# Patient Record
Sex: Female | Born: 1975 | Race: White | Hispanic: No | Marital: Married | Smoking: Never smoker
Health system: Southern US, Community
[De-identification: ages and names within clinical notes are randomized; demographics above are authoritative.]

## PROBLEM LIST (undated history)

## (undated) DIAGNOSIS — H579 Unspecified disorder of eye and adnexa: Secondary | ICD-10-CM

## (undated) DIAGNOSIS — E785 Hyperlipidemia, unspecified: Secondary | ICD-10-CM

## (undated) DIAGNOSIS — D649 Anemia, unspecified: Secondary | ICD-10-CM

## (undated) DIAGNOSIS — W540XXA Bitten by dog, initial encounter: Secondary | ICD-10-CM

## (undated) DIAGNOSIS — R202 Paresthesia of skin: Secondary | ICD-10-CM

## (undated) DIAGNOSIS — R002 Palpitations: Secondary | ICD-10-CM

## (undated) DIAGNOSIS — Z0000 Encounter for general adult medical examination without abnormal findings: Principal | ICD-10-CM

## (undated) DIAGNOSIS — M5090 Cervical disc disorder, unspecified, unspecified cervical region: Secondary | ICD-10-CM

## (undated) DIAGNOSIS — R739 Hyperglycemia, unspecified: Secondary | ICD-10-CM

## (undated) DIAGNOSIS — O24419 Gestational diabetes mellitus in pregnancy, unspecified control: Secondary | ICD-10-CM

## (undated) HISTORY — DX: Bitten by dog, initial encounter: W54.0XXA

## (undated) HISTORY — DX: Paresthesia of skin: R20.2

## (undated) HISTORY — DX: Hyperlipidemia, unspecified: E78.5

## (undated) HISTORY — PX: WISDOM TOOTH EXTRACTION: SHX21

## (undated) HISTORY — DX: Cervical disc disorder, unspecified, unspecified cervical region: M50.90

## (undated) HISTORY — DX: Hyperglycemia, unspecified: R73.9

## (undated) HISTORY — DX: Anemia, unspecified: D64.9

## (undated) HISTORY — DX: Gestational diabetes mellitus in pregnancy, unspecified control: O24.419

## (undated) HISTORY — DX: Palpitations: R00.2

## (undated) HISTORY — DX: Unspecified disorder of eye and adnexa: H57.9

## (undated) HISTORY — DX: Encounter for general adult medical examination without abnormal findings: Z00.00

---

## 2005-11-28 ENCOUNTER — Emergency Department (HOSPITAL_COMMUNITY): Admission: EM | Admit: 2005-11-28 | Discharge: 2005-11-28 | Payer: Self-pay | Admitting: Family Medicine

## 2006-01-12 ENCOUNTER — Inpatient Hospital Stay (HOSPITAL_COMMUNITY): Admission: AD | Admit: 2006-01-12 | Discharge: 2006-01-12 | Payer: Self-pay | Admitting: Obstetrics and Gynecology

## 2006-01-16 ENCOUNTER — Inpatient Hospital Stay (HOSPITAL_COMMUNITY): Admission: RE | Admit: 2006-01-16 | Discharge: 2006-01-19 | Payer: Self-pay | Admitting: Obstetrics and Gynecology

## 2007-06-28 DIAGNOSIS — O24419 Gestational diabetes mellitus in pregnancy, unspecified control: Secondary | ICD-10-CM

## 2007-06-28 HISTORY — DX: Gestational diabetes mellitus in pregnancy, unspecified control: O24.419

## 2007-11-27 ENCOUNTER — Encounter: Admission: RE | Admit: 2007-11-27 | Discharge: 2007-11-27 | Payer: Self-pay | Admitting: Certified Nurse Midwife

## 2008-02-22 ENCOUNTER — Inpatient Hospital Stay (HOSPITAL_COMMUNITY): Admission: AD | Admit: 2008-02-22 | Discharge: 2008-02-24 | Payer: Self-pay | Admitting: Obstetrics and Gynecology

## 2008-07-28 LAB — CONVERTED CEMR LAB: Pap Smear: NORMAL

## 2008-10-28 ENCOUNTER — Ambulatory Visit: Payer: Self-pay | Admitting: Internal Medicine

## 2008-10-28 DIAGNOSIS — J301 Allergic rhinitis due to pollen: Secondary | ICD-10-CM | POA: Insufficient documentation

## 2008-10-28 DIAGNOSIS — O99810 Abnormal glucose complicating pregnancy: Secondary | ICD-10-CM | POA: Insufficient documentation

## 2008-10-28 DIAGNOSIS — Z8742 Personal history of other diseases of the female genital tract: Secondary | ICD-10-CM | POA: Insufficient documentation

## 2008-10-28 DIAGNOSIS — A630 Anogenital (venereal) warts: Secondary | ICD-10-CM | POA: Insufficient documentation

## 2008-10-29 ENCOUNTER — Encounter (INDEPENDENT_AMBULATORY_CARE_PROVIDER_SITE_OTHER): Payer: Self-pay | Admitting: *Deleted

## 2008-10-29 LAB — CONVERTED CEMR LAB
Hgb A1c MFr Bld: 5.6 % (ref 4.6–6.5)
Triglycerides: 30 mg/dL (ref 0.0–149.0)
VLDL: 6 mg/dL (ref 0.0–40.0)

## 2008-12-15 ENCOUNTER — Telehealth (INDEPENDENT_AMBULATORY_CARE_PROVIDER_SITE_OTHER): Payer: Self-pay | Admitting: *Deleted

## 2009-01-06 ENCOUNTER — Telehealth: Payer: Self-pay | Admitting: Internal Medicine

## 2009-01-30 ENCOUNTER — Ambulatory Visit: Payer: Self-pay | Admitting: Internal Medicine

## 2009-01-30 ENCOUNTER — Telehealth (INDEPENDENT_AMBULATORY_CARE_PROVIDER_SITE_OTHER): Payer: Self-pay | Admitting: *Deleted

## 2009-01-30 ENCOUNTER — Telehealth: Payer: Self-pay | Admitting: Infectious Diseases

## 2009-01-30 DIAGNOSIS — L0390 Cellulitis, unspecified: Secondary | ICD-10-CM

## 2009-01-30 DIAGNOSIS — L0291 Cutaneous abscess, unspecified: Secondary | ICD-10-CM | POA: Insufficient documentation

## 2009-01-30 DIAGNOSIS — A4902 Methicillin resistant Staphylococcus aureus infection, unspecified site: Secondary | ICD-10-CM | POA: Insufficient documentation

## 2009-02-02 ENCOUNTER — Ambulatory Visit: Payer: Self-pay | Admitting: Infectious Diseases

## 2010-11-09 NOTE — H&P (Signed)
Sandra Long, Sandra Long                 ACCOUNT NO.:  [id number]   MEDICAL RECORD NO.:  [id number]          PATIENT TYPE:  INP   LOCATION:  9115                          FACILITY:  [facility]   PHYSICIAN:  Lenoard Aden, M.D.DATE OF BIRTH:  March 15, 1976   DATE OF ADMISSION:  02/22/2008  DATE OF DISCHARGE:                              HISTORY & PHYSICAL   CHIEF COMPLAINT:  Labor.   She is a 31-year white female G2, P1 history of previous C-section  breech, who presents with active contractions and cervical change.   Medications include Valtrex and prenatal vitamins.   She is a nonsmoker and nondrinker.  She denies domestic or physical  violence.   She has no known drug allergies.   Family history of hypertension, diabetes, anemia, myeloma, lung cancer,  bladder cancer, and osteoarthritis.  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   Previous history of C-section at 39 weeks planned for a 7 pounds 15  ounces baby due to breech presentation.   PHYSICAL EXAMINATION:  GENERAL:  She is a well-developed, well-nourished  white female in a moderate amount of distress.  HEENT:  Normal.  LUNGS:  Clear.  HEART:  Regular rhythm.  ABDOMEN:  Soft, gravid and nontender.  Estimated fetal weight 8 pounds.  Cervix is 4-5 cm 100% vertex and 0.  EXTREMITIES:  No cords.  NEUROLOGIC:  Nonfocal.  SKIN:  Intact.   Fetal heart rate tracing is reactive, contractions every 2-4 minutes.   IMPRESSION:  1. 40-week intrauterine pregnancy.  2. Previous C-section for trial of labor.  3. 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  4. GBS positive.  5. Fetal arrhythmia for followup postpartum.   PLAN:  Epidural as needed.  Anticipated attempts at vaginal delivery.  Risks, benefits of trial of labor, versus repeat C-section discussed.  Small risk of uterine rupture in labor with possible need  for Pitocin  augmentation.  Note, the patient acknowledged and wishes to proceed.      Lenoard Aden, M.D.  Electronically Signed     RJT/MEDQ  D:  02/22/2008  T:  02/23/2008  Job:  [id number]

## 2010-11-12 NOTE — Discharge Summary (Signed)
Sandra Long, Sandra Long                 ACCOUNT NO.:  [id number]   MEDICAL RECORD NO.:  [id number]          PATIENT TYPE:  INP   LOCATION:  9104                          FACILITY:  [facility]   PHYSICIAN:  Richardean Sale, M.D.   DATE OF BIRTH:  05-29-1976   DATE OF ADMISSION:  01/16/2006  DATE OF DISCHARGE:  01/19/2006                                 DISCHARGE SUMMARY   ADMITTING DIAGNOSIS:  Is 39 week intrauterine pregnancy is frank breech  presentation for primary cesarean delivery.   DISCHARGE DIAGNOSES:  The same, status post cesarean section.   OPERATIVE FINDINGS:  Viable female infant frank breech presentation.  Apgar's  of 9 and 9.  Intact placenta with three-vessel cord.   HOSPITAL COURSE/HISTORY OF PRESENT ILLNESS:  Please see admission history  and physical for details.  Briefly, this is 35 year old gravida 1, para 0  white female who is at 82 weeks plus gestation with a frank breech  presentation.  The patient had been counseled on possibility of external  cephalic version but declined. She underwent uncomplicated cesarean delivery  on 01/16/06 resulting in delivery of viable female infant.  Her postoperative  course was unremarkable.  The patient remained afebrile throughout her  hospitalization. On postop day #3 she was ambulating well.  She was  tolerating a regular diet.  Her pain was controlled with pain medication and  she was subsequently discharged home in good condition.   DISPOSITION:  To home.   CONDITION:  Improved.   FOLLOW UP:  The patient will follow up in 4 weeks for a routine postop  visit.   MEDICATIONS:  1. Percocet 1-2 tabs p.o. q. 4-6 hours p.r.n. pain.  2. Ibuprofen 800 mg p.o. q. 8 h as needed.   LABORATORY STUDIES:  Preop white count 10.3, hemoglobin 12.1, hematocrit  35.3, platelets 201.  Postop day #1, white count 14.9, hemoglobin 11.2,  hematocrit 32.7 and platelets 197.      Richardean Sale, M.D.  Electronically Signed     JW/MEDQ  D:   03/25/2006  T:  03/27/2006  Job:  [id number]

## 2010-11-12 NOTE — Op Note (Signed)
Sandra Long, Sandra Long                 ACCOUNT NO.:  [id number]   MEDICAL RECORD NO.:  [id number]          PATIENT TYPE:  INP   LOCATION:  9104                          FACILITY:  [facility]   PHYSICIAN:  Richardean Sale, M.D.   DATE OF BIRTH:  [birth year]   DATE OF PROCEDURE:  01/16/2006  DATE OF DISCHARGE:                                 OPERATIVE REPORT   PREOPERATIVE DIAGNOSIS:  A 39 week intrauterine pregnancy, frank breech  presentation for primary cesarean delivery.   POSTOPERATIVE DIAGNOSIS:  A 39 week intrauterine pregnancy, frank breech  presentation for primary cesarean delivery.   PROCEDURE:  Primary low transverse cesarean section.   SURGEON:  Richardean Sale, M.D.   ASSISTANT:  Marlinda Mike, C.N.M.   ANESTHESIA:  Spinal   COMPLICATIONS:  None.   ESTIMATED BLOOD LOSS:  600 mL.   FINDINGS:  A viable female infant in frank breech presentation.  Apgars of 9  and 9.  Arterial cord pH 7.27, and intact placenta with 3-vessel cord  delivered spontaneously.  Normal-appearing uterus, tubes, and ovaries.   INDICATIONS:  This is a 2 gravida 1, para 0 white female who is at [redacted] weeks  gestation with a known frank breech presentation who present today for  primary cesarean delivery.  Prior to the procedure the risks, benefits, and  alternatives of the procedure including external cephalic version were  reviewed with the patient in detail.  We discussed the risks which include  but are not limited to hemorrhage requiring transfusion, infection, injury  to the bowel, bladder, or other organs which could require additional  surgery.  The possibility of anesthetic related complications including DVT.  The patient voiced understanding of all of the above and desires to proceed.  Informed consent was then obtained; and all questions answered.   DESCRIPTION OF PROCEDURE:  The patient was taken to the operating room where  she was given a spinal anesthetic.  She was then prepped and draped in  the  usual sterile fashion with Betadine and a Foley catheter was placed 10 mL of  1/2% plain Marcaine were then injected into the area where the incision was  to be made.  A Pfannenstiel skin incision was made with the scalpel.  This  was carried down sharply to the fascia.  The fascia was then incised in the  midline.  The fascial incision was extended laterally with Metzenbaum  scissors.  The superior and inferior aspects of the fascial incision were  then grasped with Kocher clamps, elevated, and the underlying rectus muscles  dissected off with both sharp and blunt dissection.  The muscles were then  separated in the midline.  The peritoneum was identified and entered  sharply.  Peritoneal incision was then extended superiorly and inferiorly  with good visualization of the bladder.  The bladder blade was then  reinserted and the vesicouterine peritoneum was identified; grasped with  pickups, and entered sharply with Metzenbaum scissors.  The bladder flap was  then created with both sharp and blunt dissection.  The bladder blade was  then reinserted and the lower  uterine segment was incised in a transverse  fashion with the scalpel.  Clear amniotic fluid was noted.   The position was frank breech presentation.  The breech was delivered.  The  sacrum was then rotated anteriorly.  The legs were delivered in a flexed  fashion.  The arms were then delivered by sweeping them across the chest;  and the head was then delivered with a finger in the mouth in a flexed  position.  The nose and mouth were then suctioned with the bulb; and the  cord was clamped and cut and the infant was handed off to the waiting NICU  attendants with a vigorous cry.  Apgars were 9 and 9.  Arterial cord gas was  then obtained, cord blood was obtained, and the uterus was then delivered  spontaneously, intact and sent to labor and delivery.  The uterus was then  cleared of all clot and debris; and the uterine  incision was repaired with  running 1-0 chromic in a running locked fashion.  A second layer of the same  suture was then placed in an imbricating fashion for additional hemostasis;  and in the event that the patient would attempt labor in the future.   Once the uterine incision was confirmed as hemostatic, the adnexa were  examined and were normal.  The pelvis was then irrigated copiously with warm  normal saline.  Any areas of bleeding were cauterized with a Bovie.  The  peritoneum was reapproximated using a running chromic suture.  The rectus  muscles and the subfascial surfaces were inspected and any areas of bleeding  were cauterized with the Bovie.  The fascia was then closed with running  Vicryl suture.  The subcutaneous space was then irrigated, again, and any  areas of bleeding were cauterized with a Bovie; and the skin was then closed  with a running 4-0 Vicryl subcuticular stitch, per patient request.  At this  point the procedure was then terminated.  All sponge, lap, needle, and  sponge counts were correct x2.  The patient was taken to recovery room awake  and in stable condition; and there were no complications.      Richardean Sale, M.D.  Electronically Signed     JW/MEDQ  D:  01/16/2006  T:  01/16/2006  Job:  [id number]

## 2012-10-22 ENCOUNTER — Other Ambulatory Visit (HOSPITAL_COMMUNITY): Payer: Self-pay | Admitting: Neurological Surgery

## 2012-11-01 ENCOUNTER — Other Ambulatory Visit (HOSPITAL_COMMUNITY): Payer: Self-pay | Admitting: Neurological Surgery

## 2012-11-01 DIAGNOSIS — M542 Cervicalgia: Secondary | ICD-10-CM

## 2012-11-05 ENCOUNTER — Ambulatory Visit (HOSPITAL_COMMUNITY)
Admission: RE | Admit: 2012-11-05 | Discharge: 2012-11-05 | Disposition: A | Discharge status: Home / Self Care | Payer: BC Managed Care – PPO | Source: Ambulatory Visit | Attending: Neurological Surgery | Admitting: Neurological Surgery

## 2012-11-05 DIAGNOSIS — M129 Arthropathy, unspecified: Secondary | ICD-10-CM | POA: Insufficient documentation

## 2012-11-05 DIAGNOSIS — IMO0002 Reserved for concepts with insufficient information to code with codable children: Secondary | ICD-10-CM | POA: Insufficient documentation

## 2012-11-05 DIAGNOSIS — M4802 Spinal stenosis, cervical region: Secondary | ICD-10-CM | POA: Insufficient documentation

## 2012-11-05 DIAGNOSIS — M25519 Pain in unspecified shoulder: Secondary | ICD-10-CM | POA: Insufficient documentation

## 2012-11-05 DIAGNOSIS — M542 Cervicalgia: Secondary | ICD-10-CM | POA: Insufficient documentation

## 2012-11-05 DIAGNOSIS — J322 Chronic ethmoidal sinusitis: Secondary | ICD-10-CM | POA: Insufficient documentation

## 2014-10-27 ENCOUNTER — Telehealth: Payer: Self-pay | Admitting: Neurological Surgery

## 2014-10-27 NOTE — Telephone Encounter (Signed)
Patient informed of PCP instructions regarding labs

## 2014-10-27 NOTE — Telephone Encounter (Signed)
Caller Name: Scottlyn Mchaney Relation to Patient: self Phone #: [phone]  Reason for Call: Pt called to est as new pt with Dr. Charlett Blake. She stated she would like to have full labs done when she comes. She states she has some concern for A1C and cholesterol. She is scheduled for 11/25/14 at 11:00am. She has no problem fasting so that all labs can be done. Please advise. Thank you.

## 2014-10-27 NOTE — Telephone Encounter (Signed)
No problem doing labs when she comes as long as her appt is not in the evening. I can order but sometimes insurance will kick back unless she has a history of hi sugar.

## 2014-11-21 ENCOUNTER — Encounter: Payer: Self-pay | Admitting: *Deleted

## 2014-11-21 ENCOUNTER — Telehealth: Payer: Self-pay | Admitting: *Deleted

## 2014-11-21 NOTE — Telephone Encounter (Signed)
Pre-Visit Call completed with patient and chart updated.   Pre-Visit Info documented in Specialty Comments under [employer].    

## 2014-11-25 ENCOUNTER — Encounter: Payer: Self-pay | Admitting: Family Medicine

## 2014-11-25 ENCOUNTER — Ambulatory Visit (INDEPENDENT_AMBULATORY_CARE_PROVIDER_SITE_OTHER): Payer: BLUE CROSS/BLUE SHIELD | Admitting: Family Medicine

## 2014-11-25 VITALS — BP 108/62 | HR 82 | Temp 98.9°F | Ht 63.5 in | Wt 120.1 lb

## 2014-11-25 DIAGNOSIS — R258 Other abnormal involuntary movements: Secondary | ICD-10-CM

## 2014-11-25 DIAGNOSIS — Z0000 Encounter for general adult medical examination without abnormal findings: Secondary | ICD-10-CM | POA: Diagnosis not present

## 2014-11-25 DIAGNOSIS — M5090 Cervical disc disorder, unspecified, unspecified cervical region: Secondary | ICD-10-CM

## 2014-11-25 DIAGNOSIS — H579 Unspecified disorder of eye and adnexa: Secondary | ICD-10-CM

## 2014-11-25 DIAGNOSIS — R202 Paresthesia of skin: Secondary | ICD-10-CM | POA: Diagnosis not present

## 2014-11-25 DIAGNOSIS — Z832 Family history of diseases of the blood and blood-forming organs and certain disorders involving the immune mechanism: Secondary | ICD-10-CM | POA: Diagnosis not present

## 2014-11-25 DIAGNOSIS — R251 Tremor, unspecified: Secondary | ICD-10-CM

## 2014-11-25 DIAGNOSIS — Z8632 Personal history of gestational diabetes: Secondary | ICD-10-CM

## 2014-11-25 DIAGNOSIS — R002 Palpitations: Secondary | ICD-10-CM

## 2014-11-25 HISTORY — DX: Cervical disc disorder, unspecified, unspecified cervical region: M50.90

## 2014-11-25 HISTORY — DX: Palpitations: R00.2

## 2014-11-25 HISTORY — DX: Paresthesia of skin: R20.2

## 2014-11-25 LAB — CBC
HEMATOCRIT: 39.4 % (ref 36.0–46.0)
HEMOGLOBIN: 13.1 g/dL (ref 12.0–15.0)
MCHC: 33.2 g/dL (ref 30.0–36.0)
MCV: 92.6 fl (ref 78.0–100.0)
PLATELETS: 185 10*3/uL (ref 150.0–400.0)
RBC: 4.26 Mil/uL (ref 3.87–5.11)
RDW: 13.7 % (ref 11.5–15.5)
WBC: 6.9 10*3/uL (ref 4.0–10.5)

## 2014-11-25 LAB — COMPREHENSIVE METABOLIC PANEL
ALBUMIN: 4.3 g/dL (ref 3.5–5.2)
ALT: 23 U/L (ref 0–35)
AST: 24 U/L (ref 0–37)
Alkaline Phosphatase: 31 U/L — ABNORMAL LOW (ref 39–117)
BILIRUBIN TOTAL: 0.6 mg/dL (ref 0.2–1.2)
BUN: 9 mg/dL (ref 6–23)
CHLORIDE: 104 meq/L (ref 96–112)
CO2: 30 meq/L (ref 19–32)
CREATININE: 0.53 mg/dL (ref 0.40–1.20)
Calcium: 9.4 mg/dL (ref 8.4–10.5)
GFR: 136.91 mL/min (ref >60.00)
Glucose, Bld: 87 mg/dL (ref 70–99)
Potassium: 4.5 mEq/L (ref 3.5–5.1)
Sodium: 139 mEq/L (ref 135–145)
TOTAL PROTEIN: 6.6 g/dL (ref 6.0–8.3)

## 2014-11-25 LAB — LIPID PANEL
CHOL/HDL RATIO: 3
Cholesterol: 135 mg/dL (ref 0–200)
HDL: 49.2 mg/dL (ref >39.00)
LDL CALC: 79 mg/dL (ref 0–99)
NONHDL: 85.8
Triglycerides: 35 mg/dL (ref 0.0–149.0)
VLDL: 7 mg/dL (ref 0.0–40.0)

## 2014-11-25 LAB — TSH: TSH: 1.5 u[IU]/mL (ref 0.35–4.50)

## 2014-11-25 LAB — VITAMIN B12: Vitamin B-12: 655 pg/mL (ref 211–911)

## 2014-11-25 LAB — HEMOGLOBIN A1C: Hgb A1c MFr Bld: 5.2 % (ref 4.6–6.5)

## 2014-11-25 MED ORDER — ALPRAZOLAM 0.25 MG PO TABS: 0.2500 mg | ORAL_TABLET | Freq: Two times a day (BID) | ORAL | Status: DC | PRN

## 2014-11-25 NOTE — Patient Instructions (Signed)
Preventive Care for Adults A healthy lifestyle and preventive care can promote health and wellness. Preventive health guidelines for women include the following key practices.  A routine yearly physical is a good way to check with your health care provider about your health and preventive screening. It is a chance to share any concerns and updates on your health and to receive a thorough exam.  Visit your dentist for a routine exam and preventive care every 6 months. Brush your teeth twice a day and floss once a day. Good oral hygiene prevents tooth decay and gum disease.  The frequency of eye exams is based on your age, health, family medical history, use of contact lenses, and other factors. Follow your health care provider's recommendations for frequency of eye exams.  Eat a healthy diet. Foods like vegetables, fruits, whole grains, low-fat dairy products, and lean protein foods contain the nutrients you need without too many calories. Decrease your intake of foods high in solid fats, added sugars, and salt. Eat the right amount of calories for you.Get information about a proper diet from your health care provider, if necessary.  Regular physical exercise is one of the most important things you can do for your health. Most adults should get at least 150 minutes of moderate-intensity exercise (any activity that increases your heart rate and causes you to sweat) each week. In addition, most adults need muscle-strengthening exercises on 2 or more days a week.  Maintain a healthy weight. The body mass index (BMI) is a screening tool to identify possible weight problems. It provides an estimate of body fat based on height and weight. Your health care provider can find your BMI and can help you achieve or maintain a healthy weight.For adults 20 years and older:  A BMI below 18.5 is considered underweight.  A BMI of 18.5 to 24.9 is normal.  A BMI of 25 to 29.9 is considered overweight.  A BMI of  30 and above is considered obese.  Maintain normal blood lipids and cholesterol levels by exercising and minimizing your intake of saturated fat. Eat a balanced diet with plenty of fruit and vegetables. Blood tests for lipids and cholesterol should begin at age 20 and be repeated every 5 years. If your lipid or cholesterol levels are high, you are over 50, or you are at high risk for heart disease, you may need your cholesterol levels checked more frequently.Ongoing high lipid and cholesterol levels should be treated with medicines if diet and exercise are not working.  If you smoke, find out from your health care provider how to quit. If you do not use tobacco, do not start.  Lung cancer screening is recommended for adults aged 55-80 years who are at high risk for developing lung cancer because of a history of smoking. A yearly low-dose CT scan of the lungs is recommended for people who have at least a 30-pack-year history of smoking and are a current smoker or have quit within the past 15 years. A pack year of smoking is smoking an average of 1 pack of cigarettes a day for 1 year (for example: 1 pack a day for 30 years or 2 packs a day for 15 years). Yearly screening should continue until the smoker has stopped smoking for at least 15 years. Yearly screening should be stopped for people who develop a health problem that would prevent them from having lung cancer treatment.  If you are pregnant, do not drink alcohol. If you are breastfeeding,   breastfeeding, be very cautious about drinking alcohol. If you are not pregnant and choose to drink alcohol, do not have more than 1 drink per day. One drink is considered to be 12 ounces (355 mL) of beer, 5 ounces (148 mL) of wine, or 1.5 ounces (44 mL) of liquor.  Avoid use of street drugs. Do not share needles with anyone. Ask for help if you need support or instructions about stopping the use of drugs.  High blood pressure causes heart disease and increases the risk of  stroke. Your blood pressure should be checked at least every 1 to 2 years. Ongoing high blood pressure should be treated with medicines if weight loss and exercise do not work.  If you are 3-31 years old, ask your health care provider if you should take aspirin to prevent strokes.  Diabetes screening involves taking a blood sample to check your fasting blood sugar level. This should be done once every 3 years, after age 31, if you are within normal weight and without risk factors for diabetes. Testing should be considered at a younger age or be carried out more frequently if you are overweight and have at least 1 risk factor for diabetes.  Breast cancer screening is essential preventive care for women. You should practice "breast self-awareness." This means understanding the normal appearance and feel of your breasts and may include breast self-examination. Any changes detected, no matter how small, should be reported to a health care provider. Women in their 76s and 30s should have a clinical breast exam (CBE) by a health care provider as part of a regular health exam every 1 to 3 years. After age 65, women should have a CBE every year. Starting at age 67, women should consider having a mammogram (breast X-ray test) every year. Women who have a family history of breast cancer should talk to their health care provider about genetic screening. Women at a high risk of breast cancer should talk to their health care providers about having an MRI and a mammogram every year.  Breast cancer gene (BRCA)-related cancer risk assessment is recommended for women who have family members with BRCA-related cancers. BRCA-related cancers include breast, ovarian, tubal, and peritoneal cancers. Having family members with these cancers may be associated with an increased risk for harmful changes (mutations) in the breast cancer genes BRCA1 and BRCA2. Results of the assessment will determine the need for genetic counseling and  BRCA1 and BRCA2 testing.  Routine pelvic exams to screen for cancer are no longer recommended for nonpregnant women who are considered low risk for cancer of the pelvic organs (ovaries, uterus, and vagina) and who do not have symptoms. Ask your health care provider if a screening pelvic exam is right for you.  If you have had past treatment for cervical cancer or a condition that could lead to cancer, you need Pap tests and screening for cancer for at least 20 years after your treatment. If Pap tests have been discontinued, your risk factors (such as having a new sexual partner) need to be reassessed to determine if screening should be resumed. Some women have medical problems that increase the chance of getting cervical cancer. In these cases, your health care provider may recommend more frequent screening and Pap tests.  The HPV test is an additional test that may be used for cervical cancer screening. The HPV test looks for the virus that can cause the cell changes on the cervix. The cells collected during the Pap test can  be tested for HPV. The HPV test could be used to screen women aged 98 years and older, and should be used in women of any age who have unclear Pap test results. After the age of 59, women should have HPV testing at the same frequency as a Pap test.  Colorectal cancer can be detected and often prevented. Most routine colorectal cancer screening begins at the age of 35 years and continues through age 61 years. However, your health care provider may recommend screening at an earlier age if you have risk factors for colon cancer. On a yearly basis, your health care provider may provide home test kits to check for hidden blood in the stool. Use of a small camera at the end of a tube, to directly examine the colon (sigmoidoscopy or colonoscopy), can detect the earliest forms of colorectal cancer. Talk to your health care provider about this at age 18, when routine screening begins. Direct  exam of the colon should be repeated every 5-10 years through age 67 years, unless early forms of pre-cancerous polyps or small growths are found.  People who are at an increased risk for hepatitis B should be screened for this virus. You are considered at high risk for hepatitis B if:  You were born in a country where hepatitis B occurs often. Talk with your health care provider about which countries are considered high risk.  Your parents were born in a high-risk country and you have not received a shot to protect against hepatitis B (hepatitis B vaccine).  You have HIV or AIDS.  You use needles to inject street drugs.  You live with, or have sex with, someone who has hepatitis B.  You get hemodialysis treatment.  You take certain medicines for conditions like cancer, organ transplantation, and autoimmune conditions.  Hepatitis C blood testing is recommended for all people born from [birth year] through 1965 and any individual with known risks for hepatitis C.  Practice safe sex. Use condoms and avoid high-risk sexual practices to reduce the spread of sexually transmitted infections (STIs). STIs include gonorrhea, chlamydia, syphilis, trichomonas, herpes, HPV, and human immunodeficiency virus (HIV). Herpes, HIV, and HPV are viral illnesses that have no cure. They can result in disability, cancer, and death.  You should be screened for sexually transmitted illnesses (STIs) including gonorrhea and chlamydia if:  You are sexually active and are younger than 24 years.  You are older than 24 years and your health care provider tells you that you are at risk for this type of infection.  Your sexual activity has changed since you were last screened and you are at an increased risk for chlamydia or gonorrhea. Ask your health care provider if you are at risk.  If you are at risk of being infected with HIV, it is recommended that you take a prescription medicine daily to prevent HIV infection. This is  called preexposure prophylaxis (PrEP). You are considered at risk if:  You are a heterosexual woman, are sexually active, and are at increased risk for HIV infection.  You take drugs by injection.  You are sexually active with a partner who has HIV.  Talk with your health care provider about whether you are at high risk of being infected with HIV. If you choose to begin PrEP, you should first be tested for HIV. You should then be tested every 3 months for as long as you are taking PrEP.  Osteoporosis is a disease in which the bones lose minerals and  strength with aging. This can result in serious bone fractures or breaks. The risk of osteoporosis can be identified using a bone density scan. Women ages 48 years and over and women at risk for fractures or osteoporosis should discuss screening with their health care providers. Ask your health care provider whether you should take a calcium supplement or vitamin D to reduce the rate of osteoporosis.  Menopause can be associated with physical symptoms and risks. Hormone replacement therapy is available to decrease symptoms and risks. You should talk to your health care provider about whether hormone replacement therapy is right for you.  Use sunscreen. Apply sunscreen liberally and repeatedly throughout the day. You should seek shade when your shadow is shorter than you. Protect yourself by wearing long sleeves, pants, a wide-brimmed hat, and sunglasses year round, whenever you are outdoors.  Once a month, do a whole body skin exam, using a mirror to look at the skin on your back. Tell your health care provider of new moles, moles that have irregular borders, moles that are larger than a pencil eraser, or moles that have changed in shape or color.  Stay current with required vaccines (immunizations).  Influenza vaccine. All adults should be immunized every year.  Tetanus, diphtheria, and acellular pertussis (Td, Tdap) vaccine. Pregnant women should  receive 1 dose of Tdap vaccine during each pregnancy. The dose should be obtained regardless of the length of time since the last dose. Immunization is preferred during the 27th-36th week of gestation. An adult who has not previously received Tdap or who does not know her vaccine status should receive 1 dose of Tdap. This initial dose should be followed by tetanus and diphtheria toxoids (Td) booster doses every 10 years. Adults with an unknown or incomplete history of completing a 3-dose immunization series with Td-containing vaccines should begin or complete a primary immunization series including a Tdap dose. Adults should receive a Td booster every 10 years.  Varicella vaccine. An adult without evidence of immunity to varicella should receive 2 doses or a second dose if she has previously received 1 dose. Pregnant females who do not have evidence of immunity should receive the first dose after pregnancy. This first dose should be obtained before leaving the health care facility. The second dose should be obtained 4-8 weeks after the first dose.  Human papillomavirus (HPV) vaccine. Females aged 13-26 years who have not received the vaccine previously should obtain the 3-dose series. The vaccine is not recommended for use in pregnant females. However, pregnancy testing is not needed before receiving a dose. If a female is found to be pregnant after receiving a dose, no treatment is needed. In that case, the remaining doses should be delayed until after the pregnancy. Immunization is recommended for any person with an immunocompromised condition through the age of 48 years if she did not get any or all doses earlier. During the 3-dose series, the second dose should be obtained 4-8 weeks after the first dose. The third dose should be obtained 24 weeks after the first dose and 16 weeks after the second dose.  Zoster vaccine. One dose is recommended for adults aged 67 years or older unless certain conditions are  present.  Measles, mumps, and rubella (MMR) vaccine. Adults born before [birth year] generally are considered immune to measles and mumps. Adults born in [birth year] or later should have 1 or more doses of MMR vaccine unless there is a contraindication to the vaccine or there is laboratory evidence of immunity  to each of the three diseases. A routine second dose of MMR vaccine should be obtained at least 28 days after the first dose for students attending postsecondary schools, health care workers, or international travelers. People who received inactivated measles vaccine or an unknown type of measles vaccine during 1963-1967 should receive 2 doses of MMR vaccine. People who received inactivated mumps vaccine or an unknown type of mumps vaccine before 1979 and are at high risk for mumps infection should consider immunization with 2 doses of MMR vaccine. For females of childbearing age, rubella immunity should be determined. If there is no evidence of immunity, females who are not pregnant should be vaccinated. If there is no evidence of immunity, females who are pregnant should delay immunization until after pregnancy. Unvaccinated health care workers born before [birth year] who lack laboratory evidence of measles, mumps, or rubella immunity or laboratory confirmation of disease should consider measles and mumps immunization with 2 doses of MMR vaccine or rubella immunization with 1 dose of MMR vaccine.  Pneumococcal 13-valent conjugate (PCV13) vaccine. When indicated, a person who is uncertain of her immunization history and has no record of immunization should receive the PCV13 vaccine. An adult aged 58 years or older who has certain medical conditions and has not been previously immunized should receive 1 dose of PCV13 vaccine. This PCV13 should be followed with a dose of pneumococcal polysaccharide (PPSV23) vaccine. The PPSV23 vaccine dose should be obtained at least 8 weeks after the dose of PCV13 vaccine. An adult aged 65  years or older who has certain medical conditions and previously received 1 or more doses of PPSV23 vaccine should receive 1 dose of PCV13. The PCV13 vaccine dose should be obtained 1 or more years after the last PPSV23 vaccine dose.  Pneumococcal polysaccharide (PPSV23) vaccine. When PCV13 is also indicated, PCV13 should be obtained first. All adults aged 18 years and older should be immunized. An adult younger than age 74 years who has certain medical conditions should be immunized. Any person who resides in a nursing home or long-term care facility should be immunized. An adult smoker should be immunized. People with an immunocompromised condition and certain other conditions should receive both PCV13 and PPSV23 vaccines. People with human immunodeficiency virus (HIV) infection should be immunized as soon as possible after diagnosis. Immunization during chemotherapy or radiation therapy should be avoided. Routine use of PPSV23 vaccine is not recommended for American Indians, [place] Natives, or people younger than 65 years unless there are medical conditions that require PPSV23 vaccine. When indicated, people who have unknown immunization and have no record of immunization should receive PPSV23 vaccine. One-time revaccination 5 years after the first dose of PPSV23 is recommended for people aged 19-64 years who have chronic kidney failure, nephrotic syndrome, asplenia, or immunocompromised conditions. People who received 1-2 doses of PPSV23 before age 21 years should receive another dose of PPSV23 vaccine at age 42 years or later if at least 5 years have passed since the previous dose. Doses of PPSV23 are not needed for people immunized with PPSV23 at or after age 61 years.  Meningococcal vaccine. Adults with asplenia or persistent complement component deficiencies should receive 2 doses of quadrivalent meningococcal conjugate (MenACWY-D) vaccine. The doses should be obtained at least 2 months apart.  Microbiologists working with certain meningococcal bacteria, [employer] recruits, people at risk during an outbreak, and people who travel to or live in countries with a high rate of meningitis should be immunized. A first-year college student up through  age 18 years who is living in a residence hall should receive a dose if she did not receive a dose on or after her 16th birthday. Adults who have certain high-risk conditions should receive one or more doses of vaccine.  Hepatitis A vaccine. Adults who wish to be protected from this disease, have certain high-risk conditions, work with hepatitis A-infected animals, work in hepatitis A research labs, or travel to or work in countries with a high rate of hepatitis A should be immunized. Adults who were previously unvaccinated and who anticipate close contact with an international adoptee during the first 60 days after arrival in the [place] from a country with a high rate of hepatitis A should be immunized.  Hepatitis B vaccine. Adults who wish to be protected from this disease, have certain high-risk conditions, may be exposed to blood or other infectious body fluids, are household contacts or sex partners of hepatitis B positive people, are clients or workers in certain care facilities, or travel to or work in countries with a high rate of hepatitis B should be immunized.  Haemophilus influenzae type b (Hib) vaccine. A previously unvaccinated person with asplenia or sickle cell disease or having a scheduled splenectomy should receive 1 dose of Hib vaccine. Regardless of previous immunization, a recipient of a hematopoietic stem cell transplant should receive a 3-dose series 6-12 months after her successful transplant. Hib vaccine is not recommended for adults with HIV infection. Preventive Services / Frequency Ages 27 to 79 years  Blood pressure check.** / Every 1 to 2 years.  Lipid and cholesterol check.** / Every 5 years beginning at age  67.  Clinical breast exam.** / Every 3 years for women in their 74s and 58s.  BRCA-related cancer risk assessment.** / For women who have family members with a BRCA-related cancer (breast, ovarian, tubal, or peritoneal cancers).  Pap test.** / Every 2 years from ages 44 through 27. Every 3 years starting at age 60 through age 16 or 79 with a history of 3 consecutive normal Pap tests.  HPV screening.** / Every 3 years from ages 8 through ages 63 to 77 with a history of 3 consecutive normal Pap tests.  Hepatitis C blood test.** / For any individual with known risks for hepatitis C.  Skin self-exam. / Monthly.  Influenza vaccine. / Every year.  Tetanus, diphtheria, and acellular pertussis (Tdap, Td) vaccine.** / Consult your health care provider. Pregnant women should receive 1 dose of Tdap vaccine during each pregnancy. 1 dose of Td every 10 years.  Varicella vaccine.** / Consult your health care provider. Pregnant females who do not have evidence of immunity should receive the first dose after pregnancy.  HPV vaccine. / 3 doses over 6 months, if 60 and younger. The vaccine is not recommended for use in pregnant females. However, pregnancy testing is not needed before receiving a dose.  Measles, mumps, rubella (MMR) vaccine.** / You need at least 1 dose of MMR if you were born in 1957 or later. You may also need a 2nd dose. For females of childbearing age, rubella immunity should be determined. If there is no evidence of immunity, females who are not pregnant should be vaccinated. If there is no evidence of immunity, females who are pregnant should delay immunization until after pregnancy.  Pneumococcal 13-valent conjugate (PCV13) vaccine.** / Consult your health care provider.  Pneumococcal polysaccharide (PPSV23) vaccine.** / 1 to 2 doses if you smoke cigarettes or if you have certain conditions.  Meningococcal  1 dose if you are age 19 to 21 years and a first-year college  student living in a residence hall, or have one of several medical conditions, you need to get vaccinated against meningococcal disease. You may also need additional booster doses.  Hepatitis A vaccine.** / Consult your health care provider.  Hepatitis B vaccine.** / Consult your health care provider.  Haemophilus influenzae type b (Hib) vaccine.** / Consult your health care provider. Ages 40 to 64 years  Blood pressure check.** / Every 1 to 2 years.  Lipid and cholesterol check.** / Every 5 years beginning at age 20 years.  Lung cancer screening. / Every year if you are aged 55-80 years and have a 30-pack-year history of smoking and currently smoke or have quit within the past 15 years. Yearly screening is stopped once you have quit smoking for at least 15 years or develop a health problem that would prevent you from having lung cancer treatment.  Clinical breast exam.** / Every year after age 40 years.  BRCA-related cancer risk assessment.** / For women who have family members with a BRCA-related cancer (breast, ovarian, tubal, or peritoneal cancers).  Mammogram.** / Every year beginning at age 40 years and continuing for as long as you are in good health. Consult with your health care provider.  Pap test.** / Every 3 years starting at age 30 years through age 65 or 70 years with a history of 3 consecutive normal Pap tests.  HPV screening.** / Every 3 years from ages 30 years through ages 65 to 70 years with a history of 3 consecutive normal Pap tests.  Fecal occult blood test (FOBT) of stool. / Every year beginning at age 50 years and continuing until age 75 years. You may not need to do this test if you get a colonoscopy every 10 years.  Flexible sigmoidoscopy or colonoscopy.** / Every 5 years for a flexible sigmoidoscopy or every 10 years for a colonoscopy beginning at age 50 years and continuing until age 75 years.  Hepatitis C blood test.** / For all people born from 1945 through  1965 and any individual with known risks for hepatitis C.  Skin self-exam. / Monthly.  Influenza vaccine. / Every year.  Tetanus, diphtheria, and acellular pertussis (Tdap/Td) vaccine.** / Consult your health care provider. Pregnant women should receive 1 dose of Tdap vaccine during each pregnancy. 1 dose of Td every 10 years.  Varicella vaccine.** / Consult your health care provider. Pregnant females who do not have evidence of immunity should receive the first dose after pregnancy.  Zoster vaccine.** / 1 dose for adults aged 60 years or older.  Measles, mumps, rubella (MMR) vaccine.** / You need at least 1 dose of MMR if you were born in 1957 or later. You may also need a 2nd dose. For females of childbearing age, rubella immunity should be determined. If there is no evidence of immunity, females who are not pregnant should be vaccinated. If there is no evidence of immunity, females who are pregnant should delay immunization until after pregnancy.  Pneumococcal 13-valent conjugate (PCV13) vaccine.** / Consult your health care provider.  Pneumococcal polysaccharide (PPSV23) vaccine.** / 1 to 2 doses if you smoke cigarettes or if you have certain conditions.  Meningococcal vaccine.** / Consult your health care provider.  Hepatitis A vaccine.** / Consult your health care provider.  Hepatitis B vaccine.** / Consult your health care provider.  Haemophilus influenzae type b (Hib) vaccine.** / Consult your health care provider. Ages 65   years and over  Blood pressure check.** / Every 1 to 2 years.  Lipid and cholesterol check.** / Every 5 years beginning at age 20 years.  Lung cancer screening. / Every year if you are aged 55-80 years and have a 30-pack-year history of smoking and currently smoke or have quit within the past 15 years. Yearly screening is stopped once you have quit smoking for at least 15 years or develop a health problem that would prevent you from having lung cancer  treatment.  Clinical breast exam.** / Every year after age 40 years.  BRCA-related cancer risk assessment.** / For women who have family members with a BRCA-related cancer (breast, ovarian, tubal, or peritoneal cancers).  Mammogram.** / Every year beginning at age 40 years and continuing for as long as you are in good health. Consult with your health care provider.  Pap test.** / Every 3 years starting at age 30 years through age 65 or 70 years with 3 consecutive normal Pap tests. Testing can be stopped between 65 and 70 years with 3 consecutive normal Pap tests and no abnormal Pap or HPV tests in the past 10 years.  HPV screening.** / Every 3 years from ages 30 years through ages 65 or 70 years with a history of 3 consecutive normal Pap tests. Testing can be stopped between 65 and 70 years with 3 consecutive normal Pap tests and no abnormal Pap or HPV tests in the past 10 years.  Fecal occult blood test (FOBT) of stool. / Every year beginning at age 50 years and continuing until age 75 years. You may not need to do this test if you get a colonoscopy every 10 years.  Flexible sigmoidoscopy or colonoscopy.** / Every 5 years for a flexible sigmoidoscopy or every 10 years for a colonoscopy beginning at age 50 years and continuing until age 75 years.  Hepatitis C blood test.** / For all people born from 1945 through 1965 and any individual with known risks for hepatitis C.  Osteoporosis screening.** / A one-time screening for women ages 65 years and over and women at risk for fractures or osteoporosis.  Skin self-exam. / Monthly.  Influenza vaccine. / Every year.  Tetanus, diphtheria, and acellular pertussis (Tdap/Td) vaccine.** / 1 dose of Td every 10 years.  Varicella vaccine.** / Consult your health care provider.  Zoster vaccine.** / 1 dose for adults aged 60 years or older.  Pneumococcal 13-valent conjugate (PCV13) vaccine.** / Consult your health care provider.  Pneumococcal  polysaccharide (PPSV23) vaccine.** / 1 dose for all adults aged 65 years and older.  Meningococcal vaccine.** / Consult your health care provider.  Hepatitis A vaccine.** / Consult your health care provider.  Hepatitis B vaccine.** / Consult your health care provider.  Haemophilus influenzae type b (Hib) vaccine.** / Consult your health care provider. ** Family history and personal history of risk and conditions may change your health care provider's recommendations. Document Released: 08/09/2001 Document Revised: 10/28/2013 Document Reviewed: 11/08/2010 Exit[employer] Patient Information 2015 [employer]. This information is not intended to replace advice given to you by your health care provider. Make sure you discuss any questions you have with your health care provider.  

## 2014-11-25 NOTE — Assessment & Plan Note (Addendum)
1. Moderate left central and foraminal stenosis due to a large left lateral recess disc protrusion at C6-7, potentially with a small amount of marginal blood products - correlate with chronicity of onset of patient's symptoms. 2. There is also moderate right eccentric central stenosis at the C5-6 level due to a right paracentral disc protrusion. 3. Chronic ethmoid sinusitis. 4. Mild reversal normal cervical lordosis, possibly from muscle Spasm. Struggles with chronic pain. Encouraged moist heat and gentle stretching as tolerated. May try NSAIDs and prescription meds as directed and report if symptoms worsen or seek immediate care  via MRI 2014

## 2014-11-25 NOTE — Assessment & Plan Note (Signed)
Previous A1C of 5.6

## 2014-11-25 NOTE — Progress Notes (Signed)
Pre visit review using our clinic review tool, if applicable. No additional management support is needed unless otherwise documented below in the visit note. 

## 2014-11-25 NOTE — Assessment & Plan Note (Signed)
Check TSH 

## 2014-12-07 ENCOUNTER — Encounter: Payer: Self-pay | Admitting: Family Medicine

## 2014-12-07 DIAGNOSIS — Z832 Family history of diseases of the blood and blood-forming organs and certain disorders involving the immune mechanism: Secondary | ICD-10-CM | POA: Insufficient documentation

## 2014-12-07 DIAGNOSIS — H579 Unspecified disorder of eye and adnexa: Secondary | ICD-10-CM

## 2014-12-07 DIAGNOSIS — R251 Tremor, unspecified: Secondary | ICD-10-CM | POA: Insufficient documentation

## 2014-12-07 DIAGNOSIS — Z0000 Encounter for general adult medical examination without abnormal findings: Secondary | ICD-10-CM | POA: Insufficient documentation

## 2014-12-07 HISTORY — DX: Unspecified disorder of eye and adnexa: H57.9

## 2014-12-07 HISTORY — DX: Encounter for general adult medical examination without abnormal findings: Z00.00

## 2014-12-07 NOTE — Progress Notes (Signed)
Sandra Long  [id number] July 15, 1975 12/07/2014      Progress Note-Follow Up  Subjective  Chief Complaint  Chief Complaint  Patient presents with  . Establish Care    HPI  Patient is a 39 y.o. female in today for routine medical care. Patient is in today to establish care. Struggles with chronic neck pain but manages on a daily basis without significant meds. Works as a Equities trader in the colon system. No recent illness. Continues to follow with OB/GYN. Has intermittent trouble palpitations. No associated symptoms. Denies CP/palp/SOB/HA/congestion/fevers/GI or GU c/o. Taking meds as prescribed  Past Medical History  Diagnosis Date  . Gestational diabetes 2009  . Paresthesia 11/25/2014  . Cervical disc disease 11/25/2014    MRI 2014  1. Moderate left central and foraminal stenosis due to a large left lateral recess disc protrusion at C6-7, potentially with a small amount of marginal blood products - correlate with chronicity of onset of patient's symptoms. 2. There is also moderate right eccentric central stenosis at the C5-6 level due to a right paracentral disc protrusion. 3. Chronic ethmoid sinusitis. 4. Mild   . Palpitations 11/25/2014  . Preventative health care 12/07/2014    Past Surgical History  Procedure Laterality Date  . Cesarean section  2007  . Wisdom tooth extraction      Family History  Problem Relation Age of Onset  . Heart disease Mother     2 stents 2015  . Tremor Mother     benign essential  . Heart disease Father     stents  . Diabetes Father     28  . Stroke Father   . Gout Father   . Gout Brother   . Cancer Maternal Grandfather     Multiple Myeloma  . Stroke Paternal Grandmother   . Cancer Paternal Grandfather     bladder cancer    History   Social History  . Marital Status: Married    Spouse Name: N/A  . Number of Children: N/A  . Years of Education: N/A   Occupational History  . RN [employer]    Social History Main Topics  . Smoking status: Never Smoker   . Smokeless tobacco: Not on file  . Alcohol Use: 0.0 oz/week    0 Standard drinks or equivalent per week  . Drug Use: No  . Sexual Activity: Not on file     Comment: lives with husband, no red meat, works as Therapist, sports at [employer]   Other Topics Concern  . Not on file   Social History Narrative    Current Outpatient Prescriptions on File Prior to Visit  Medication Sig Dispense Refill  . Magnesium 200 MG TABS Take 400 mg by mouth daily.    . Multiple Vitamins-Minerals (MULTIVITAMIN ADULT PO) Take 1 tablet by mouth daily.    . Vitamin D, Cholecalciferol, 1000 UNITS TABS Take 2,000 Units by mouth daily.     No current facility-administered medications on file prior to visit.    No Known Allergies    Immunization Status: Flu vaccine--03/27/18 per patient  Tdap-- 2009 per patient  PNA-- NA  Shingles-- NA   A/P:  Changes to FH, PSH or Personal Hx: personal history of gestational diabetes and c-section. Family history of cardiac disease with stents placed in both parents  Pap-- 09/2013  MMG-- NA  Bone Density-- NA  CCS-- NA   Care Teams Updated: Brien Few, MD- OBGYN  ED/Hospital/Urgent Care Visits:  None recent   To Discuss with Provider: Patient would like A1C drawn. She states that her mother has B12 injections and she is interested in getting this level checked. Review of Systems  Review of Systems  Constitutional: Negative for fever, chills and malaise/fatigue.  HENT: Negative for congestion, hearing loss and nosebleeds.   Eyes: Negative for discharge.  Respiratory: Negative for cough, sputum production, shortness of breath and wheezing.   Cardiovascular: Negative for chest pain, palpitations and leg swelling.  Gastrointestinal: Negative for heartburn, nausea, vomiting, abdominal pain, diarrhea, constipation and blood in stool.  Genitourinary: Negative for dysuria, urgency, frequency and hematuria.    Musculoskeletal: Positive for back pain. Negative for myalgias and falls.  Skin: Negative for rash.  Neurological: Negative for dizziness, tremors, sensory change, focal weakness, loss of consciousness, weakness and headaches.  Endo/Heme/Allergies: Negative for polydipsia. Does not bruise/bleed easily.  Psychiatric/Behavioral: Negative for depression and suicidal ideas. The patient is not nervous/anxious and does not have insomnia.     Objective  BP 108/62 mmHg  Pulse 82  Temp(Src) 98.9 F (37.2 C) (Oral)  Ht 5' 3.5" (1.613 m)  Wt 120 lb 2 oz (54.488 kg)  BMI 20.94 kg/m2  SpO2 97%  LMP 11/12/2014  Physical Exam  Physical Exam  Constitutional: She is oriented to person, place, and time and well-developed, well-nourished, and in no distress. No distress.  HENT:  Head: Normocephalic and atraumatic.  Right Ear: External ear normal.  Left Ear: External ear normal.  Nose: Nose normal.  Mouth/Throat: Oropharynx is clear and moist. No oropharyngeal exudate.  Eyes: Conjunctivae are normal. Pupils are equal, round, and reactive to light. Right eye exhibits no discharge. Left eye exhibits no discharge. No scleral icterus.  Neck: Normal range of motion. Neck supple. No thyromegaly present.  Cardiovascular: Normal rate, regular rhythm, normal heart sounds and intact distal pulses.   No murmur heard. Pulmonary/Chest: Effort normal and breath sounds normal. No respiratory distress. She has no wheezes. She has no rales.  Abdominal: Soft. Bowel sounds are normal. She exhibits no distension and no mass. There is no tenderness.  Genitourinary: Guaiac negative stool.  Musculoskeletal: Normal range of motion. She exhibits no edema or tenderness.  Lymphadenopathy:    She has no cervical adenopathy.  Neurological: She is alert and oriented to person, place, and time. She has normal reflexes. No cranial nerve deficit. Coordination normal.  Skin: Skin is warm and dry. No rash noted. She is not  diaphoretic.  Psychiatric: Mood, memory and affect normal.    Lab Results  Component Value Date   TSH 1.50 11/25/2014   Lab Results  Component Value Date   WBC 6.9 11/25/2014   HGB 13.1 11/25/2014   HCT 39.4 11/25/2014   MCV 92.6 11/25/2014   PLT 185.0 11/25/2014   Lab Results  Component Value Date   CREATININE 0.53 11/25/2014   BUN 9 11/25/2014   NA 139 11/25/2014   K 4.5 11/25/2014   CL 104 11/25/2014   CO2 30 11/25/2014   Lab Results  Component Value Date   ALT 23 11/25/2014   AST 24 11/25/2014   ALKPHOS 31* 11/25/2014   BILITOT 0.6 11/25/2014   Lab Results  Component Value Date   CHOL 135 11/25/2014   Lab Results  Component Value Date   HDL 49.20 11/25/2014   Lab Results  Component Value Date   LDLCALC 79 11/25/2014   Lab Results  Component Value Date   TRIG 35.0 11/25/2014   Lab Results  Component Value Date   CHOLHDL 3 11/25/2014     Assessment & Plan  DIABETES MELLITUS, GESTATIONAL, HX OF Previous A1C of 5.6  Cervical disc disease 1. Moderate left central and foraminal stenosis due to a large left lateral recess disc protrusion at C6-7, potentially with a small amount of marginal blood products - correlate with chronicity of onset of patient's symptoms. 2. There is also moderate right eccentric central stenosis at the C5-6 level due to a right paracentral disc protrusion. 3. Chronic ethmoid sinusitis. 4. Mild reversal normal cervical lordosis, possibly from muscle Spasm. Struggles with chronic pain. Encouraged moist heat and gentle stretching as tolerated. May try NSAIDs and prescription meds as directed and report if symptoms worsen or seek immediate care  via MRI 2014  Palpitations Check TSH  Preventative health care Patient encouraged to maintain heart healthy diet, regular exercise, adequate sleep. Consider daily probiotics. Take medications as prescribed. Labs ordered and reviewed today  Eye disease  No visual changes,  referred to opthamology  Occasional tremors Pattern suggests benign essentially tremor. Mild for patient

## 2014-12-07 NOTE — Assessment & Plan Note (Signed)
Patient encouraged to maintain heart healthy diet, regular exercise, adequate sleep. Consider daily probiotics. Take medications as prescribed. Labs ordered and reviewed today

## 2014-12-07 NOTE — Assessment & Plan Note (Signed)
No visual changes, referred to opthamology

## 2014-12-07 NOTE — Assessment & Plan Note (Signed)
Pattern suggests benign essentially tremor. Mild for patient

## 2015-01-02 ENCOUNTER — Ambulatory Visit (INDEPENDENT_AMBULATORY_CARE_PROVIDER_SITE_OTHER): Payer: BLUE CROSS/BLUE SHIELD | Admitting: Psychology

## 2015-01-02 DIAGNOSIS — F4322 Adjustment disorder with anxiety: Secondary | ICD-10-CM

## 2015-02-04 ENCOUNTER — Ambulatory Visit: Payer: BLUE CROSS/BLUE SHIELD | Admitting: Psychology

## 2015-02-06 ENCOUNTER — Ambulatory Visit (INDEPENDENT_AMBULATORY_CARE_PROVIDER_SITE_OTHER): Payer: BLUE CROSS/BLUE SHIELD | Admitting: Psychology

## 2015-02-06 DIAGNOSIS — F4322 Adjustment disorder with anxiety: Secondary | ICD-10-CM

## 2015-02-18 LAB — OB RESULTS CONSOLE RUBELLA ANTIBODY, IGM: Rubella: IMMUNE

## 2015-02-18 LAB — OB RESULTS CONSOLE ANTIBODY SCREEN: Antibody Screen: NEGATIVE

## 2015-02-18 LAB — OB RESULTS CONSOLE HIV ANTIBODY (ROUTINE TESTING): HIV: NONREACTIVE

## 2015-02-18 LAB — OB RESULTS CONSOLE HEPATITIS B SURFACE ANTIGEN: Hepatitis B Surface Ag: NEGATIVE

## 2015-02-18 LAB — OB RESULTS CONSOLE RPR: RPR: NONREACTIVE

## 2015-02-18 LAB — OB RESULTS CONSOLE ABO/RH: RH Type: POSITIVE

## 2015-02-18 LAB — OB RESULTS CONSOLE GC/CHLAMYDIA
CHLAMYDIA, DNA PROBE: NEGATIVE
GC PROBE AMP, GENITAL: NEGATIVE

## 2015-02-24 ENCOUNTER — Encounter: Payer: Self-pay | Admitting: Family Medicine

## 2015-02-27 ENCOUNTER — Ambulatory Visit: Payer: BLUE CROSS/BLUE SHIELD | Admitting: Family Medicine

## 2015-03-04 ENCOUNTER — Ambulatory Visit (INDEPENDENT_AMBULATORY_CARE_PROVIDER_SITE_OTHER): Payer: BLUE CROSS/BLUE SHIELD | Admitting: Psychology

## 2015-03-04 DIAGNOSIS — F900 Attention-deficit hyperactivity disorder, predominantly inattentive type: Secondary | ICD-10-CM | POA: Diagnosis not present

## 2015-03-04 DIAGNOSIS — F4322 Adjustment disorder with anxiety: Secondary | ICD-10-CM

## 2015-03-09 ENCOUNTER — Ambulatory Visit: Payer: BLUE CROSS/BLUE SHIELD | Admitting: Psychology

## 2015-09-04 ENCOUNTER — Other Ambulatory Visit: Payer: Self-pay | Admitting: Obstetrics and Gynecology

## 2015-09-04 ENCOUNTER — Encounter (HOSPITAL_COMMUNITY): Payer: Self-pay

## 2015-09-07 ENCOUNTER — Encounter (HOSPITAL_COMMUNITY)
Admission: RE | Admit: 2015-09-07 | Discharge: 2015-09-07 | Disposition: A | Discharge status: Home / Self Care | Payer: BLUE CROSS/BLUE SHIELD | Source: Ambulatory Visit | Attending: Obstetrics and Gynecology | Admitting: Obstetrics and Gynecology

## 2015-09-07 ENCOUNTER — Encounter (HOSPITAL_COMMUNITY): Payer: Self-pay

## 2015-09-07 LAB — ABO/RH: ABO/RH(D): A POS

## 2015-09-07 LAB — CBC
HCT: 31.8 % — ABNORMAL LOW (ref 36.0–46.0)
HEMOGLOBIN: 10.1 g/dL — AB (ref 12.0–15.0)
MCH: 28.3 pg (ref 26.0–34.0)
MCHC: 31.8 g/dL (ref 30.0–36.0)
MCV: 89.1 fL (ref 78.0–100.0)
PLATELETS: 210 10*3/uL (ref 150–400)
RBC: 3.57 MIL/uL — ABNORMAL LOW (ref 3.87–5.11)
RDW: 14.4 % (ref 11.5–15.5)
WBC: 9.2 10*3/uL (ref 4.0–10.5)

## 2015-09-07 LAB — TYPE AND SCREEN
ABO/RH(D): A POS
ANTIBODY SCREEN: NEGATIVE

## 2015-09-07 NOTE — H&P (Addendum)
Sandra Long is a 40 y.o. female presenting for rpt csection for breech.  Maternal Medical History:  Contractions: Onset was less than 1 hour ago.   Frequency: irregular.   Perceived severity is mild.    Fetal activity: Perceived fetal activity is normal.   Last perceived fetal movement was within the past hour.    Prenatal Complications - Diabetes: none.    OB History    Gravida Para Term Preterm AB TAB SAB Ectopic Multiple Living   1              Past Medical History  Diagnosis Date  . Paresthesia 11/25/2014  . Cervical disc disease 11/25/2014    MRI 2014  1. Moderate left central and foraminal stenosis due to a large left lateral recess disc protrusion at C6-7, potentially with a small amount of marginal blood products - correlate with chronicity of onset of patient's symptoms. 2. There is also moderate right eccentric central stenosis at the C5-6 level due to a right paracentral disc protrusion. 3. Chronic ethmoid sinusitis. 4. Mild   . Palpitations 11/25/2014  . Preventative health care 12/07/2014  . Eye disease 12/07/2014    Macular rosacea  . Gestational diabetes 2009   Past Surgical History  Procedure Laterality Date  . Cesarean section  2007  . Wisdom tooth extraction     Family History: family history includes Cancer in her maternal grandfather and paternal grandfather; Diabetes in her father; Gout in her brother and father; Heart disease in her father and mother; Stroke in her father and paternal grandmother; Tremor in her mother. Social History:  reports that she has never smoked. She does not have any smokeless tobacco history on file. She reports that she drinks alcohol. She reports that she does not use illicit drugs.   Prenatal Transfer Tool  Maternal Diabetes: No Genetic Screening: Normal Maternal Ultrasounds/Referrals: Normal Fetal Ultrasounds or other Referrals:  None Maternal Substance Abuse:  No Significant Maternal Medications:  None Significant  Maternal Lab Results:  None Other Comments:  None  Review of Systems  Constitutional: Negative.   All other systems reviewed and are negative.     Last menstrual period 12/13/2014. Maternal Exam:  Uterine Assessment: Contraction strength is mild.  Contraction frequency is rare.   Abdomen: Patient reports no abdominal tenderness. Surgical scars: low transverse.   Fetal presentation: breech  Introitus: Normal vulva. Normal vagina.  Ferning test: not done.  Nitrazine test: not done. Amniotic fluid character: not assessed.  Pelvis: questionable for delivery.   Cervix: Cervix evaluated by digital exam.     Physical Exam  Nursing note and vitals reviewed. Constitutional: She is oriented to person, place, and time. She appears well-developed and well-nourished.  HENT:  Head: Normocephalic and atraumatic.  Neck: Neck supple.  Cardiovascular: Normal rate and regular rhythm.   Respiratory: Effort normal and breath sounds normal.  GI: Soft. Bowel sounds are normal.  Genitourinary: Vagina normal and uterus normal.  Musculoskeletal: Normal range of motion.  Neurological: She is alert and oriented to person, place, and time. She has normal reflexes.  Skin: Skin is warm and dry.  Psychiatric: She has a normal mood and affect.    Prenatal labs: ABO, Rh: --/--/A POS, A POS (03/13 0920) Antibody: NEG (03/13 0920) Rubella: Immune (08/24 0000) RPR: Nonreactive (08/24 0000)  HBsAg: Negative (08/24 0000)  HIV: Non-reactive (08/24 0000)  GBS:     Assessment/Plan: 39 wk iup Breech presentation-confirmed by bedside sono today Rpt csection. Consent  done.    Lovenia Kim 09/07/2015, 10:21 PM

## 2015-09-07 NOTE — Patient Instructions (Signed)
Your procedure is scheduled on:09/08/15  Enter through the Main Entrance at :1pm Pick up desk phone and dial [phone] and inform us of your arrival.  Please call [phone] if you have any problems the morning of surgery.  Remember: Do not eat food after midnight:tonight Clear liquids are ok until:10 am Tuesday   You may brush your teeth the morning of surgery.  Take these meds the morning of surgery with a sip of water:none  DO NOT wear jewelry, eye make-up, lipstick,body lotion, or dark fingernail polish.  (Polished toes are ok) You may wear deodorant.  If you are to be admitted after surgery, leave suitcase in car until your room has been assigned. Patients discharged on the day of surgery will not be allowed to drive home. Wear loose fitting, comfortable clothes for your ride home.

## 2015-09-08 ENCOUNTER — Inpatient Hospital Stay (HOSPITAL_COMMUNITY): Payer: BLUE CROSS/BLUE SHIELD | Admitting: Anesthesiology

## 2015-09-08 ENCOUNTER — Encounter (HOSPITAL_COMMUNITY): Payer: Self-pay | Admitting: *Deleted

## 2015-09-08 ENCOUNTER — Encounter (HOSPITAL_COMMUNITY)
Admission: AD | Disposition: A | Discharge status: Home / Self Care | Payer: Self-pay | Source: Ambulatory Visit | Attending: Obstetrics and Gynecology

## 2015-09-08 ENCOUNTER — Inpatient Hospital Stay (HOSPITAL_COMMUNITY)
Admission: AD | Admit: 2015-09-08 | Discharge: 2015-09-10 | DRG: 766 | Disposition: A | Discharge status: Home / Self Care | Payer: BLUE CROSS/BLUE SHIELD | Source: Ambulatory Visit | Attending: Obstetrics and Gynecology | Admitting: Obstetrics and Gynecology

## 2015-09-08 DIAGNOSIS — O9081 Anemia of the puerperium: Secondary | ICD-10-CM | POA: Diagnosis not present

## 2015-09-08 DIAGNOSIS — Z3A39 39 weeks gestation of pregnancy: Secondary | ICD-10-CM | POA: Diagnosis not present

## 2015-09-08 DIAGNOSIS — O321XX0 Maternal care for breech presentation, not applicable or unspecified: Principal | ICD-10-CM | POA: Diagnosis present

## 2015-09-08 DIAGNOSIS — M5090 Cervical disc disorder, unspecified, unspecified cervical region: Secondary | ICD-10-CM | POA: Diagnosis present

## 2015-09-08 DIAGNOSIS — Z8632 Personal history of gestational diabetes: Secondary | ICD-10-CM

## 2015-09-08 DIAGNOSIS — Z8249 Family history of ischemic heart disease and other diseases of the circulatory system: Secondary | ICD-10-CM

## 2015-09-08 DIAGNOSIS — D649 Anemia, unspecified: Secondary | ICD-10-CM | POA: Diagnosis not present

## 2015-09-08 DIAGNOSIS — O34211 Maternal care for low transverse scar from previous cesarean delivery: Secondary | ICD-10-CM | POA: Diagnosis present

## 2015-09-08 DIAGNOSIS — Z833 Family history of diabetes mellitus: Secondary | ICD-10-CM | POA: Diagnosis not present

## 2015-09-08 DIAGNOSIS — Z823 Family history of stroke: Secondary | ICD-10-CM

## 2015-09-08 LAB — GLUCOSE, CAPILLARY: GLUCOSE-CAPILLARY: 73 mg/dL (ref 65–99)

## 2015-09-08 LAB — RPR: RPR Ser Ql: NONREACTIVE

## 2015-09-08 SURGERY — Surgical Case
Anesthesia: Spinal

## 2015-09-08 MED ORDER — WITCH HAZEL-GLYCERIN EX PADS: 1.0000 "application " | MEDICATED_PAD | CUTANEOUS | Status: DC | PRN

## 2015-09-08 MED ORDER — NALOXONE HCL 2 MG/2ML IJ SOSY
1.0000 ug/kg/h | PREFILLED_SYRINGE | INTRAVENOUS | Status: DC | PRN
  Filled 2015-09-08: qty 2

## 2015-09-08 MED ORDER — METHYLERGONOVINE MALEATE 0.2 MG/ML IJ SOLN: 0.2000 mg | INTRAMUSCULAR | Status: DC | PRN

## 2015-09-08 MED ORDER — MORPHINE SULFATE (PF) 0.5 MG/ML IJ SOLN
INTRAMUSCULAR | Status: DC | PRN
  Administered 2015-09-08: .1 mg via INTRATHECAL

## 2015-09-08 MED ORDER — BUPIVACAINE IN DEXTROSE 0.75-8.25 % IT SOLN
INTRATHECAL | Status: DC | PRN
  Administered 2015-09-08: 9.75 mL via INTRATHECAL

## 2015-09-08 MED ORDER — LACTATED RINGERS IV SOLN
Freq: Once | INTRAVENOUS | Status: AC
  Administered 2015-09-08 (×3): via INTRAVENOUS

## 2015-09-08 MED ORDER — DIPHENHYDRAMINE HCL 50 MG/ML IJ SOLN: 12.5000 mg | INTRAMUSCULAR | Status: DC | PRN

## 2015-09-08 MED ORDER — MORPHINE SULFATE (PF) 0.5 MG/ML IJ SOLN
INTRAMUSCULAR | Status: AC
  Filled 2015-09-08: qty 10

## 2015-09-08 MED ORDER — MEPERIDINE HCL 25 MG/ML IJ SOLN: 6.2500 mg | INTRAMUSCULAR | Status: DC | PRN

## 2015-09-08 MED ORDER — FENTANYL CITRATE (PF) 100 MCG/2ML IJ SOLN
INTRAMUSCULAR | Status: DC | PRN
  Administered 2015-09-08: 12.5 ug via INTRATHECAL

## 2015-09-08 MED ORDER — MENTHOL 3 MG MT LOZG: 1.0000 | LOZENGE | OROMUCOSAL | Status: DC | PRN

## 2015-09-08 MED ORDER — OXYTOCIN 10 UNIT/ML IJ SOLN: 2.5000 [IU]/h | INTRAVENOUS | Status: AC

## 2015-09-08 MED ORDER — FENTANYL CITRATE (PF) 100 MCG/2ML IJ SOLN
INTRAMUSCULAR | Status: AC
Start: 2015-09-08 — End: 2015-09-08
  Filled 2015-09-08: qty 2

## 2015-09-08 MED ORDER — SIMETHICONE 80 MG PO CHEW
80.0000 mg | CHEWABLE_TABLET | ORAL | Status: DC
  Administered 2015-09-08 – 2015-09-09 (×2): 80 mg via ORAL
  Filled 2015-09-08 (×2): qty 1

## 2015-09-08 MED ORDER — LACTATED RINGERS IV SOLN: INTRAVENOUS | Status: DC

## 2015-09-08 MED ORDER — SCOPOLAMINE 1 MG/3DAYS TD PT72: 1.0000 | MEDICATED_PATCH | Freq: Once | TRANSDERMAL | Status: DC

## 2015-09-08 MED ORDER — ONDANSETRON HCL 4 MG/2ML IJ SOLN: 4.0000 mg | Freq: Three times a day (TID) | INTRAMUSCULAR | Status: DC | PRN

## 2015-09-08 MED ORDER — TETANUS-DIPHTH-ACELL PERTUSSIS 5-2.5-18.5 LF-MCG/0.5 IM SUSP: 0.5000 mL | Freq: Once | INTRAMUSCULAR | Status: DC

## 2015-09-08 MED ORDER — ACETAMINOPHEN 325 MG PO TABS: 650.0000 mg | ORAL_TABLET | ORAL | Status: DC | PRN

## 2015-09-08 MED ORDER — SENNOSIDES-DOCUSATE SODIUM 8.6-50 MG PO TABS
2.0000 | ORAL_TABLET | ORAL | Status: DC
  Administered 2015-09-08 – 2015-09-09 (×2): 2 via ORAL
  Filled 2015-09-08 (×2): qty 2

## 2015-09-08 MED ORDER — SCOPOLAMINE 1 MG/3DAYS TD PT72
1.0000 | MEDICATED_PATCH | Freq: Once | TRANSDERMAL | Status: DC
  Administered 2015-09-08: 1.5 mg via TRANSDERMAL

## 2015-09-08 MED ORDER — SCOPOLAMINE 1 MG/3DAYS TD PT72
MEDICATED_PATCH | TRANSDERMAL | Status: AC
  Administered 2015-09-08: 1.5 mg via TRANSDERMAL
  Filled 2015-09-08: qty 1

## 2015-09-08 MED ORDER — DIBUCAINE 1 % RE OINT: 1.0000 "application " | TOPICAL_OINTMENT | RECTAL | Status: DC | PRN

## 2015-09-08 MED ORDER — PHENYLEPHRINE 8 MG IN D5W 100 ML (0.08MG/ML) PREMIX OPTIME
INJECTION | INTRAVENOUS | Status: DC | PRN
  Administered 2015-09-08: 60 ug/min via INTRAVENOUS

## 2015-09-08 MED ORDER — SIMETHICONE 80 MG PO CHEW: 80.0000 mg | CHEWABLE_TABLET | ORAL | Status: DC | PRN

## 2015-09-08 MED ORDER — NALBUPHINE HCL 10 MG/ML IJ SOLN: 5.0000 mg | INTRAMUSCULAR | Status: DC | PRN

## 2015-09-08 MED ORDER — BUPIVACAINE HCL (PF) 0.25 % IJ SOLN
INTRAMUSCULAR | Status: DC | PRN
  Administered 2015-09-08: 20 mL

## 2015-09-08 MED ORDER — KETOROLAC TROMETHAMINE 30 MG/ML IJ SOLN
30.0000 mg | Freq: Four times a day (QID) | INTRAMUSCULAR | Status: AC | PRN
  Administered 2015-09-08: 30 mg via INTRAMUSCULAR

## 2015-09-08 MED ORDER — OXYTOCIN 10 UNIT/ML IJ SOLN
INTRAMUSCULAR | Status: DC | PRN
  Administered 2015-09-08: 40 [IU] via INTRAMUSCULAR

## 2015-09-08 MED ORDER — METHYLERGONOVINE MALEATE 0.2 MG PO TABS: 0.2000 mg | ORAL_TABLET | ORAL | Status: DC | PRN

## 2015-09-08 MED ORDER — IBUPROFEN 600 MG PO TABS: 600.0000 mg | ORAL_TABLET | Freq: Four times a day (QID) | ORAL | Status: DC

## 2015-09-08 MED ORDER — FENTANYL CITRATE (PF) 100 MCG/2ML IJ SOLN: 25.0000 ug | INTRAMUSCULAR | Status: DC | PRN

## 2015-09-08 MED ORDER — SODIUM CHLORIDE 0.9% FLUSH: 3.0000 mL | INTRAVENOUS | Status: DC | PRN

## 2015-09-08 MED ORDER — SIMETHICONE 80 MG PO CHEW
80.0000 mg | CHEWABLE_TABLET | Freq: Three times a day (TID) | ORAL | Status: DC
  Administered 2015-09-09 – 2015-09-10 (×4): 80 mg via ORAL
  Filled 2015-09-08 (×5): qty 1

## 2015-09-08 MED ORDER — OXYTOCIN 10 UNIT/ML IJ SOLN
INTRAMUSCULAR | Status: AC
  Filled 2015-09-08: qty 4

## 2015-09-08 MED ORDER — NALBUPHINE HCL 10 MG/ML IJ SOLN: 5.0000 mg | Freq: Once | INTRAMUSCULAR | Status: DC | PRN

## 2015-09-08 MED ORDER — NALOXONE HCL 0.4 MG/ML IJ SOLN: 0.4000 mg | INTRAMUSCULAR | Status: DC | PRN

## 2015-09-08 MED ORDER — DIPHENHYDRAMINE HCL 25 MG PO CAPS: 25.0000 mg | ORAL_CAPSULE | Freq: Four times a day (QID) | ORAL | Status: DC | PRN

## 2015-09-08 MED ORDER — DIPHENHYDRAMINE HCL 25 MG PO CAPS
25.0000 mg | ORAL_CAPSULE | ORAL | Status: DC | PRN
  Filled 2015-09-08: qty 1

## 2015-09-08 MED ORDER — ONDANSETRON HCL 4 MG/2ML IJ SOLN
INTRAMUSCULAR | Status: DC | PRN
  Administered 2015-09-08: 4 mg via INTRAVENOUS

## 2015-09-08 MED ORDER — KETOROLAC TROMETHAMINE 30 MG/ML IJ SOLN: 30.0000 mg | Freq: Four times a day (QID) | INTRAMUSCULAR | Status: AC | PRN

## 2015-09-08 MED ORDER — PRENATAL MULTIVITAMIN CH
1.0000 | ORAL_TABLET | Freq: Every day | ORAL | Status: DC
  Filled 2015-09-08 (×2): qty 1

## 2015-09-08 MED ORDER — BUPIVACAINE HCL (PF) 0.25 % IJ SOLN
INTRAMUSCULAR | Status: AC
  Filled 2015-09-08: qty 20

## 2015-09-08 MED ORDER — CEFAZOLIN SODIUM-DEXTROSE 2-3 GM-% IV SOLR
2.0000 g | Freq: Once | INTRAVENOUS | Status: AC
  Administered 2015-09-08: 2 g via INTRAVENOUS

## 2015-09-08 MED ORDER — KETOROLAC TROMETHAMINE 30 MG/ML IJ SOLN
INTRAMUSCULAR | Status: AC
Start: 2015-09-08 — End: 2015-09-09
  Filled 2015-09-08: qty 1

## 2015-09-08 MED ORDER — LACTATED RINGERS IV SOLN
INTRAVENOUS | Status: DC | PRN
  Administered 2015-09-08: 15:00:00 via INTRAVENOUS

## 2015-09-08 MED ORDER — CEFAZOLIN SODIUM-DEXTROSE 2-3 GM-% IV SOLR
INTRAVENOUS | Status: AC
  Filled 2015-09-08: qty 50

## 2015-09-08 MED ORDER — IBUPROFEN 600 MG PO TABS
600.0000 mg | ORAL_TABLET | Freq: Four times a day (QID) | ORAL | Status: DC
  Administered 2015-09-08 – 2015-09-10 (×7): 600 mg via ORAL
  Filled 2015-09-08 (×7): qty 1

## 2015-09-08 MED ORDER — ONDANSETRON HCL 4 MG/2ML IJ SOLN
INTRAMUSCULAR | Status: AC
Start: 2015-09-08 — End: 2015-09-08
  Filled 2015-09-08: qty 2

## 2015-09-08 MED ORDER — PHENYLEPHRINE 8 MG IN D5W 100 ML (0.08MG/ML) PREMIX OPTIME
INJECTION | INTRAVENOUS | Status: AC
Start: 2015-09-08 — End: 2015-09-08
  Filled 2015-09-08: qty 100

## 2015-09-08 MED ORDER — LANOLIN HYDROUS EX OINT: 1.0000 "application " | TOPICAL_OINTMENT | CUTANEOUS | Status: DC | PRN

## 2015-09-08 MED ORDER — ZOLPIDEM TARTRATE 5 MG PO TABS: 5.0000 mg | ORAL_TABLET | Freq: Every evening | ORAL | Status: DC | PRN

## 2015-09-08 SURGICAL SUPPLY — 34 items
BENZOIN TINCTURE PRP APPL 2/3 (GAUZE/BANDAGES/DRESSINGS) ×2 IMPLANT
CLAMP CORD UMBIL (MISCELLANEOUS) IMPLANT
CLOSURE STERI STRIP 1/2 X4 (GAUZE/BANDAGES/DRESSINGS) ×2 IMPLANT
CLOTH BEACON ORANGE TIMEOUT ST (SAFETY) ×2 IMPLANT
CONTAINER PREFILL 10% NBF 15ML (MISCELLANEOUS) IMPLANT
DRSG OPSITE POSTOP 4X10 (GAUZE/BANDAGES/DRESSINGS) ×2 IMPLANT
DURAPREP 26ML APPLICATOR (WOUND CARE) ×2 IMPLANT
ELECT REM PT RETURN 9FT ADLT (ELECTROSURGICAL) ×2
ELECTRODE REM PT RTRN 9FT ADLT (ELECTROSURGICAL) ×1 IMPLANT
EXTRACTOR VACUUM M CUP 4 TUBE (SUCTIONS) IMPLANT
GLOVE BIO SURGEON STRL SZ7.5 (GLOVE) ×2 IMPLANT
GLOVE BIOGEL PI IND STRL 7.0 (GLOVE) ×1 IMPLANT
GLOVE BIOGEL PI INDICATOR 7.0 (GLOVE) ×1
GOWN STRL REUS W/TWL LRG LVL3 (GOWN DISPOSABLE) ×4 IMPLANT
KIT ABG SYR 3ML LUER SLIP (SYRINGE) IMPLANT
NEEDLE HYPO 22GX1.5 SAFETY (NEEDLE) ×2 IMPLANT
NEEDLE HYPO 25X5/8 SAFETYGLIDE (NEEDLE) IMPLANT
NEEDLE SPNL 20GX3.5 QUINCKE YW (NEEDLE) IMPLANT
NS IRRIG 1000ML POUR BTL (IV SOLUTION) ×2 IMPLANT
PACK C SECTION WH (CUSTOM PROCEDURE TRAY) ×2 IMPLANT
PENCIL SMOKE EVAC W/HOLSTER (ELECTROSURGICAL) ×2 IMPLANT
SUT MNCRL 0 VIOLET CTX 36 (SUTURE) ×2 IMPLANT
SUT MNCRL AB 3-0 PS2 27 (SUTURE) IMPLANT
SUT MON AB 2-0 CT1 27 (SUTURE) ×2 IMPLANT
SUT MON AB-0 CT1 36 (SUTURE) ×4 IMPLANT
SUT MONOCRYL 0 CTX 36 (SUTURE) ×2
SUT PLAIN 0 NONE (SUTURE) IMPLANT
SUT PLAIN 2 0 (SUTURE)
SUT PLAIN 2 0 XLH (SUTURE) IMPLANT
SUT PLAIN ABS 2-0 CT1 27XMFL (SUTURE) IMPLANT
SYR 20CC LL (SYRINGE) IMPLANT
SYR CONTROL 10ML LL (SYRINGE) ×2 IMPLANT
TOWEL OR 17X24 6PK STRL BLUE (TOWEL DISPOSABLE) ×2 IMPLANT
TRAY FOLEY CATH SILVER 14FR (SET/KITS/TRAYS/PACK) ×2 IMPLANT

## 2015-09-08 NOTE — Transfer of Care (Signed)
Immediate Anesthesia Transfer of Care Note  Patient: Sandra Long  Procedure(s) Performed: Procedure(s) with comments: Repeat CESAREAN SECTION (N/A) - EDD: 09/14/15  Patient Location: PACU  Anesthesia Type:Spinal  Level of Consciousness: awake, alert  and oriented  Airway & Oxygen Therapy: Patient Spontanous Breathing  Post-op Assessment: Report given to RN and Post -op Vital signs reviewed and stable  Post vital signs: Reviewed and stable  Last Vitals:  Filed Vitals:   09/08/15 1314  BP: 93/62  Pulse: 0  Temp: 36.4 C  Resp: 16    Complications: No apparent anesthesia complications

## 2015-09-08 NOTE — Progress Notes (Signed)
Patient ID: Sandra Long, female   DOB: 27-May-1976, 40 y.o.   MRN: [id number] Patient seen and examined. Consent witnessed and signed. No changes noted. Update completed.

## 2015-09-08 NOTE — Anesthesia Preprocedure Evaluation (Signed)
Anesthesia Evaluation  Patient identified by MRN, date of birth, ID band Patient awake    Reviewed: Allergy & Precautions, NPO status , Patient's Chart, lab work & pertinent test results  Airway Mallampati: II  TM Distance: >3 FB Neck ROM: Full    Dental no notable dental hx.    Pulmonary neg pulmonary ROS,    Pulmonary exam normal breath sounds clear to auscultation       Cardiovascular negative cardio ROS Normal cardiovascular exam Rhythm:Regular Rate:Normal     Neuro/Psych negative neurological ROS  negative psych ROS   GI/Hepatic negative GI ROS, Neg liver ROS,   Endo/Other  negative endocrine ROSdiabetes  Renal/GU negative Renal ROS  negative genitourinary   Musculoskeletal negative musculoskeletal ROS (+)   Abdominal   Peds negative pediatric ROS (+)  Hematology negative hematology ROS (+)   Anesthesia Other Findings   Reproductive/Obstetrics negative OB ROS                             Anesthesia Physical Anesthesia Plan  ASA: II  Anesthesia Plan: Spinal   Post-op Pain Management:    Induction:   Airway Management Planned: Natural Airway  Additional Equipment:   Intra-op Plan:   Post-operative Plan:   Informed Consent: I have reviewed the patients History and Physical, chart, labs and discussed the procedure including the risks, benefits and alternatives for the proposed anesthesia with the patient or authorized representative who has indicated his/her understanding and acceptance.   Dental advisory given  Plan Discussed with: CRNA  Anesthesia Plan Comments:         Anesthesia Quick Evaluation

## 2015-09-08 NOTE — Anesthesia Procedure Notes (Signed)
Spinal Patient location during procedure: OR Staffing Anesthesiologist: Basia Mcginty Performed by: anesthesiologist  Preanesthetic Checklist Completed: patient identified, site marked, surgical consent, pre-op evaluation, timeout performed, IV checked, risks and benefits discussed and monitors and equipment checked Spinal Block Patient position: sitting Prep: ChloraPrep Patient monitoring: heart rate, continuous pulse ox and blood pressure Approach: midline Location: L3-4 Injection technique: single-shot Needle Needle type: Sprotte  Needle gauge: 24 G Needle length: 9 cm Additional Notes Expiration date of kit checked and confirmed. Patient tolerated procedure well, without complications.     

## 2015-09-08 NOTE — Op Note (Signed)
Cesarean Section Procedure Note  Indications: malpresentation: frank breech and previous uterine incision kerr x one  Pre-operative Diagnosis: 39 week 1 day pregnancy.  Post-operative Diagnosis: same  Surgeon: Lovenia Kim   Assistants: none  Anesthesia: Local anesthesia 0.25.% bupivacaine and Spinal anesthesia  ASA Class: 2  Procedure Details  The patient was seen in the Holding Room. The risks, benefits, complications, treatment options, and expected outcomes were discussed with the patient.  The patient concurred with the proposed plan, giving informed consent. The risks of anesthesia, infection, bleeding and possible injury to other organs discussed. Injury to bowel, bladder, or ureter with possible need for repair discussed. Possible need for transfusion with secondary risks of hepatitis or HIV acquisition discussed. Post operative complications to include but not limited to DVT, PE and Pneumonia noted. The site of surgery properly noted/marked. The patient was taken to Operating Room # 9, identified as Sandra Long and the procedure verified as C-Section Delivery. A Time Out was held and the above information confirmed.  After induction of anesthesia, the patient was draped and prepped in the usual sterile manner. A Pfannenstiel incision was made and carried down through the subcutaneous tissue to the fascia. Fascial incision was made and extended transversely using Mayo scissors. The fascia was separated from the underlying rectus tissue superiorly and inferiorly. The peritoneum was identified and entered. Peritoneal incision was extended longitudinally. The utero-vesical peritoneal reflection was incised transversely and the bladder flap was bluntly freed from the lower uterine segment. A low transverse uterine incision(Kerr hysterotomy) was made. Delivered from frank breech presentation was a  female with Apgar scores of 8 at one minute and 9 at five minutes. Bulb suctioning gently  performed. Neonatal team in attendance.After the umbilical cord was clamped and cut cord blood was obtained for evaluation. The placenta was removed intact and appeared normal. The uterus was curetted with a dry lap pack. Good hemostasis was noted.The uterine outline, tubes and ovaries appeared normal. The uterine incision was closed with running locked sutures of 0 Monocryl x 2 layers. Hemostasis was observed. Lavage was carried out until clear.The parietal peritoneum was closed with a running 2-0 Monocryl suture. The fascia was then reapproximated with running sutures of 0 Monocryl. The skin was reapproximated with 3-0 monocryl after [place] closure with 2-0 plain.  Instrument, sponge, and needle counts were correct prior the abdominal closure and at the conclusion of the case.   Findings: FTLM, frank breech, nl uterus , nl ovaries  Estimated Blood Loss:  500         Drains: foley                 Specimens: placenta                 Complications:  None; patient tolerated the procedure well.         Disposition: PACU - hemodynamically stable.         Condition: stable  Attending Attestation: I performed the procedure.

## 2015-09-08 NOTE — Anesthesia Postprocedure Evaluation (Signed)
Anesthesia Post Note  Patient: [name]  Procedure(s) Performed: Procedure(s) (LRB): Repeat CESAREAN SECTION (N/A)  Patient location during evaluation: PACU Anesthesia Type: Spinal Level of consciousness: oriented and awake and alert Pain management: pain level controlled Vital Signs Assessment: post-procedure vital signs reviewed and stable Respiratory status: spontaneous breathing, respiratory function stable and patient connected to nasal cannula oxygen Cardiovascular status: blood pressure returned to baseline and stable Postop Assessment: no headache and no backache Anesthetic complications: no    Last Vitals:  Filed Vitals:   09/08/15 1612 09/08/15 1615  BP:  91/63  Pulse: 75 73  Temp:    Resp: 14 17    Last Pain: There were no vitals filed for this visit.               Montez Hageman

## 2015-09-08 NOTE — Consult Note (Signed)
Neonatology Note:   Attendance at C-section:    I was asked by Dr. Ronita Hipps to attend this repeat C/S at term for breech presentation. The mother is a G3P2, GBS not done with good PNC. ROM at delivery, fluid clear. Infant vigorous with good spontaneous cry and tone. Needed only minimal bulb suctioning. Ap 8/9. Lungs clear to ausc in DR. To CN to care of Pediatrician.  Jerlyn Ly, MD

## 2015-09-08 NOTE — Lactation Note (Addendum)
This note was copied from a baby's chart. 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 Infant awake and cueing to feed. Mom latched infant without assistance to right breast in cross cradle hold. Infant with flanged lips, rhythmic sucking, and frequent swallows.   Mom with small firm breasts with compressible areola. Nipples are short shafted and everted and noted to have faint positional stripes to both nipples. Nipple tissue intact. Colostrum easily expressed in large gtts from both breasts. Nipple was noted to be compressed when infant came off breast although positioned well at the breast. Mom has similar issues with another of her children. Discussed using NS if nipple tenderness increases as she had to do with her first child.  Infant noted to have an upper lip frenulum that extended down most of upper gum. He was noted to be biting and pulling tongue back in to mouth with sucking. Enc mom to do suck training prior to BF. Mom did great with deepening latch and uncurling lips as necessary.   Breast shells given to mom to wear between feeds to assist with eversion.  Enc mom to call with questions/concerns prn.   Maternal Data Formula Feeding for Exclusion: No Has patient been taught Hand Expression?: Yes Does the patient have breastfeeding experience prior to this delivery?: Yes  Feeding Feeding Type: Breast Fed Length of feed: 20 min  LATCH Score/Interventions Latch: Grasps breast easily, tongue down, lips flanged, rhythmical sucking.  Audible Swallowing: Spontaneous and intermittent  Type of Nipple: Everted at rest and after stimulation  Comfort (Breast/Nipple): Filling, red/small blisters or bruises, mild/mod discomfort  Problem noted: Cracked, bleeding, blisters, bruises Interventions  (Cracked/bleeding/bruising/blister):  Expressed breast milk to nipple  Hold (Positioning): No assistance needed to correctly position infant at breast.  LATCH Score: 9  Lactation Tools Discussed/Used Tools: Shells Shell Type: Inverted WIC Program: No   Consult Status Consult Status: Follow-up Date: 09/09/15 Follow-up type: In-patient    Debby Freiberg Heidy Mccubbin 09/08/2015, 7:48 PM

## 2015-09-09 ENCOUNTER — Encounter (HOSPITAL_COMMUNITY): Payer: Self-pay | Admitting: *Deleted

## 2015-09-09 LAB — CBC
HCT: 27.2 % — ABNORMAL LOW (ref 36.0–46.0)
Hemoglobin: 9 g/dL — ABNORMAL LOW (ref 12.0–15.0)
MCH: 29 pg (ref 26.0–34.0)
MCHC: 33.1 g/dL (ref 30.0–36.0)
MCV: 87.7 fL (ref 78.0–100.0)
PLATELETS: 198 10*3/uL (ref 150–400)
RBC: 3.1 MIL/uL — AB (ref 3.87–5.11)
RDW: 14.5 % (ref 11.5–15.5)
WBC: 10.1 10*3/uL (ref 4.0–10.5)

## 2015-09-09 LAB — BIRTH TISSUE RECOVERY COLLECTION (PLACENTA DONATION)

## 2015-09-09 MED ORDER — OXYCODONE-ACETAMINOPHEN 5-325 MG PO TABS
1.0000 | ORAL_TABLET | ORAL | Status: DC | PRN
  Administered 2015-09-09 (×4): 1 via ORAL
  Administered 2015-09-10 (×2): 2 via ORAL
  Filled 2015-09-09 (×4): qty 1
  Filled 2015-09-09 (×2): qty 2

## 2015-09-09 MED ORDER — OXYCODONE-ACETAMINOPHEN 5-325 MG PO TABS: 1.0000 | ORAL_TABLET | ORAL | Status: DC | PRN

## 2015-09-09 MED ORDER — OXYCODONE-ACETAMINOPHEN 5-325 MG PO TABS
ORAL_TABLET | ORAL | Status: AC
  Filled 2015-09-09: qty 1

## 2015-09-09 NOTE — Lactation Note (Signed)
This note was copied from a baby's chart. Lactation Consultation Note  Patient Name: Sandra Long [id number] Date: 09/09/2015 Reason for consult: Follow-up assessment;Breast/nipple pain Mom c/o of nipple pain with nursing. Baby noted to have thick, short labial frenulum and short lingual frenulum. At breast baby has some good suckling bursts with swallows but also chewing motions noted off/on. Care for sore nipples reviewed with Mom, no cracking or bleeding at this point but redness noted with slight compression line on right nipple. Mom is alternating positions with feedings, encouraged to wear comfort gels. Mom has good technique with latch, baby appears to have good depth, but the upper lip will tuck intermittently. Advised if nipple pain increases to consider using nipple shield. Mom will advise. Baby cluster fed through the night. Advised Mom is baby cluster feeding is increasing the nipple pain, then hand express after feeding and spoon feed baby some expressed colostrum as "desert" to see if baby will be more satisfied and give Mom's nipple some rest. Ask for assist as needed.   Maternal Data    Feeding Feeding Type: Breast Fed Length of feed: 10 min  LATCH Score/Interventions Latch: Grasps breast easily, tongue down, lips flanged, rhythmical sucking.  Audible Swallowing: A few with stimulation  Type of Nipple: Everted at rest and after stimulation  Comfort (Breast/Nipple): Filling, red/small blisters or bruises, mild/mod discomfort  Problem noted: Mild/Moderate discomfort Interventions  (Cracked/bleeding/bruising/blister): Expressed breast milk to nipple Interventions (Mild/moderate discomfort): Comfort gels  Hold (Positioning): No assistance needed to correctly position infant at breast.  LATCH Score: 8  Lactation Tools Discussed/Used Tools: Shells;Comfort gels Shell Type: Inverted   Consult Status Consult Status: Follow-up Date: 09/10/15 Follow-up type:  In-patient    Katrine Coho 09/09/2015, 4:25 PM

## 2015-09-09 NOTE — Lactation Note (Signed)
This note was copied from a baby's chart. Lactation Consultation Note Follow up visit at 30 hours of age.  Mom reports having nipple pain and concerned about transfer of milk.  Baby is showing feeding cues and placed football hold on left breast.  Nipples are red and bruised.  Baby has audible swallow, but continues to complain of pain with position adjustments.  LC allowed baby to suck gloved finger.  Baby flanges upper lip well, but appears to have a suck blister.  Baby is tongue thrusting and chomping.  Suck training to get more rhythmic sucking demonstrated and encouraged.  LC assisted with laid back position on right breast.  Baby latched well with rhythmic sucking for about 10 minutes mom reported pain of about "2-3".  Mom then independently latched on left breast and reported pain of "5".  Lc assisted with deeper latch and mom reported improved comfort.  Mom will continue to work on laid back positioning, use comfort gels.  Discussed use of NS if latch continues to hurt and would then advise to pump also.  Mom reports discomfort with hand expression, but a few drops were given via spoon to baby.  Mom to call for assist as needed.      Patient Name: Sandra Long [id number] Date: 09/09/2015 Reason for consult: Follow-up assessment;Breast/nipple pain   Maternal Data    Feeding Feeding Type: Breast Fed Length of feed: 10 min (still feeding)  LATCH Score/Interventions Latch: Grasps breast easily, tongue down, lips flanged, rhythmical sucking.  Audible Swallowing: A few with stimulation Intervention(s): Skin to skin;Hand expression;Alternate breast massage  Type of Nipple: Everted at rest and after stimulation  Comfort (Breast/Nipple): Filling, red/small blisters or bruises, mild/mod discomfort  Interventions (Mild/moderate discomfort): Comfort gels  Hold (Positioning): No assistance needed to correctly position infant at breast.  LATCH Score: 8  Lactation Tools Discussed/Used Tools:  Comfort gels   Consult Status Consult Status: Follow-up Date: 09/10/15 Follow-up type: In-patient    Justice Britain 09/09/2015, 9:15 PM

## 2015-09-09 NOTE — Progress Notes (Signed)
Post Partum Day 1 Subjective: no complaints, up ad lib, voiding, tolerating PO and + flatus  Objective: Blood pressure 84/50, pulse 57, temperature 97.5 F (36.4 C), temperature source Oral, resp. rate 16, last menstrual period 12/13/2014, SpO2 100 %, unknown if currently breastfeeding.  Physical Exam:  General: alert, cooperative and appears stated age Lochia: appropriate Uterine Fundus: firm Incision: healing well, no significant drainage, no dehiscence, no significant erythema DVT Evaluation: No evidence of DVT seen on physical exam. Negative Homan's sign. No cords or calf tenderness.   Recent Labs  09/07/15 0920 09/09/15 0538  HGB 10.1* 9.0*  HCT 31.8* 27.2*    Assessment/Plan: Normal PP exam PP anemia asymptomatic Plan for discharge tomorrow, Breastfeeding and Circumcision prior to discharge   LOS: 1 day   Ryland Tungate J 09/09/2015, 10:50 AM

## 2015-09-09 NOTE — Progress Notes (Signed)
t bailey notified of low b/p   Asymptomatic      Up in room   [facility] to give percocet

## 2015-09-09 NOTE — Progress Notes (Signed)
Asymptomatic, up and walking

## 2015-09-10 MED ORDER — IBUPROFEN 600 MG PO TABS: 600.0000 mg | ORAL_TABLET | Freq: Four times a day (QID) | ORAL | Status: DC

## 2015-09-10 MED ORDER — OXYCODONE-ACETAMINOPHEN 5-325 MG PO TABS: 1.0000 | ORAL_TABLET | ORAL | Status: DC | PRN

## 2015-09-10 NOTE — Progress Notes (Signed)
POSTOPERATIVE DAY # 2 S/P CS-repeat / breech  S:         Reports feeling well             Tolerating po intake / no nausea / no vomiting / + flatus / no BM             Bleeding is spotting             Pain controlled with motrin and percocet             Up ad lib / ambulatory/ voiding QS  Newborn breast feeding  / Circumcision  O:  VS: BP 96/50 mmHg  Pulse 62  Temp(Src) 98.5 F (36.9 C) (Oral)  Resp 18  SpO2 100%  LMP 12/13/2014 (Approximate)  Breastfeeding? Unknown   LABS:               Recent Labs  09/09/15 0538  WBC 10.1  HGB 9.0*  PLT 198               Bloodtype: --/--/A POS, A POS (03/13 0920)  Rubella: Immune (08/24 0000)                                                Physical Exam:             Alert and Oriented X3  Abdomen: soft, non-tender, non-distended              Fundus: firm, non-tender, U-1             Dressing intact honeycomb              Incision:  approximated with suture / no erythema / no ecchymosis / no drainage  Perineum: intact  Lochia: light  Extremities: trace pedal and ankle edema, no calf pain or tenderness  A:        POD # 2 S/P repeat CS             P:        Routine postoperative care               DC home   Artelia Laroche CNM, MSN, [facility] 09/10/2015, 9:29 AM

## 2015-09-10 NOTE — Discharge Summary (Signed)
OB Discharge Summary  Patient Name: Sandra Long DOB: [birth year] MRN: [id number]  Date of admission: 09/08/2015  Admitting diagnosis: CTX Breech Presentation, Previous Cesarean Section Intrauterine pregnancy: [redacted]w[redacted]d     Secondary diagnosis: breech presentation   Date of discharge: 09/10/2015     Discharge diagnosis: Term Pregnancy Delivered      Prenatal history: [id number]   EDC : 09/14/2015, Alternate EDD Entry  Prenatal care at [facility]  Primary provider : taavon Prenatal course complicated by previous CS and VBAC / breech presentation  Prenatal Labs: ABO, Rh: --/--/A POS, A POS (03/13 0920)  Antibody: NEG (03/13 0920) Rubella: Immune (08/24 0000)   RPR: Non Reactive (03/13 0920)  HBsAg: Negative (08/24 0000)  HIV: Non-reactive (08/24 0000)  GBS:                                      Hospital course:  Sceduled C/S   40 y.o. yo G4P2011 at [redacted]w[redacted]d was admitted to the hospital 09/08/2015 for scheduled cesarean section with the following indication:Malpresentation.  Membrane Rupture Time/Date: 4:36 PM ,09/08/2015   Patient delivered a Viable infant.09/08/2015  Details of operation can be found in separate operative note.  Pateint had an uncomplicated postpartum course.  She is ambulating, tolerating a regular diet, passing flatus, and urinating well. Patient is discharged home in stable condition on  09/10/2015         Delivering PROVIDER: Brien Few                                                            Complications: None  Newborn Data: Live born female  Birth Weight: 7 lb 11.3 oz (3495 g) APGAR: 8, 9  Baby Feeding: Breast Disposition:home with mother  Post partum procedures:none  Postpartum contraception: Not Discussed    Labs: Lab Results  Component Value Date   WBC 10.1 09/09/2015   HGB 9.0* 09/09/2015   HCT 27.2* 09/09/2015   MCV 87.7 09/09/2015   PLT 198 09/09/2015   CMP Latest Ref Rng 11/25/2014  Glucose 70 - 99 mg/dL  87  BUN 6 - 23 mg/dL 9  Creatinine 0.40 - 1.20 mg/dL 0.53  Sodium 135 - 145 mEq/L 139  Potassium 3.5 - 5.1 mEq/L 4.5  Chloride 96 - 112 mEq/L 104  CO2 19 - 32 mEq/L 30  Calcium 8.4 - 10.5 mg/dL 9.4  Total Protein 6.0 - 8.3 g/dL 6.6  Total Bilirubin 0.2 - 1.2 mg/dL 0.6  Alkaline Phos 39 - 117 U/L 31(L)  AST 0 - 37 U/L 24  ALT 0 - 35 U/L 23    Physical Exam @ time of discharge:  Filed Vitals:   09/09/15 0900 09/09/15 1216 09/09/15 1820 09/10/15 0545  BP: 84/50 84/56 86/51  96/50  Pulse: 57 53 64 62  Temp: 97.5 F (36.4 C) 98 F (36.7 C) 98.1 F (36.7 C) 98.5 F (36.9 C)  TempSrc: Oral Oral Oral Oral  Resp: 16 16 16 18   SpO2:   100%     General: alert, cooperative and no distress Lochia: appropriate Uterine Fundus: firm Perineum: intact Incision: Healing well with no significant drainage Extremities: mild dependent edema DVT Evaluation:  No evidence of DVT seen on physical exam.   Discharge instructions:  "Baby and Me Booklet" and Evangeline  Discharge Medications:    Medication List    STOP taking these medications        ALPRAZolam 0.25 MG tablet  Commonly known as:  XANAX      TAKE these medications        ibuprofen 600 MG tablet  Commonly known as:  ADVIL,MOTRIN  Take 1 tablet (600 mg total) by mouth every 6 (six) hours.     Magnesium 200 MG Tabs  Take 400 mg by mouth daily.     MULTIVITAMIN ADULT PO  Take 1 tablet by mouth daily.     oxyCODONE-acetaminophen 5-325 MG tablet  Commonly known as:  PERCOCET/ROXICET  Take 1-2 tablets by mouth every 4 (four) hours as needed for severe pain.     Vitamin D (Cholecalciferol) 1000 units Tabs  Take 2,000 Units by mouth daily.        Diet: routine diet  Activity: Advance as tolerated. Pelvic rest x 6 weeks.   Follow up:6 weeks    Signed: Artelia Laroche CNM, MSN, [facility] 09/10/2015, 10:49 AM

## 2015-10-19 DIAGNOSIS — Z1151 Encounter for screening for human papillomavirus (HPV): Secondary | ICD-10-CM | POA: Diagnosis not present

## 2015-12-03 ENCOUNTER — Ambulatory Visit (INDEPENDENT_AMBULATORY_CARE_PROVIDER_SITE_OTHER): Payer: BLUE CROSS/BLUE SHIELD | Admitting: Medical

## 2015-12-03 VITALS — BP 110/70 | HR 82 | Wt 122.0 lb

## 2015-12-03 DIAGNOSIS — L089 Local infection of the skin and subcutaneous tissue, unspecified: Secondary | ICD-10-CM | POA: Diagnosis not present

## 2015-12-03 MED ORDER — SULFAMETHOXAZOLE-TRIMETHOPRIM 800-160 MG PO TABS: 1.0000 | ORAL_TABLET | Freq: Two times a day (BID) | ORAL | Status: DC

## 2015-12-03 MED ORDER — MUPIROCIN 2 % EX OINT: TOPICAL_OINTMENT | CUTANEOUS | Status: DC

## 2015-12-03 NOTE — Progress Notes (Signed)
Pre visit review using our clinic review tool, if applicable. No additional management support is needed unless otherwise documented below in the visit note. 

## 2015-12-03 NOTE — Patient Instructions (Signed)
For your skin infection I did rx bactrim DS. Checked with our pharmacist and got opinion on breast feeding. He stated ok.  Can continue mupirocin.  Try to squeeze or irritate area further.  Follow up in 7 days or as needed

## 2015-12-03 NOTE — Progress Notes (Signed)
Subjective:    Patient ID: Sandra Long, female    DOB: [birth year], 40 y.o.   MRN: [id number]  HPI  Pt in with report of slight pimple to her left upper cheekeye area on friday. She applied warm compresses to area.   Pt stuck needle in area and she reports since then mild redness and faint tender. She stuck needle in area on Sunday. Below the needle area has some tenderness.  Pt has had mrsa one time on rt side of her nose.(in past not recently)    Review of Systems  Constitutional: Negative for fever, chills and fatigue.  Respiratory: Negative for cough, choking and shortness of breath.   Cardiovascular: Negative for chest pain and palpitations.  Skin: Negative for rash.       Lt lower eye lid and cheek faint redness and puffiness  Psychiatric/Behavioral: Negative for behavioral problems and confusion.    Past Medical History  Diagnosis Date  . Paresthesia 11/25/2014  . Cervical disc disease 11/25/2014    MRI 2014  1. Moderate left central and foraminal stenosis due to a large left lateral recess disc protrusion at C6-7, potentially with a small amount of marginal blood products - correlate with chronicity of onset of patient's symptoms. 2. There is also moderate right eccentric central stenosis at the C5-6 level due to a right paracentral disc protrusion. 3. Chronic ethmoid sinusitis. 4. Mild   . Palpitations 11/25/2014  . Preventative health care 12/07/2014  . Eye disease 12/07/2014    Macular rosacea  . Gestational diabetes 2009     Social History   Social History  . Marital Status: Married    Spouse Name: N/A  . Number of Children: N/A  . Years of Education: N/A   Occupational History  . RN [employer]    Social History Main Topics  . Smoking status: Never Smoker   . Smokeless tobacco: Not on file  . Alcohol Use: 0.0 oz/week    0 Standard drinks or equivalent per week  . Drug Use: No  . Sexual Activity: Not on file   Comment: lives with husband, no red meat, works as RN at [employer]   Other Topics Concern  . Not on file   Social History Narrative    Past Surgical History  Procedure Laterality Date  . Cesarean section  2007  . Wisdom tooth extraction    . Cesarean section N/A 09/08/2015    Procedure: Repeat CESAREAN SECTION;  Surgeon: Richard Taavon, MD;  Location: [facility];  Service: Obstetrics;  Laterality: N/A;  EDD: 09/14/15    Family History  Problem Relation Age of Onset  . Heart disease Mother     2 stents 2015  . Tremor Mother     benign essential  . Heart disease Father     stents  . Diabetes Father     20 15  . Stroke Father   . Gout Father   . Gout Brother   . Cancer Maternal Grandfather     Multiple Myeloma  . Stroke Paternal Grandmother   . Cancer Paternal Grandfather     bladder cancer    No Known Allergies  Current Outpatient Prescriptions on File Prior to Visit  Medication Sig Dispense Refill  . ibuprofen (ADVIL,MOTRIN) 600 MG tablet Take 1 tablet (600 mg total) by mouth every 6 (six) hours. 30 tablet 0  . Magnesium 200 MG TABS Take 400 mg by mouth daily.    . Multiple  Vitamins-Minerals (MULTIVITAMIN ADULT PO) Take 1 tablet by mouth daily.    Marland Kitchen oxyCODONE-acetaminophen (PERCOCET/ROXICET) 5-325 MG tablet Take 1-2 tablets by mouth every 4 (four) hours as needed for severe pain. 30 tablet 0  . Vitamin D, Cholecalciferol, 1000 UNITS TABS Take 2,000 Units by mouth daily.     No current facility-administered medications on file prior to visit.    BP 110/70 mmHg  Pulse 82  Wt 122 lb (55.339 kg)  SpO2 98%       Objective:   Physical Exam   General- No acute distress. Pleasant patient.  Skin- just below lt eye lash small area where she tried to lance pimple. Faint redness and puffiness just below down to cheek. Faint tender to touch per patient not warm.      Assessment & Plan:  For your skin infection I did rx bactrim DS. Checked with our pharmacist and got  opinion on breast feeding. He stated ok.  Can continue mupirocin.  Try to squeeze or irritate area further.  Follow up in 7 days or as needed

## 2015-12-10 ENCOUNTER — Telehealth: Payer: Self-pay | Admitting: Family Medicine

## 2015-12-10 MED ORDER — SULFAMETHOXAZOLE-TRIMETHOPRIM 800-160 MG PO TABS: 1.0000 | ORAL_TABLET | Freq: Two times a day (BID) | ORAL | Status: DC

## 2015-12-10 NOTE — Telephone Encounter (Signed)
Please advise on note below. Pt was last seen on 12/03/15 for a skin infection.  Pt wants to see if she can have an extension for three more days since she has noticed a little redness on her face and she wants to make sure its all cleared up.

## 2015-12-10 NOTE — Telephone Encounter (Signed)
Pt says that she is out of the antibiotic that PA prescribed. She would like to have another dosage BACTRIM.    Pharmacy: [id number] - [place], [place]   CB: [phone]

## 2015-12-10 NOTE — Telephone Encounter (Signed)
Let her know sent in 3 more days of antibiotics. If any questions or concerns after that recommend come in have area checked.

## 2015-12-10 NOTE — Telephone Encounter (Signed)
Spoke with pt and she voices understanding.  

## 2015-12-31 DIAGNOSIS — R109 Unspecified abdominal pain: Secondary | ICD-10-CM | POA: Diagnosis not present

## 2016-01-14 DIAGNOSIS — L814 Other melanin hyperpigmentation: Secondary | ICD-10-CM | POA: Diagnosis not present

## 2016-01-14 DIAGNOSIS — D1801 Hemangioma of skin and subcutaneous tissue: Secondary | ICD-10-CM | POA: Diagnosis not present

## 2016-01-14 DIAGNOSIS — D171 Benign lipomatous neoplasm of skin and subcutaneous tissue of trunk: Secondary | ICD-10-CM | POA: Diagnosis not present

## 2016-01-14 DIAGNOSIS — D225 Melanocytic nevi of trunk: Secondary | ICD-10-CM | POA: Diagnosis not present

## 2016-01-20 DIAGNOSIS — R229 Localized swelling, mass and lump, unspecified: Secondary | ICD-10-CM | POA: Diagnosis not present

## 2016-06-07 ENCOUNTER — Ambulatory Visit (INDEPENDENT_AMBULATORY_CARE_PROVIDER_SITE_OTHER): Payer: BLUE CROSS/BLUE SHIELD | Admitting: Family Medicine

## 2016-06-07 ENCOUNTER — Encounter: Payer: Self-pay | Admitting: Family Medicine

## 2016-06-07 DIAGNOSIS — E785 Hyperlipidemia, unspecified: Secondary | ICD-10-CM | POA: Insufficient documentation

## 2016-06-07 DIAGNOSIS — R739 Hyperglycemia, unspecified: Secondary | ICD-10-CM

## 2016-06-07 DIAGNOSIS — Z0000 Encounter for general adult medical examination without abnormal findings: Secondary | ICD-10-CM | POA: Diagnosis not present

## 2016-06-07 DIAGNOSIS — D649 Anemia, unspecified: Secondary | ICD-10-CM

## 2016-06-07 HISTORY — DX: Hyperlipidemia, unspecified: E78.5

## 2016-06-07 HISTORY — DX: Hyperglycemia, unspecified: R73.9

## 2016-06-07 HISTORY — DX: Anemia, unspecified: D64.9

## 2016-06-07 LAB — CBC
HEMATOCRIT: 38.8 % (ref 36.0–46.0)
Hemoglobin: 13.3 g/dL (ref 12.0–15.0)
MCHC: 34.2 g/dL (ref 30.0–36.0)
MCV: 91 fl (ref 78.0–100.0)
Platelets: 224 10*3/uL (ref 150.0–400.0)
RBC: 4.27 Mil/uL (ref 3.87–5.11)
RDW: 14.3 % (ref 11.5–15.5)
WBC: 5.1 10*3/uL (ref 4.0–10.5)

## 2016-06-07 LAB — COMPREHENSIVE METABOLIC PANEL
ALBUMIN: 4.3 g/dL (ref 3.5–5.2)
ALT: 28 U/L (ref 0–35)
AST: 29 U/L (ref 0–37)
Alkaline Phosphatase: 61 U/L (ref 39–117)
BUN: 13 mg/dL (ref 6–23)
CHLORIDE: 105 meq/L (ref 96–112)
CO2: 26 meq/L (ref 19–32)
CREATININE: 0.57 mg/dL (ref 0.40–1.20)
Calcium: 9.1 mg/dL (ref 8.4–10.5)
GFR: 124.89 mL/min (ref >60.00)
GLUCOSE: 88 mg/dL (ref 70–99)
POTASSIUM: 3.8 meq/L (ref 3.5–5.1)
SODIUM: 140 meq/L (ref 135–145)
Total Bilirubin: 0.7 mg/dL (ref 0.2–1.2)
Total Protein: 6.7 g/dL (ref 6.0–8.3)

## 2016-06-07 LAB — LIPID PANEL
CHOL/HDL RATIO: 3
CHOLESTEROL: 132 mg/dL (ref 0–200)
HDL: 49.6 mg/dL (ref >39.00)
LDL CALC: 76 mg/dL (ref 0–99)
NONHDL: 82.72
Triglycerides: 35 mg/dL (ref 0.0–149.0)
VLDL: 7 mg/dL (ref 0.0–40.0)

## 2016-06-07 LAB — TSH: TSH: 2.21 u[IU]/mL (ref 0.35–4.50)

## 2016-06-07 LAB — HEMOGLOBIN A1C: HEMOGLOBIN A1C: 5.5 % (ref 4.6–6.5)

## 2016-06-07 NOTE — Assessment & Plan Note (Signed)
Mild but patient endorsing poor eating habits since delivery of 3 rd child and less exercise so will check FLP today

## 2016-06-07 NOTE — Progress Notes (Signed)
Pre visit review using our clinic review tool, if applicable. No additional management support is needed unless otherwise documented below in the visit note. 

## 2016-06-07 NOTE — Assessment & Plan Note (Addendum)
minimize simple carbs. Increase exercise as tolerated. Has a h/o GDM

## 2016-06-07 NOTE — Assessment & Plan Note (Addendum)
Patient encouraged to maintain heart healthy diet, regular exercise, adequate sleep. Consider daily probiotics. Take medications as prescribed, patient questions if she has a mild case of SAD but is unable to take an SSRI due to nursing at this time so will consider next year if necessary

## 2016-06-07 NOTE — Patient Instructions (Addendum)
, Krill oil capsule 1 tab daily , Vitamin D (2,000); NOW Probiotics.  Preventive Care 18-39 Years, Female Preventive care refers to lifestyle choices and visits with your health care provider that can promote health and wellness. What does preventive care include?  A yearly physical exam. This is also called an annual well check.  Dental exams once or twice a year.  Routine eye exams. Ask your health care provider how often you should have your eyes checked.  Personal lifestyle choices, including:  Daily care of your teeth and gums.  Regular physical activity.  Eating a healthy diet.  Avoiding tobacco and drug use.  Limiting alcohol use.  Practicing safe sex.  Taking vitamin and mineral supplements as recommended by your health care provider. What happens during an annual well check? The services and screenings done by your health care provider during your annual well check will depend on your age, overall health, lifestyle risk factors, and family history of disease. Counseling  Your health care provider may ask you questions about your:  Alcohol use.  Tobacco use.  Drug use.  Emotional well-being.  Home and relationship well-being.  Sexual activity.  Eating habits.  Work and work Statistician.  Method of birth control.  Menstrual cycle.  Pregnancy history. Screening  You may have the following tests or measurements:  Height, weight, and BMI.  Diabetes screening. This is done by checking your blood sugar (glucose) after you have not eaten for a while (fasting).  Blood pressure.  Lipid and cholesterol levels. These may be checked every 5 years starting at age 52.  Skin check.  Hepatitis C blood test.  Hepatitis B blood test.  Sexually transmitted disease (STD) testing.  BRCA-related cancer screening. This may be done if you have a family history of breast, ovarian, tubal, or peritoneal cancers.  Pelvic exam and Pap test. This may be done every  3 years starting at age 88. Starting at age 76, this may be done every 5 years if you have a Pap test in combination with an HPV test. Discuss your test results, treatment options, and if necessary, the need for more tests with your health care provider. Vaccines  Your health care provider may recommend certain vaccines, such as:  Influenza vaccine. This is recommended every year.  Tetanus, diphtheria, and acellular pertussis (Tdap, Td) vaccine. You may need a Td booster every 10 years.  Varicella vaccine. You may need this if you have not been vaccinated.  HPV vaccine. If you are 32 or younger, you may need three doses over 6 months.  Measles, mumps, and rubella (MMR) vaccine. You may need at least one dose of MMR. You may also need a second dose.  Pneumococcal 13-valent conjugate (PCV13) vaccine. You may need this if you have certain conditions and were not previously vaccinated.  Pneumococcal polysaccharide (PPSV23) vaccine. You may need one or two doses if you smoke cigarettes or if you have certain conditions.  Meningococcal vaccine. One dose is recommended if you are age 62-21 years and a first-year college student living in a residence hall, or if you have one of several medical conditions. You may also need additional booster doses.  Hepatitis A vaccine. You may need this if you have certain conditions or if you travel or work in places where you may be exposed to hepatitis A.  Hepatitis B vaccine. You may need this if you have certain conditions or if you travel or work in places where you may be exposed  to hepatitis B.  Haemophilus influenzae type b (Hib) vaccine. You may need this if you have certain risk factors. Talk to your health care provider about which screenings and vaccines you need and how often you need them. This information is not intended to replace advice given to you by your health care provider. Make sure you discuss any questions you have with your health care  provider. Document Released: 08/09/2001 Document Revised: 03/02/2016 Document Reviewed: 04/14/2015 Elsevier Interactive Patient Education  2017 [employer].

## 2016-06-07 NOTE — Progress Notes (Signed)
Patient ID: Sandra Long, female   DOB: Sep 21, 1975, 40 y.o.   MRN: [id number]   Subjective:    Patient ID: Sandra Long, female    DOB: Oct 03, 1975, 40 y.o.   MRN: [id number]  No chief complaint on file.   HPI Patient is in today for annual exam patient c/o left side abdominal lesion on the wall, has been looked . She feels well today and is accompanied by her 78 month old infant who is doing well. He was delivered by C section due to breech presentation but she did not have any complications. Only thing she has noted since is the small knot on her abdominal wall to left of her bell button. nontender not changing. She did have ADD work up prior to pregnancy and it was confirmed she had diagnosis and would benefit from meds but she is still nursing so declines for now. She endorses fatigue due to son not sleeping. Denies CP/palp/SOB/HA/congestion/fevers/GI or GU c/o. Taking meds as prescribed  Past Medical History:  Diagnosis Date  . Anemia 06/07/2016  . Cervical disc disease 11/25/2014   MRI 2014  1. Moderate left central and foraminal stenosis due to a large left lateral recess disc protrusion at C6-7, potentially with a small amount of marginal blood products - correlate with chronicity of onset of patient's symptoms. 2. There is also moderate right eccentric central stenosis at the C5-6 level due to a right paracentral disc protrusion. 3. Chronic ethmoid sinusitis. 4. Mild   . Dyslipidemia 06/07/2016  . Eye disease 12/07/2014   Macular rosacea  . Gestational diabetes 2009  . Hyperglycemia 06/07/2016  . Palpitations 11/25/2014  . Paresthesia 11/25/2014  . Preventative health care 12/07/2014    Past Surgical History:  Procedure Laterality Date  . CESAREAN SECTION  2007  . CESAREAN SECTION N/A 09/08/2015   Procedure: Repeat CESAREAN SECTION;  Surgeon: Brien Few, MD;  Location: [facility];  Service: Obstetrics;  Laterality: N/A;  EDD: 09/14/15  . WISDOM TOOTH EXTRACTION      Family  History  Problem Relation Age of Onset  . Heart disease Mother     2 stents 2015  . Tremor Mother     benign essential  . Heart disease Father     stents  . Diabetes Father     57  . Stroke Father   . Gout Father   . Blindness Father   . Gout Brother   . COPD Son   . Cancer Maternal Grandfather     Multiple Myeloma  . Stroke Paternal Grandmother   . Cancer Paternal Grandfather     bladder cancer    Social History   Social History  . Marital status: Married    Spouse name: N/A  . Number of children: N/A  . Years of education: N/A   Occupational History  . RN [employer]    Social History Main Topics  . Smoking status: Never Smoker  . Smokeless tobacco: Never Used  . Alcohol use 0.0 oz/week  . Drug use: No  . Sexual activity: Not on file     Comment: lives with husband, no red meat, works as Therapist, sports at [employer]   Other Topics Concern  . Not on file   Social History Narrative  . No narrative on file    Outpatient Medications Prior to Visit  Medication Sig Dispense Refill  . ibuprofen (ADVIL,MOTRIN) 600 MG tablet Take 1 tablet (600 mg total) by mouth every  6 (six) hours. 30 tablet 0  . Magnesium 200 MG TABS Take 400 mg by mouth daily.    . Multiple Vitamins-Minerals (MULTIVITAMIN ADULT PO) Take 1 tablet by mouth daily.    . Vitamin D, Cholecalciferol, 1000 UNITS TABS Take 2,000 Units by mouth daily.    . mupirocin ointment (BACTROBAN) 2 % Apply to area bid (Patient not taking: Reported on 06/07/2016) 22 g 0  . oxyCODONE-acetaminophen (PERCOCET/ROXICET) 5-325 MG tablet Take 1-2 tablets by mouth every 4 (four) hours as needed for severe pain. (Patient not taking: Reported on 06/07/2016) 30 tablet 0  . sulfamethoxazole-trimethoprim (BACTRIM DS,SEPTRA DS) 800-160 MG tablet Take 1 tablet by mouth 2 (two) times daily. (Patient not taking: Reported on 06/07/2016) 6 tablet 0   No facility-administered medications prior to visit.     No  Known Allergies  Review of Systems  Constitutional: Positive for malaise/fatigue. Negative for fever.  Eyes: Negative for blurred vision.  Respiratory: Negative for cough and shortness of breath.   Cardiovascular: Negative for chest pain and palpitations.  Gastrointestinal: Negative for vomiting.  Musculoskeletal: Negative for back pain.  Skin: Negative for rash.  Neurological: Negative for loss of consciousness and headaches.  Psychiatric/Behavioral: Positive for depression. The patient is not nervous/anxious.        Objective:    Physical Exam  Constitutional: She is oriented to person, place, and time. She appears well-developed and well-nourished. No distress.  HENT:  Head: Normocephalic and atraumatic.  Eyes: Conjunctivae are normal.  Neck: Normal range of motion. No thyromegaly present.  Cardiovascular: Normal rate and regular rhythm.   Pulmonary/Chest: Effort normal and breath sounds normal. She has no wheezes.  Abdominal: Soft. Bowel sounds are normal. There is no tenderness.  Calcified LN on abdominal wall, 5 mm x 10 mm mobile, nontender, not erythematous  Musculoskeletal: Normal range of motion. She exhibits no edema or deformity.  Neurological: She is alert and oriented to person, place, and time.  Skin: Skin is warm and dry. She is not diaphoretic.  Psychiatric: She has a normal mood and affect.    BP 100/68 (BP Location: Left Arm, Patient Position: Sitting, Cuff Size: Normal)   Pulse 71   Temp 97.4 F (36.3 C) (Oral)   Wt 121 lb 9.6 oz (55.2 kg)   SpO2 97% Comment: RA  Breastfeeding? Yes   BMI 21.54 kg/m  Wt Readings from Last 3 Encounters:  06/07/16 121 lb 9.6 oz (55.2 kg)  12/03/15 122 lb (55.3 kg)  09/07/15 142 lb (64.4 kg)     Lab Results  Component Value Date   WBC 10.1 09/09/2015   HGB 9.0 (L) 09/09/2015   HCT 27.2 (L) 09/09/2015   PLT 198 09/09/2015   GLUCOSE 87 11/25/2014   CHOL 135 11/25/2014   TRIG 35.0 11/25/2014   HDL 49.20 11/25/2014    LDLCALC 79 11/25/2014   ALT 23 11/25/2014   AST 24 11/25/2014   NA 139 11/25/2014   K 4.5 11/25/2014   CL 104 11/25/2014   CREATININE 0.53 11/25/2014   BUN 9 11/25/2014   CO2 30 11/25/2014   TSH 1.50 11/25/2014   HGBA1C 5.2 11/25/2014    Lab Results  Component Value Date   TSH 1.50 11/25/2014   Lab Results  Component Value Date   WBC 10.1 09/09/2015   HGB 9.0 (L) 09/09/2015   HCT 27.2 (L) 09/09/2015   MCV 87.7 09/09/2015   PLT 198 09/09/2015   Lab Results  Component Value Date  NA 139 11/25/2014   K 4.5 11/25/2014   CO2 30 11/25/2014   GLUCOSE 87 11/25/2014   BUN 9 11/25/2014   CREATININE 0.53 11/25/2014   BILITOT 0.6 11/25/2014   ALKPHOS 31 (L) 11/25/2014   AST 24 11/25/2014   ALT 23 11/25/2014   PROT 6.6 11/25/2014   ALBUMIN 4.3 11/25/2014   CALCIUM 9.4 11/25/2014   GFR 136.91 11/25/2014   Lab Results  Component Value Date   CHOL 135 11/25/2014   Lab Results  Component Value Date   HDL 49.20 11/25/2014   Lab Results  Component Value Date   LDLCALC 79 11/25/2014   Lab Results  Component Value Date   TRIG 35.0 11/25/2014   Lab Results  Component Value Date   CHOLHDL 3 11/25/2014   Lab Results  Component Value Date   HGBA1C 5.2 11/25/2014   I acted as a Education administrator for Dr. Charlett Blake. Princess, RMA     Assessment & Plan:   Problem List Items Addressed This Visit    Preventative health care    Patient encouraged to maintain heart healthy diet, regular exercise, adequate sleep. Consider daily probiotics. Take medications as prescribed, patient questions if she has a mild case of SAD but is unable to take an SSRI due to nursing at this time so will consider next year if necessary      Relevant Orders   TSH   CBC   Comprehensive metabolic panel   Lipid panel   Hyperglycemia     minimize simple carbs. Increase exercise as tolerated. Has a h/o GDM      Relevant Orders   Hemoglobin A1c   Dyslipidemia    Mild but patient endorsing poor eating  habits since delivery of 3 rd child and less exercise so will check FLP today         I have discontinued Ms. Hobbs's oxyCODONE-acetaminophen, mupirocin ointment, and sulfamethoxazole-trimethoprim. I am also having her maintain her Multiple Vitamins-Minerals (MULTIVITAMIN ADULT PO), Magnesium, Vitamin D (Cholecalciferol), and ibuprofen.  No orders of the defined types were placed in this encounter.    Penni Homans, MD

## 2016-06-09 ENCOUNTER — Telehealth: Payer: Self-pay | Admitting: Family Medicine

## 2016-06-09 NOTE — Telephone Encounter (Signed)
Labs printed and mailed to her home.

## 2016-06-09 NOTE — Telephone Encounter (Signed)
Patient called stating that her MyChart has gotten messed up somehow and she spoke with IT and they stated that it will be over a week before it is able to be fixed. Since her Mychart is messed up she is not able to see any of her lab results at all from any time. She is requesting that Dr. Charlett Blake mail (or email) her all of last years and this years lab results. Address on file is correct. Please advise.

## 2016-06-29 DIAGNOSIS — L814 Other melanin hyperpigmentation: Secondary | ICD-10-CM | POA: Diagnosis not present

## 2016-10-12 DIAGNOSIS — S81852A Open bite, left lower leg, initial encounter: Secondary | ICD-10-CM | POA: Diagnosis not present

## 2016-10-13 NOTE — Telephone Encounter (Signed)
Talked to patient and she is has not tried to log onto mychart since dec when IT told her they would fix it and call her back. I told patient to log on she was not able to do it at this time but she will call me back .

## 2016-10-13 NOTE — Telephone Encounter (Signed)
done

## 2016-10-13 NOTE — Telephone Encounter (Signed)
This patient is having mychart problems.  She talked to IT for mychart and was told her interface looked different and we would need to put in a ticket.  Not sure what to do regarding this.  She stated she would like this taken care of and someone to call her when done.

## 2016-10-13 NOTE — Telephone Encounter (Signed)
Pt states that she went thru mychart help desk but still not able to access info. Pt asking if all labs can be re-released to mychart to see if it will work now. If it will not can we submit a new ticket to mychart for her?

## 2016-10-14 ENCOUNTER — Encounter: Payer: Self-pay | Admitting: Internal Medicine

## 2016-10-14 ENCOUNTER — Ambulatory Visit (INDEPENDENT_AMBULATORY_CARE_PROVIDER_SITE_OTHER): Payer: BLUE CROSS/BLUE SHIELD | Admitting: Internal Medicine

## 2016-10-14 VITALS — BP 118/70 | HR 70 | Temp 98.0°F | Resp 14 | Ht 63.5 in | Wt 122.0 lb

## 2016-10-14 DIAGNOSIS — W540XXD Bitten by dog, subsequent encounter: Secondary | ICD-10-CM

## 2016-10-14 DIAGNOSIS — D235 Other benign neoplasm of skin of trunk: Secondary | ICD-10-CM

## 2016-10-14 NOTE — Progress Notes (Signed)
Pre visit review using our clinic review tool, if applicable. No additional management support is needed unless otherwise documented below in the visit note. 

## 2016-10-14 NOTE — Progress Notes (Signed)
Subjective:    Patient ID: Sandra Long, female    DOB: 09/15/1975, 41 y.o.   MRN: [id number]  DOS:  10/14/2016 Type of visit - description : Acute Interval history: 4- 18 - 2018, suffered a dog bite at the left tight. Went to urgent care, prescribed antibiotics. Has not take them because she is breast-feeding and she really does not like to take antibiotic.  Here for a checkup.  Also, has a skin lesion at the left abdomen, already seen by PCP and dermatology and thought to be benign. Likes my opinion.  Review of Systems  Denies fever, chills, discharge. The dog is well-known, reportedly up-to-date on vaccinations and is actually under the care of animal control. Patient is up-to-date on her tetanus shot.  Past Medical History:  Diagnosis Date  . Anemia 06/07/2016  . Cervical disc disease 11/25/2014   MRI 2014  1. Moderate left central and foraminal stenosis due to a large left lateral recess disc protrusion at C6-7, potentially with a small amount of marginal blood products - correlate with chronicity of onset of patient's symptoms. 2. There is also moderate right eccentric central stenosis at the C5-6 level due to a right paracentral disc protrusion. 3. Chronic ethmoid sinusitis. 4. Mild   . Dyslipidemia 06/07/2016  . Eye disease 12/07/2014   Macular rosacea  . Gestational diabetes 2009  . Hyperglycemia 06/07/2016  . Palpitations 11/25/2014  . Paresthesia 11/25/2014  . Preventative health care 12/07/2014    Past Surgical History:  Procedure Laterality Date  . CESAREAN SECTION  2007  . CESAREAN SECTION N/A 09/08/2015   Procedure: Repeat CESAREAN SECTION;  Surgeon: Brien Few, MD;  Location: [facility];  Service: Obstetrics;  Laterality: N/A;  EDD: 09/14/15  . WISDOM TOOTH EXTRACTION      Social History   Social History  . Marital status: Married    Spouse name: N/A  . Number of children: N/A  . Years of education: N/A   Occupational History  . RN [employer]    Social History Main Topics  . Smoking status: Never Smoker  . Smokeless tobacco: Never Used  . Alcohol use 0.0 oz/week  . Drug use: No  . Sexual activity: Not on file     Comment: lives with husband, no red meat, works as Therapist, sports at [employer]   Other Topics Concern  . Not on file   Social History Narrative  . No narrative on file      Allergies as of 10/14/2016   No Known Allergies     Medication List       Accurate as of 10/14/16  9:01 AM. Always use your most recent med list.          amoxicillin-clavulanate 875-125 MG tablet Commonly known as:  AUGMENTIN Take 1 tablet by mouth 2 (two) times daily.   ibuprofen 600 MG tablet Commonly known as:  ADVIL,MOTRIN Take 1 tablet (600 mg total) by mouth every 6 (six) hours.   Magnesium 200 MG Tabs Take 400 mg by mouth daily.   MULTIVITAMIN ADULT PO Take 1 tablet by mouth daily.   Vitamin D (Cholecalciferol) 1000 units Tabs Take 2,000 Units by mouth daily.          Objective:   Physical Exam  Skin:      BP 118/70 (BP Location: Left Arm, Patient Position: Sitting, Cuff Size: Small)   Pulse 70   Temp 98 F (36.7 C) (Oral)  Resp 14   Ht 5' 3.5" (1.613 m)   Wt 122 lb (55.3 kg)   SpO2 98%   BMI 21.27 kg/m  General:   Well developed, well nourished . NAD.  HEENT:  Normocephalic . Face symmetric, atraumatic  Skin: Left thigh, see picture. Abdomen: See graphic. Neurologic:  alert & oriented X3.  Speech normal, gait appropriate for age and unassisted Psych--  Cognition and judgment appear intact.  Cooperative with normal attention span and concentration.  Behavior appropriate. No anxious or depressed appearing.           Assessment & Plan:   41 year old lady with a history of hyperglycemia, dyslipidemia, cervical spine disease, here with the following issues: Dog bite: u-t-d on Tds, the dog is under the care of animal control. She is very reluctant to take antibiotics  because she is breast-feeding. Today,, I see no evidence of infection but she is at risk. We agreed that she will watch for signs of infection, if they develop, will call the office and start antibiotics. Local care with topical antibiotics, soap and water encouraged Dermatofibroma? See skin lesions, already seen by PCP and dermatology and felt to be benign. I agree, recommend to call if there is any changes.

## 2016-10-14 NOTE — Patient Instructions (Signed)
Keep an eye on the wound  If you notice any redness, swelling, discharge, pocket of infection: Start antibiotics and come back to the office for a checkup.

## 2016-10-20 ENCOUNTER — Encounter: Payer: Self-pay | Admitting: Family Medicine

## 2016-10-20 ENCOUNTER — Ambulatory Visit (INDEPENDENT_AMBULATORY_CARE_PROVIDER_SITE_OTHER): Payer: BLUE CROSS/BLUE SHIELD | Admitting: Family Medicine

## 2016-10-20 DIAGNOSIS — F53 Puerperal psychosis: Secondary | ICD-10-CM | POA: Diagnosis not present

## 2016-10-20 DIAGNOSIS — Z9114 Patient's other noncompliance with medication regimen: Secondary | ICD-10-CM

## 2016-10-20 DIAGNOSIS — Z0000 Encounter for general adult medical examination without abnormal findings: Secondary | ICD-10-CM

## 2016-10-20 DIAGNOSIS — S7012XD Contusion of left thigh, subsequent encounter: Secondary | ICD-10-CM

## 2016-10-20 DIAGNOSIS — W540XXD Bitten by dog, subsequent encounter: Secondary | ICD-10-CM | POA: Diagnosis not present

## 2016-10-20 DIAGNOSIS — W540XXA Bitten by dog, initial encounter: Secondary | ICD-10-CM

## 2016-10-20 HISTORY — DX: Bitten by dog, initial encounter: W54.0XXA

## 2016-10-20 NOTE — Assessment & Plan Note (Addendum)
Left leg, healing well despite not taking antibiotics. Encouraged to continue same care and consider a massage. Call with any concerns or signs of lesion worsening

## 2016-10-20 NOTE — Progress Notes (Signed)
Patient ID: Sandra Long, female   DOB: Jun 16, 1976, 40 y.o.   MRN: [id number]   Subjective:  I acted as a Education administrator for Penni Homans, MD. Raiford Noble, [place]   Patient ID: Sandra Long, female    DOB: 1975-09-22, 41 y.o.   MRN: [id number]  Chief Complaint  Patient presents with  . Follow-up    Dog Bite.    Animal Bite   The incident occurred more than 2 days ago. The incident occurred at home. She came to the ER via personal transport. There is an injury to the left hip. The pain is mild. Pertinent negatives include no chest pain, no vomiting, no headaches, no loss of consciousness and no cough. There have been no prior injuries to these areas. Her tetanus status is UTD (06/28/2015). There were sick contacts at home. Recent Medical Care: Urgent Care. Services received include medications given (Augmentin.).    Patient is in today for a follow up for a dog bite. Patient states that she did not take the prescribed antibiotic. Patient has a Hx of anemia, hyperglycemia, dyslipidemia. Patient has no acute concerns noted at this time. She has been cleaning the wound regularly but she has chosen not to take the antibiotic due to breast feeding concerns. She reports pain improves each day and she denies any fevers or malaise. She does endorse some left sided knee pain but it is improving and there is no notable swelling or warmth or redness. Denies CP/palp/SOB/HA/congestion/fevers/GI or GU c/o. Taking meds as prescribed  Patient Care Team: Mosie Lukes, MD as PCP - General (Family Medicine) Brien Few, MD as Consulting Physician (Obstetrics and Gynecology)   Past Medical History:  Diagnosis Date  . Anemia 06/07/2016  . Cervical disc disease 11/25/2014   MRI 2014  1. Moderate left central and foraminal stenosis due to a large left lateral recess disc protrusion at C6-7, potentially with a small amount of marginal blood products - correlate with chronicity of onset of patient's symptoms. 2. There is  also moderate right eccentric central stenosis at the C5-6 level due to a right paracentral disc protrusion. 3. Chronic ethmoid sinusitis. 4. Mild   . Dog bite 10/20/2016  . Dyslipidemia 06/07/2016  . Eye disease 12/07/2014   Macular rosacea  . Gestational diabetes 2009  . Hyperglycemia 06/07/2016  . Palpitations 11/25/2014  . Paresthesia 11/25/2014  . Preventative health care 12/07/2014    Past Surgical History:  Procedure Laterality Date  . CESAREAN SECTION  2007  . CESAREAN SECTION N/A 09/08/2015   Procedure: Repeat CESAREAN SECTION;  Surgeon: Brien Few, MD;  Location: [facility];  Service: Obstetrics;  Laterality: N/A;  EDD: 09/14/15  . WISDOM TOOTH EXTRACTION      Family History  Problem Relation Age of Onset  . Heart disease Mother     2 stents 2015  . Tremor Mother     benign essential  . Heart disease Father     stents  . Diabetes Father     58  . Stroke Father   . Gout Father   . Blindness Father   . Gout Brother   . COPD Son   . Cancer Maternal Grandfather     Multiple Myeloma  . Stroke Paternal Grandmother   . Cancer Paternal Grandfather     bladder cancer    Social History   Social History  . Marital status: Married    Spouse name: N/A  . Number of children: N/A  . Years of  education: N/A   Occupational History  . RN [employer]    Social History Main Topics  . Smoking status: Never Smoker  . Smokeless tobacco: Never Used  . Alcohol use 0.0 oz/week  . Drug use: No  . Sexual activity: Not on file     Comment: lives with husband, no red meat, works as Therapist, sports at [employer]   Other Topics Concern  . Not on file   Social History Narrative  . No narrative on file    Outpatient Medications Prior to Visit  Medication Sig Dispense Refill  . ibuprofen (ADVIL,MOTRIN) 600 MG tablet Take 1 tablet (600 mg total) by mouth every 6 (six) hours. 30 tablet 0  . Magnesium 200 MG TABS Take 400 mg by mouth daily.    . Multiple  Vitamins-Minerals (MULTIVITAMIN ADULT PO) Take 1 tablet by mouth daily.    . Vitamin D, Cholecalciferol, 1000 UNITS TABS Take 2,000 Units by mouth daily.    Marland Kitchen amoxicillin-clavulanate (AUGMENTIN) 875-125 MG tablet Take 1 tablet by mouth 2 (two) times daily.     No facility-administered medications prior to visit.     No Known Allergies  Review of Systems  Constitutional: Negative for fever and malaise/fatigue.  HENT: Negative for congestion.   Eyes: Negative for blurred vision.  Respiratory: Negative for cough and shortness of breath.   Cardiovascular: Negative for chest pain, palpitations and leg swelling.  Gastrointestinal: Negative for vomiting.  Musculoskeletal: Positive for myalgias. Negative for back pain.  Skin: Negative for rash.  Neurological: Negative for loss of consciousness and headaches.  Psychiatric/Behavioral: Positive for depression. The patient is nervous/anxious.        Objective:    Physical Exam  Constitutional: She is oriented to person, place, and time. She appears well-developed and well-nourished. No distress.  HENT:  Head: Normocephalic and atraumatic.  Eyes: Conjunctivae are normal.  Neck: Normal range of motion. No thyromegaly present.  Cardiovascular: Normal rate and regular rhythm.   Pulmonary/Chest: Effort normal and breath sounds normal. She has no wheezes.  Abdominal: Soft. Bowel sounds are normal. There is no tenderness.  Musculoskeletal: She exhibits no edema or deformity.  Neurological: She is alert and oriented to person, place, and time.  Skin: Skin is warm and dry. She is not diaphoretic. There is erythema.  Left thigh lateral contusion, 2 well healing puncture wounds scabbed over. Slight surrounding ecchymosis no erythema  Psychiatric: She has a normal mood and affect.    BP 108/65 (BP Location: Left Arm, Patient Position: Sitting, Cuff Size: Normal)   Pulse 71   Temp 98.6 F (37 C) (Oral)   Wt 126 lb 6.4 oz (57.3 kg)   SpO2 98%  Comment: RA  BMI 22.04 kg/m  Wt Readings from Last 3 Encounters:  10/20/16 126 lb 6.4 oz (57.3 kg)  10/14/16 122 lb (55.3 kg)  06/07/16 121 lb 9.6 oz (55.2 kg)   BP Readings from Last 3 Encounters:  10/20/16 108/65  10/14/16 118/70  06/07/16 100/68     Immunization History  Administered Date(s) Administered  . Influenza-Unspecified 03/27/2014, 03/27/2016  . Pneumococcal Polysaccharide-23 06/28/2007  . Td 06/28/2007  . Tdap 10/26/2010, 06/18/2012, 06/28/2015    Health Maintenance  Topic Date Due  . PAP SMEAR  07/29/2011  . INFLUENZA VACCINE  01/25/2017  . TETANUS/TDAP  06/27/2025  . HIV Screening  Completed    Lab Results  Component Value Date   WBC 5.1 06/07/2016   HGB 13.3 06/07/2016  HCT 38.8 06/07/2016   PLT 224.0 06/07/2016   GLUCOSE 88 06/07/2016   CHOL 132 06/07/2016   TRIG 35.0 06/07/2016   HDL 49.60 06/07/2016   LDLCALC 76 06/07/2016   ALT 28 06/07/2016   AST 29 06/07/2016   NA 140 06/07/2016   K 3.8 06/07/2016   CL 105 06/07/2016   CREATININE 0.57 06/07/2016   BUN 13 06/07/2016   CO2 26 06/07/2016   TSH 2.21 06/07/2016   HGBA1C 5.5 06/07/2016    Lab Results  Component Value Date   TSH 2.21 06/07/2016   Lab Results  Component Value Date   WBC 5.1 06/07/2016   HGB 13.3 06/07/2016   HCT 38.8 06/07/2016   MCV 91.0 06/07/2016   PLT 224.0 06/07/2016   Lab Results  Component Value Date   NA 140 06/07/2016   K 3.8 06/07/2016   CO2 26 06/07/2016   GLUCOSE 88 06/07/2016   BUN 13 06/07/2016   CREATININE 0.57 06/07/2016   BILITOT 0.7 06/07/2016   ALKPHOS 61 06/07/2016   AST 29 06/07/2016   ALT 28 06/07/2016   PROT 6.7 06/07/2016   ALBUMIN 4.3 06/07/2016   CALCIUM 9.1 06/07/2016   GFR 124.89 06/07/2016   Lab Results  Component Value Date   CHOL 132 06/07/2016   Lab Results  Component Value Date   HDL 49.60 06/07/2016   Lab Results  Component Value Date   LDLCALC 76 06/07/2016   Lab Results  Component Value Date   TRIG 35.0  06/07/2016   Lab Results  Component Value Date   CHOLHDL 3 06/07/2016   Lab Results  Component Value Date   HGBA1C 5.5 06/07/2016         Assessment & Plan:   Problem List Items Addressed This Visit    Preventative health care    Still struggles with patient poor sleep and PP depression is doing well enough and does not want to take an SSRI while breast feeding will restart counseling and call if worsens      Dog bite    Left leg, healing well despite taking antibiotics. Encouraged to continue same care and consider a massage. Call with any concerns or signs of lesion worsening         I have discontinued Ms. Troiano's amoxicillin-clavulanate. I am also having her maintain her Multiple Vitamins-Minerals (MULTIVITAMIN ADULT PO), Magnesium, Vitamin D (Cholecalciferol), and ibuprofen.  No orders of the defined types were placed in this encounter.   CMA served as Education administrator during this visit. History, Physical and Plan performed by medical provider. Documentation and orders reviewed and attested to.  Penni Homans, MD

## 2016-10-20 NOTE — Progress Notes (Signed)
Pre visit review using our clinic review tool, if applicable. No additional management support is needed unless otherwise documented below in the visit note. 

## 2016-10-20 NOTE — Assessment & Plan Note (Signed)
Still struggles with patient poor sleep and PP depression is doing well enough and does not want to take an SSRI while breast feeding will restart counseling and call if worsens

## 2016-10-20 NOTE — Patient Instructions (Signed)
Encouraged to put Vitamin E on the bite once the scab has completely formed. Animal Bite Animal bite wounds can get infected. It is important to get proper medical treatment. Ask your doctor if you need rabies treatment. Follow these instructions at home: Wound care   Follow instructions from your doctor about how to take care of your wound. Make sure you:  Wash your hands with soap and water before you change your bandage (dressing). If you cannot use soap and water, use hand sanitizer.  Change your bandage as told by your doctor.  Leave stitches (sutures), skin glue, or skin tape (adhesive) strips in place. They may need to stay in place for 2 weeks or longer. If tape strips get loose and curl up, you may trim the loose edges. Do not remove tape strips completely unless your doctor says it is okay.  Check your wound every day for signs of infection. Watch for:  Redness, swelling, or pain that gets worse.  Fluid, blood, or pus. General instructions   Take or apply over-the-counter and prescription medicines only as told by your doctor.  If you were prescribed an antibiotic, take or apply it as told by your doctor. Do not stop using the antibiotic even if your condition improves.  Keep the injured area raised (elevated) above the level of your heart while you are sitting or lying down.  If directed, apply ice to the injured area.  Put ice in a plastic bag.  Place a towel between your skin and the bag.  Leave the ice on for 20 minutes, 2-3 times per day.  Keep all follow-up visits as told by your doctor. This is important. Contact a doctor if:  You have redness, swelling, or pain that gets worse.  You have a general feeling of sickness (malaise).  You feel sick to your stomach (nauseous).  You throw up (vomit).  You have pain that does not get better. Get help right away if:  You have a red streak going away from your wound.  You have fluid, blood, or pus coming from  your wound.  You have a fever or chills.  You have trouble moving your injured area.  You have numbness or tingling anywhere on your body. This information is not intended to replace advice given to you by your health care provider. Make sure you discuss any questions you have with your health care provider. Document Released: 06/13/2005 Document Revised: 11/19/2015 Document Reviewed: 10/29/2014 Elsevier Interactive Patient Education  2017 [employer].

## 2017-01-18 DIAGNOSIS — D225 Melanocytic nevi of trunk: Secondary | ICD-10-CM | POA: Diagnosis not present

## 2017-01-18 DIAGNOSIS — L821 Other seborrheic keratosis: Secondary | ICD-10-CM | POA: Diagnosis not present

## 2017-01-18 DIAGNOSIS — D171 Benign lipomatous neoplasm of skin and subcutaneous tissue of trunk: Secondary | ICD-10-CM | POA: Diagnosis not present

## 2017-01-18 DIAGNOSIS — Z86018 Personal history of other benign neoplasm: Secondary | ICD-10-CM | POA: Diagnosis not present

## 2017-02-23 ENCOUNTER — Encounter: Payer: Self-pay | Admitting: Family Medicine

## 2017-02-23 ENCOUNTER — Ambulatory Visit (INDEPENDENT_AMBULATORY_CARE_PROVIDER_SITE_OTHER): Payer: BLUE CROSS/BLUE SHIELD | Admitting: Family Medicine

## 2017-02-23 ENCOUNTER — Telehealth: Payer: Self-pay | Admitting: Family Medicine

## 2017-02-23 VITALS — BP 108/68 | HR 68 | Temp 98.4°F | Ht 63.5 in | Wt 124.1 lb

## 2017-02-23 DIAGNOSIS — H1013 Acute atopic conjunctivitis, bilateral: Secondary | ICD-10-CM | POA: Diagnosis not present

## 2017-02-23 DIAGNOSIS — M79642 Pain in left hand: Secondary | ICD-10-CM

## 2017-02-23 DIAGNOSIS — M79641 Pain in right hand: Secondary | ICD-10-CM | POA: Diagnosis not present

## 2017-02-23 DIAGNOSIS — M79644 Pain in right finger(s): Secondary | ICD-10-CM

## 2017-02-23 LAB — SEDIMENTATION RATE: Sed Rate: 1 mm/hr (ref 0–20)

## 2017-02-23 LAB — URIC ACID: URIC ACID, SERUM: 5.9 mg/dL (ref 2.4–7.0)

## 2017-02-23 MED ORDER — OLOPATADINE HCL 0.2 % OP SOLN: OPHTHALMIC | 0 refills | Status: DC

## 2017-02-23 NOTE — Progress Notes (Signed)
Musculoskeletal Exam  Patient: Sandra Long DOB: 1975-12-29  DOS: 02/23/2017  SUBJECTIVE:  Chief Complaint:   Chief Complaint  Patient presents with  . Joint Swelling    Sandra Long is a 41 y.o.  female for evaluation and treatment of b/l hand, wrist and ankle pain.   Onset:  1 month ago. Seemed to be related to yoga.  Location: R IP thumb, wrist, joints of digits, R ankle Character:  aching  Progression of issue:  is unchanged Associated symptoms: thumb appears to be better Treatment: to date has been rest.   Neurovascular symptoms: no +Famhx of gout, RA  Also mentioned that her allergies are flaring and giving her crusty and itchy eyes. She is still breast feeding and does not want to take PO antihistamines.  ROS: Musculoskeletal/Extremities: As noted in HPI Neurologic: no numbness, tingling no weakness   Past Medical History:  Diagnosis Date  . Anemia 06/07/2016  . Cervical disc disease 11/25/2014   MRI 2014  1. Moderate left central and foraminal stenosis due to a large left lateral recess disc protrusion at C6-7, potentially with a small amount of marginal blood products - correlate with chronicity of onset of patient's symptoms. 2. There is also moderate right eccentric central stenosis at the C5-6 level due to a right paracentral disc protrusion. 3. Chronic ethmoid sinusitis. 4. Mild   . Dog bite 10/20/2016  . Dyslipidemia 06/07/2016  . Eye disease 12/07/2014   Macular rosacea  . Gestational diabetes 2009  . Hyperglycemia 06/07/2016  . Palpitations 11/25/2014  . Paresthesia 11/25/2014  . Preventative health care 12/07/2014   Past Surgical History:  Procedure Laterality Date  . CESAREAN SECTION  2007  . CESAREAN SECTION N/A 09/08/2015   Procedure: Repeat CESAREAN SECTION;  Surgeon: Brien Few, MD;  Location: [facility];  Service: Obstetrics;  Laterality: N/A;  EDD: 09/14/15  . WISDOM TOOTH EXTRACTION     Family History  Problem Relation Age of Onset  .  Heart disease Mother        2 stents 2015  . Tremor Mother        benign essential  . Heart disease Father        stents  . Diabetes Father        89  . Stroke Father   . Gout Father   . Blindness Father   . Gout Brother   . COPD Son   . Cancer Maternal Grandfather        Multiple Myeloma  . Stroke Paternal Grandmother   . Cancer Paternal Grandfather        bladder cancer   Current Outpatient Prescriptions  Medication Sig Dispense Refill  . ibuprofen (ADVIL,MOTRIN) 600 MG tablet Take 1 tablet (600 mg total) by mouth every 6 (six) hours. 30 tablet 0  . Magnesium 200 MG TABS Take 400 mg by mouth daily.    . Multiple Vitamins-Minerals (MULTIVITAMIN ADULT PO) Take 1 tablet by mouth daily.    . Vitamin D, Cholecalciferol, 1000 UNITS TABS Take 2,000 Units by mouth daily.     No Known Allergies Social History   Social History  . Marital status: Married   Occupational History  . RN [employer]    Social History Main Topics  . Smoking status: Never Smoker  . Smokeless tobacco: Never Used  . Alcohol use 0.0 oz/week  . Drug use: No  . Sexual activity: Not on file     Comment: lives  with husband, no red meat, works as Therapist, sports at [employer]   Objective: VITAL SIGNS: BP 108/68 (BP Location: Left Arm, Patient Position: Sitting, Cuff Size: Normal)   Pulse 68   Temp 98.4 F (36.9 C) (Oral)   Ht 5' 3.5" (1.613 m)   Wt 124 lb 2 oz (56.3 kg)   SpO2 97%   BMI 21.64 kg/m  Constitutional: Well formed, well developed. No acute distress. Cardiovascular: Brisk cap refill Eyes: PERRLA, sclera white, no discharge Thorax & Lungs: No accessory muscle use Extremities: No clubbing. No cyanosis. No edema.  Skin: Warm. Dry. No erythema. No rash.  Musculoskeletal: hands.   Normal active range of motion: yes.   Normal passive range of motion: yes Tenderness to palpation: mild TTP over 1st IP on R Deformity: no Ecchymosis: no No edema or effusions appreciated R  ankle- no effusion, no TTP or warmth, neg ant drawer, squeeze, good strength of joint Neurologic: Normal sensory function. No focal deficits noted. Sensation intact to light touch Psychiatric: Normal mood. Age appropriate judgment and insight. Alert & oriented x 3.    Assessment:  Allergic conjunctivitis of both eyes - Plan: Olopatadine HCl 0.2 % SOLN  Pain of right thumb - Plan: ANA,IFA RA Diag Pnl w/rflx Tit/Patn, Sed Rate (ESR), Uric acid  Pain in both hands - Plan: ANA,IFA RA Diag Pnl w/rflx Tit/Patn, Sed Rate (ESR), Uric acid  Plan: Orders as above. Ketotifen OTC as option for eyes. Will check AI labs given hx. NSAIDs/ice in meantime. F/u with PCP prn. The patient voiced understanding and agreement to the plan.   [place], DO 02/23/17  1:56 PM

## 2017-02-23 NOTE — Telephone Encounter (Addendum)
Relation to pt: self Call back number:[phone] Pharmacy: [id number] - [place], [place] [phone] (Phone) [phone] (Fax)     Reason for call:  Patient states pharmacy informed her insurance will not cover Olopatadine HCl 0.2 % SOLN PA, patient requesting alternate or Prior Auth is needed, please advise

## 2017-02-23 NOTE — Telephone Encounter (Signed)
NW-Patient was seen today and given an Rx for Olopatadine 0.2% solution/ins does not cover/plz advise of an alternative/thx dmf

## 2017-02-23 NOTE — Telephone Encounter (Signed)
Patient informed of response 

## 2017-02-23 NOTE — Telephone Encounter (Signed)
Ketotifen is available OTC and was written on her AVS. TY.

## 2017-02-23 NOTE — Patient Instructions (Addendum)
Ibuprofen 400-600 mg (2-3 over the counter strength tabs) every 6 hours as needed for pain.  Ice/cold pack over area for 10-15 min every 2-3 hours while awake.  There is no supplement or mineral that I have data that will be helpful.  Give [place] 4-5 business days to get the results of your labs back.   Wear supportive shoes.  Ketotifen drops twice daily may also help with your allergies and eyes.

## 2017-02-23 NOTE — Progress Notes (Signed)
Pre visit review using our clinic review tool, if applicable. No additional management support is needed unless otherwise documented below in the visit note. 

## 2017-02-24 LAB — ANA,IFA RA DIAG PNL W/RFLX TIT/PATN
Anti Nuclear Antibody(ANA): NEGATIVE
Cyclic Citrullin Peptide Ab: <16 Units
Rhuematoid fact SerPl-aCnc: <14 IU/mL (ref <14)

## 2017-05-25 ENCOUNTER — Telehealth: Payer: Self-pay

## 2017-05-25 ENCOUNTER — Other Ambulatory Visit: Payer: Self-pay | Admitting: Family Medicine

## 2017-05-25 DIAGNOSIS — M79641 Pain in right hand: Secondary | ICD-10-CM

## 2017-05-25 NOTE — Telephone Encounter (Signed)
patient is still having pain in right hand and elbow. Can she have a referral to see a orth or try PT.   Please advise

## 2017-05-29 ENCOUNTER — Ambulatory Visit (INDEPENDENT_AMBULATORY_CARE_PROVIDER_SITE_OTHER): Payer: BLUE CROSS/BLUE SHIELD | Admitting: Orthopaedic Surgery

## 2017-05-29 ENCOUNTER — Encounter (INDEPENDENT_AMBULATORY_CARE_PROVIDER_SITE_OTHER): Payer: Self-pay | Admitting: Orthopaedic Surgery

## 2017-05-29 ENCOUNTER — Ambulatory Visit (INDEPENDENT_AMBULATORY_CARE_PROVIDER_SITE_OTHER): Payer: BLUE CROSS/BLUE SHIELD

## 2017-05-29 DIAGNOSIS — M25521 Pain in right elbow: Secondary | ICD-10-CM | POA: Insufficient documentation

## 2017-05-29 MED ORDER — BUPIVACAINE HCL 0.5 % IJ SOLN
1.0000 mL | INTRAMUSCULAR | Status: AC | PRN
  Administered 2017-05-29: 1 mL via INTRA_ARTICULAR

## 2017-05-29 MED ORDER — LIDOCAINE HCL 1 % IJ SOLN
1.0000 mL | INTRAMUSCULAR | Status: AC | PRN
  Administered 2017-05-29: 1 mL

## 2017-05-29 MED ORDER — METHYLPREDNISOLONE ACETATE 40 MG/ML IJ SUSP
40.0000 mg | INTRAMUSCULAR | Status: AC | PRN
Start: 2017-05-29 — End: 2017-05-29
  Administered 2017-05-29: 40 mg via INTRA_ARTICULAR

## 2017-05-29 NOTE — Progress Notes (Signed)
Office Visit Note   Patient: Sandra Long           Date of Birth: December 27, 1975           MRN: [id number] Visit Date: 05/29/2017              Requested by: Mosie Lukes, MD [address] [place], [place] [address] PCP: Mosie Lukes, MD   Assessment & Plan: Visit Diagnoses:  1. Pain in right elbow     Plan: Impression is right elbow pain mainly lateral epicondylitis possibly medial epicondylitis.  Cortisone injection was performed today for the tennis elbow.  Patient had good relief during the anesthetic phase.  Prescription for physical therapy with modalities and iontophoresis/phonophoresis.  Patient has counterforce brace at home.  She has my card if she needs me.  Questions encouraged and answered.  Follow-up as needed. Total face to face encounter time was greater than 45 minutes and over half of this time was spent in counseling and/or coordination of care.  Follow-Up Instructions: Return if symptoms worsen or fail to improve.   Orders:  Orders Placed This Encounter  Procedures  . XR Elbow 2 Views Right   No orders of the defined types were placed in this encounter.     Procedures: Medium Joint Inj: R lateral epicondyle on 05/29/2017 11:29 AM Indications: pain Details: 25 G needle Medications: 1 mL lidocaine 1 %; 1 mL bupivacaine 0.5 %; 40 mg methylPREDNISolone acetate 40 MG/ML Outcome: tolerated well, no immediate complications Patient was prepped and draped in the usual sterile fashion.       Clinical Data: No additional findings.   Subjective: Chief Complaint  Patient presents with  . Right Elbow - Pain    Patient is a 41 year old who comes in with right elbow pain mainly lateral side.  She does complain of some medial elbow pain with extension of the elbow.  Denies any numbness or tingling.  Denies any neck pain.  She has tried massage therapy and her lateral epicondyle and common extensor tendon is tender to palpation.  Denies any  injuries.  She thinks this may have started after doing pull-ups.    Review of Systems  Constitutional: Negative.   HENT: Negative.   Eyes: Negative.   Respiratory: Negative.   Cardiovascular: Negative.   Endocrine: Negative.   Musculoskeletal: Negative.   Neurological: Negative.   Hematological: Negative.   Psychiatric/Behavioral: Negative.   All other systems reviewed and are negative.    Objective: Vital Signs: There were no vitals taken for this visit.  Physical Exam  Constitutional: She is oriented to person, place, and time. She appears well-developed and well-nourished.  HENT:  Head: Normocephalic and atraumatic.  Eyes: EOM are normal.  Neck: Neck supple.  Pulmonary/Chest: Effort normal.  Abdominal: Soft.  Neurological: She is alert and oriented to person, place, and time.  Skin: Skin is warm. Capillary refill takes less than 2 seconds.  Psychiatric: She has a normal mood and affect. Her behavior is normal. Judgment and thought content normal.  Nursing note and vitals reviewed.   Ortho Exam Right elbow exam shows no swelling or skin changes.  Lateral epicondyle and common extensor tendon are quite tender.  No significant pain with resisted middle finger extension.  Pain with resisted wrist extension.  Medial epicondyle is mildly tender.  Ulnar nerve is stable.  Negative [facility] Specialty Comments:  No specialty comments available.  Imaging: Xr Elbow 2 Views Right  Result Date: 05/29/2017 No acute or structural abnormalities.    PMFS History: Patient Active Problem List   Diagnosis Date Noted  . Pain in right elbow 05/29/2017  . Dog bite 10/20/2016  . Hyperglycemia 06/07/2016  . Anemia 06/07/2016  . Dyslipidemia 06/07/2016  . Preventative health care 12/07/2014  . Eye disease 12/07/2014  . Occasional tremors 12/07/2014  . Family history of anemia 12/07/2014  . Paresthesia 11/25/2014  . Cervical disc disease 11/25/2014  . Palpitations 11/25/2014  .  ALLERGIC RHINITIS, SEASONAL 10/28/2008  . TB SKIN TEST, POSITIVE 10/28/2008  . DIABETES MELLITUS, GESTATIONAL, HX OF 10/28/2008   Past Medical History:  Diagnosis Date  . Anemia 06/07/2016  . Cervical disc disease 11/25/2014   MRI 2014  1. Moderate left central and foraminal stenosis due to a large left lateral recess disc protrusion at C6-7, potentially with a small amount of marginal blood products - correlate with chronicity of onset of patient's symptoms. 2. There is also moderate right eccentric central stenosis at the C5-6 level due to a right paracentral disc protrusion. 3. Chronic ethmoid sinusitis. 4. Mild   . Dog bite 10/20/2016  . Dyslipidemia 06/07/2016  . Eye disease 12/07/2014   Macular rosacea  . Gestational diabetes 2009  . Hyperglycemia 06/07/2016  . Palpitations 11/25/2014  . Paresthesia 11/25/2014  . Preventative health care 12/07/2014    Family History  Problem Relation Age of Onset  . Heart disease Mother        2 stents 2015  . Tremor Mother        benign essential  . Heart disease Father        stents  . Diabetes Father        62  . Stroke Father   . Gout Father   . Blindness Father   . Gout Brother   . COPD Son   . Cancer Maternal Grandfather        Multiple Myeloma  . Stroke Paternal Grandmother   . Cancer Paternal Grandfather        bladder cancer    Past Surgical History:  Procedure Laterality Date  . CESAREAN SECTION  2007  . CESAREAN SECTION N/A 09/08/2015   Procedure: Repeat CESAREAN SECTION;  Surgeon: Brien Few, MD;  Location: [facility];  Service: Obstetrics;  Laterality: N/A;  EDD: 09/14/15  . WISDOM TOOTH EXTRACTION     Social History   Occupational History  . Occupation: RN [employer]  Tobacco Use  . Smoking status: Never Smoker  . Smokeless tobacco: Never Used  Substance and Sexual Activity  . Alcohol use: Yes    Alcohol/week: 0.0 oz  . Drug use: No  . Sexual activity: Not on file     Comment: lives with husband, no red meat, works as Therapist, sports at [employer]

## 2017-06-13 ENCOUNTER — Encounter: Payer: Self-pay | Admitting: Family Medicine

## 2017-09-07 DIAGNOSIS — H1033 Unspecified acute conjunctivitis, bilateral: Secondary | ICD-10-CM | POA: Diagnosis not present

## 2017-11-03 ENCOUNTER — Other Ambulatory Visit: Payer: Self-pay | Admitting: Family Medicine

## 2017-11-03 ENCOUNTER — Telehealth: Payer: Self-pay

## 2017-11-03 DIAGNOSIS — Z1239 Encounter for other screening for malignant neoplasm of breast: Secondary | ICD-10-CM

## 2017-11-03 NOTE — Telephone Encounter (Signed)
Copied from [place] [id number]. Topic: Referral - Request >> Nov 03, 2017  9:58 AM Aurelio Brash B wrote: Reason for CRM:PT is asking for referral to get a mammogram  she is requesting the higher definition one     Please advise

## 2017-11-03 NOTE — Telephone Encounter (Signed)
I have ordered the MGM at [facility] if she wants it elsewhere we will have to reorder

## 2017-11-10 ENCOUNTER — Ambulatory Visit (HOSPITAL_BASED_OUTPATIENT_CLINIC_OR_DEPARTMENT_OTHER)
Admission: RE | Admit: 2017-11-10 | Discharge: 2017-11-10 | Disposition: A | Discharge status: Home / Self Care | Payer: BLUE CROSS/BLUE SHIELD | Source: Ambulatory Visit | Attending: Family Medicine | Admitting: Family Medicine

## 2017-11-10 DIAGNOSIS — Z1231 Encounter for screening mammogram for malignant neoplasm of breast: Secondary | ICD-10-CM | POA: Insufficient documentation

## 2017-11-10 DIAGNOSIS — Z1239 Encounter for other screening for malignant neoplasm of breast: Secondary | ICD-10-CM

## 2017-11-29 ENCOUNTER — Telehealth: Payer: Self-pay | Admitting: Family Medicine

## 2017-11-29 NOTE — Telephone Encounter (Signed)
Copied from [place]. Topic: Inquiry >> Nov 29, 2017 11:57 AM Pricilla Handler wrote: Reason for CRM: Patient called stating that she would like Dr. Charlett Blake or her assistant to call her regarding starting a medication for ADD. Patient wants to start one asap. Please call patient at [phone].       Thank You!!! Please call patient and let her know she needs to see behavioral health to confirm a diagnosis of ADD unless she has documentation I can obtain from somewhere that has already been done

## 2017-11-29 NOTE — Telephone Encounter (Signed)
Copied from [place]. Topic: Inquiry >> Nov 29, 2017 11:57 AM Pricilla Handler wrote: Reason for CRM: Patient called stating that she would like Dr. Charlett Blake or her assistant to call her regarding starting a medication for ADD. Patient wants to start one asap. Please call patient at [phone].       Thank You!!!

## 2017-11-30 ENCOUNTER — Telehealth: Payer: Self-pay | Admitting: Family Medicine

## 2017-11-30 NOTE — Telephone Encounter (Signed)
Copied from [place] [id number]. Topic: Quick Communication - Office Called Patient >> Nov 30, 2017  1:14 PM Cecelia Byars, NT wrote: Reason for CRM: Patient says someone from the office called and did not leave a message ,she would like a another call and says it is ok for a detailed message to be left on VM, tnanks

## 2018-01-01 ENCOUNTER — Encounter: Payer: Self-pay | Admitting: Family Medicine

## 2018-01-01 ENCOUNTER — Ambulatory Visit (INDEPENDENT_AMBULATORY_CARE_PROVIDER_SITE_OTHER): Payer: BLUE CROSS/BLUE SHIELD | Admitting: Family Medicine

## 2018-01-01 ENCOUNTER — Encounter

## 2018-01-01 VITALS — BP 98/62 | HR 65 | Temp 98.2°F | Resp 18 | Ht 63.5 in | Wt 120.8 lb

## 2018-01-01 DIAGNOSIS — E785 Hyperlipidemia, unspecified: Secondary | ICD-10-CM

## 2018-01-01 DIAGNOSIS — R739 Hyperglycemia, unspecified: Secondary | ICD-10-CM

## 2018-01-01 DIAGNOSIS — F988 Other specified behavioral and emotional disorders with onset usually occurring in childhood and adolescence: Secondary | ICD-10-CM

## 2018-01-01 DIAGNOSIS — D649 Anemia, unspecified: Secondary | ICD-10-CM

## 2018-01-01 DIAGNOSIS — M2550 Pain in unspecified joint: Secondary | ICD-10-CM | POA: Diagnosis not present

## 2018-01-01 DIAGNOSIS — Z0000 Encounter for general adult medical examination without abnormal findings: Secondary | ICD-10-CM

## 2018-01-01 DIAGNOSIS — F4321 Adjustment disorder with depressed mood: Secondary | ICD-10-CM | POA: Diagnosis not present

## 2018-01-01 MED ORDER — METHYLPHENIDATE HCL 5 MG PO TABS
5.0000 mg | ORAL_TABLET | Freq: Two times a day (BID) | ORAL | 0 refills | Status: DC
Start: 2018-01-01 — End: 2018-02-13

## 2018-01-01 NOTE — Patient Instructions (Signed)
Preventive Care 18-39 Years, Female Preventive care refers to lifestyle choices and visits with your health care provider that can promote health and wellness. What does preventive care include?  A yearly physical exam. This is also called an annual well check.  Dental exams once or twice a year.  Routine eye exams. Ask your health care provider how often you should have your eyes checked.  Personal lifestyle choices, including: ? Daily care of your teeth and gums. ? Regular physical activity. ? Eating a healthy diet. ? Avoiding tobacco and drug use. ? Limiting alcohol use. ? Practicing safe sex. ? Taking vitamin and mineral supplements as recommended by your health care provider. What happens during an annual well check? The services and screenings done by your health care provider during your annual well check will depend on your age, overall health, lifestyle risk factors, and family history of disease. Counseling Your health care provider may ask you questions about your:  Alcohol use.  Tobacco use.  Drug use.  Emotional well-being.  Home and relationship well-being.  Sexual activity.  Eating habits.  Work and work Statistician.  Method of birth control.  Menstrual cycle.  Pregnancy history.  Screening You may have the following tests or measurements:  Height, weight, and BMI.  Diabetes screening. This is done by checking your blood sugar (glucose) after you have not eaten for a while (fasting).  Blood pressure.  Lipid and cholesterol levels. These may be checked every 5 years starting at age 66.  Skin check.  Hepatitis C blood test.  Hepatitis B blood test.  Sexually transmitted disease (STD) testing.  BRCA-related cancer screening. This may be done if you have a family history of breast, ovarian, tubal, or peritoneal cancers.  Pelvic exam and Pap test. This may be done every 3 years starting at age 40. Starting at age 59, this may be done every 5  years if you have a Pap test in combination with an HPV test.  Discuss your test results, treatment options, and if necessary, the need for more tests with your health care provider. Vaccines Your health care provider may recommend certain vaccines, such as:  Influenza vaccine. This is recommended every year.  Tetanus, diphtheria, and acellular pertussis (Tdap, Td) vaccine. You may need a Td booster every 10 years.  Varicella vaccine. You may need this if you have not been vaccinated.  HPV vaccine. If you are 69 or younger, you may need three doses over 6 months.  Measles, mumps, and rubella (MMR) vaccine. You may need at least one dose of MMR. You may also need a second dose.  Pneumococcal 13-valent conjugate (PCV13) vaccine. You may need this if you have certain conditions and were not previously vaccinated.  Pneumococcal polysaccharide (PPSV23) vaccine. You may need one or two doses if you smoke cigarettes or if you have certain conditions.  Meningococcal vaccine. One dose is recommended if you are age 27-21 years and a first-year college student living in a residence hall, or if you have one of several medical conditions. You may also need additional booster doses.  Hepatitis A vaccine. You may need this if you have certain conditions or if you travel or work in places where you may be exposed to hepatitis A.  Hepatitis B vaccine. You may need this if you have certain conditions or if you travel or work in places where you may be exposed to hepatitis B.  Haemophilus influenzae type b (Hib) vaccine. You may need this if  you have certain risk factors.  Talk to your health care provider about which screenings and vaccines you need and how often you need them. This information is not intended to replace advice given to you by your health care provider. Make sure you discuss any questions you have with your health care provider. Document Released: 08/09/2001 Document Revised: 03/02/2016  Document Reviewed: 04/14/2015 Elsevier Interactive Patient Education  [employer].

## 2018-01-01 NOTE — Progress Notes (Signed)
Subjective:  I acted as a Education administrator for Dr. Charlett Blake. Sandra Long, Sandra Long  Patient ID: Sandra Long, female    DOB: 07/04/1975, 42 y.o.   MRN: [id number]  Chief Complaint  Patient presents with  . Annual Exam    HPI  Patient is in today for an annual exam and follow up on chronic medical concerns including ADD, hyperglycemia and palpitations. She suffered the loss of father in April after he suffered a traumatic brain injury in January of 2019. She has started some counseling to manage her grief and feels that helps some. No recent febrile illness or hospitalizations. Is trying to maintain a heart healthy diet and stay active. Denies CP/palp/SOB/HA/congestion/fevers/GI or GU c/o. Taking meds as prescribed  Patient Care Team: Mosie Lukes, MD as PCP - General (Family Medicine) Brien Few, MD as Consulting Physician (Obstetrics and Gynecology)   Past Medical History:  Diagnosis Date  . Anemia 06/07/2016  . Cervical disc disease 11/25/2014   MRI 2014  1. Moderate left central and foraminal stenosis due to a large left lateral recess disc protrusion at C6-7, potentially with a small amount of marginal blood products - correlate with chronicity of onset of patient's symptoms. 2. There is also moderate right eccentric central stenosis at the C5-6 level due to a right paracentral disc protrusion. 3. Chronic ethmoid sinusitis. 4. Mild   . Dog bite 10/20/2016  . Dyslipidemia 06/07/2016  . Eye disease 12/07/2014   Macular rosacea  . Gestational diabetes 2009  . Hyperglycemia 06/07/2016  . Palpitations 11/25/2014  . Paresthesia 11/25/2014  . Preventative health care 12/07/2014    Past Surgical History:  Procedure Laterality Date  . CESAREAN SECTION  2007  . CESAREAN SECTION N/A 09/08/2015   Procedure: Repeat CESAREAN SECTION;  Surgeon: Brien Few, MD;  Location: [facility];  Service: Obstetrics;  Laterality: N/A;  EDD: 09/14/15  . WISDOM TOOTH EXTRACTION      Family History  Problem  Relation Age of Onset  . Heart disease Mother        2 stents 2015  . Tremor Mother        benign essential  . Heart disease Father        stents  . Diabetes Father        41  . Stroke Father   . Gout Father   . Blindness Father   . Gout Brother   . COPD Son   . Cancer Maternal Grandfather        Multiple Myeloma  . Stroke Paternal Grandmother   . Cancer Paternal Grandfather        bladder cancer    Social History   Socioeconomic History  . Marital status: Married    Spouse name: Not on file  . Number of children: Not on file  . Years of education: Not on file  . Highest education level: Not on file  Occupational History  . Occupation: RN [employer]  Social Needs  . Financial resource strain: Not on file  . Food insecurity:    Worry: Not on file    Inability: Not on file  . Transportation needs:    Medical: Not on file    Non-medical: Not on file  Tobacco Use  . Smoking status: Never Smoker  . Smokeless tobacco: Never Used  Substance and Sexual Activity  . Alcohol use: Yes    Alcohol/week: 0.0 oz  . Drug use: No  . Sexual activity: Yes  Comment: lives with husband, no red meat, works as Therapist, sports at [employer]  Lifestyle  . Physical activity:    Days per week: Not on file    Minutes per session: Not on file  . Stress: Not on file  Relationships  . Social connections:    Talks on phone: Not on file    Gets together: Not on file    Attends religious service: Not on file    Active member of club or organization: Not on file    Attends meetings of clubs or organizations: Not on file    Relationship status: Not on file  . Intimate partner violence:    Fear of current or ex partner: Not on file    Emotionally abused: Not on file    Physically abused: Not on file    Forced sexual activity: Not on file  Other Topics Concern  . Not on file  Social History Narrative  . Not on file    Outpatient Medications Prior to Visit    Medication Sig Dispense Refill  . ibuprofen (ADVIL,MOTRIN) 600 MG tablet Take 1 tablet (600 mg total) by mouth every 6 (six) hours. 30 tablet 0  . Magnesium 200 MG TABS Take 400 mg by mouth daily.    . Multiple Vitamins-Minerals (MULTIVITAMIN ADULT PO) Take 1 tablet by mouth daily.    . Olopatadine HCl 0.2 % SOLN Use one drop daily. 1 Bottle 0  . Vitamin D, Cholecalciferol, 1000 UNITS TABS Take 2,000 Units by mouth daily.     No facility-administered medications prior to visit.     No Known Allergies  Review of Systems  Constitutional: Negative for chills, fever, malaise/fatigue and weight loss.  HENT: Negative for congestion and hearing loss.   Eyes: Negative for discharge.  Respiratory: Negative for cough, sputum production and shortness of breath.   Cardiovascular: Negative for chest pain, palpitations and leg swelling.  Gastrointestinal: Negative for abdominal pain, blood in stool, constipation, diarrhea, heartburn, nausea and vomiting.  Genitourinary: Negative for dysuria, frequency, hematuria and urgency.  Musculoskeletal: Negative for back pain, falls and myalgias.  Skin: Negative for rash.  Neurological: Negative for dizziness, sensory change, loss of consciousness, weakness and headaches.  Endo/Heme/Allergies: Negative for environmental allergies. Does not bruise/bleed easily.  Psychiatric/Behavioral: Positive for depression. Negative for suicidal ideas. The patient is nervous/anxious. The patient does not have insomnia.        Objective:    Physical Exam  Constitutional: She is oriented to person, place, and time. She appears well-developed and well-nourished. No distress.  HENT:  Head: Normocephalic and atraumatic.  Nose: Nose normal.  Eyes: Right eye exhibits no discharge. Left eye exhibits no discharge.  Neck: Normal range of motion. Neck supple.  Cardiovascular: Normal rate and regular rhythm.  No murmur heard. Pulmonary/Chest: Effort normal and breath sounds  normal.  Abdominal: Soft. Bowel sounds are normal. There is no tenderness.  Musculoskeletal: She exhibits no edema.  Neurological: She is alert and oriented to person, place, and time.  Skin: Skin is warm and dry.  Psychiatric: She has a normal mood and affect.  Nursing note and vitals reviewed.   BP 98/62 (BP Location: Left Arm, Patient Position: Sitting, Cuff Size: Normal)   Pulse 65   Temp 98.2 F (36.8 C) (Oral)   Resp 18   Ht 5' 3.5" (1.613 m)   Wt 120 lb 12.8 oz (54.8 kg)   SpO2 98%   BMI 21.06 kg/m  Wt Readings from Last 3 Encounters:  01/01/18 120 lb 12.8 oz (54.8 kg)  02/23/17 124 lb 2 oz (56.3 kg)  10/20/16 126 lb 6.4 oz (57.3 kg)   BP Readings from Last 3 Encounters:  01/01/18 98/62  02/23/17 108/68  10/20/16 108/65     Immunization History  Administered Date(s) Administered  . Influenza-Unspecified 03/27/2014, 03/27/2016  . Pneumococcal Polysaccharide-23 06/28/2007  . Td 06/28/2007  . Tdap 10/26/2010, 06/18/2012, 06/28/2015    Health Maintenance  Topic Date Due  . PAP SMEAR  07/29/2011  . INFLUENZA VACCINE  01/25/2018  . TETANUS/TDAP  06/27/2025  . HIV Screening  Completed    Lab Results  Component Value Date   WBC 5.7 01/01/2018   HGB 12.8 01/01/2018   HCT 38.5 01/01/2018   PLT 203.0 01/01/2018   GLUCOSE 89 01/01/2018   CHOL 147 01/01/2018   TRIG 53.0 01/01/2018   HDL 53.00 01/01/2018   LDLCALC 83 01/01/2018   ALT 19 01/01/2018   AST 23 01/01/2018   NA 141 01/01/2018   K 4.8 01/01/2018   CL 104 01/01/2018   CREATININE 0.54 01/01/2018   BUN 8 01/01/2018   CO2 31 01/01/2018   TSH 1.44 01/01/2018   HGBA1C 5.8 01/01/2018    Lab Results  Component Value Date   TSH 1.44 01/01/2018   Lab Results  Component Value Date   WBC 5.7 01/01/2018   HGB 12.8 01/01/2018   HCT 38.5 01/01/2018   MCV 93.4 01/01/2018   PLT 203.0 01/01/2018   Lab Results  Component Value Date   NA 141 01/01/2018   K 4.8 01/01/2018   CO2 31 01/01/2018    GLUCOSE 89 01/01/2018   BUN 8 01/01/2018   CREATININE 0.54 01/01/2018   BILITOT 0.6 01/01/2018   ALKPHOS 31 (L) 01/01/2018   AST 23 01/01/2018   ALT 19 01/01/2018   PROT 6.6 01/01/2018   ALBUMIN 4.3 01/01/2018   CALCIUM 9.2 01/01/2018   GFR 131.89 01/01/2018   Lab Results  Component Value Date   CHOL 147 01/01/2018   Lab Results  Component Value Date   HDL 53.00 01/01/2018   Lab Results  Component Value Date   LDLCALC 83 01/01/2018   Lab Results  Component Value Date   TRIG 53.0 01/01/2018   Lab Results  Component Value Date   CHOLHDL 3 01/01/2018   Lab Results  Component Value Date   HGBA1C 5.8 01/01/2018         Assessment & Plan:   Problem List Items Addressed This Visit    Preventative health care    Patient encouraged to maintain heart healthy diet, regular exercise, adequate sleep. Consider daily probiotics. Take medications as prescribed      Relevant Orders   Comprehensive metabolic panel (Completed)   TSH (Completed)   Hyperglycemia - Primary    hgba1c acceptable, minimize simple carbs. Increase exercise as tolerated. Continue current meds      Relevant Orders   Hemoglobin A1c (Completed)   Anemia   Relevant Orders   CBC (Completed)   Dyslipidemia    Encouraged heart healthy diet, increase exercise, avoid trans fats, consider a krill oil cap daily      Relevant Orders   Lipid panel (Completed)   Attention deficit disorder    Has undergone formal evaluation with [facility] to confirm diagnosis. Is started on Ritalin 5 mg po bid and possible side effects discussed. Follow up in 4 weeks      Feeling grief    Lost her father in April  of 2019 from a TBi he suffered in January she has begun counseling and is managing well most days. May consider meds in the future       Other Visit Diagnoses    Arthralgia, unspecified joint       Relevant Orders   Uric acid (Completed)      I am having Sandra Long. Gest start on methylphenidate. I am also  having her maintain her Multiple Vitamins-Minerals (MULTIVITAMIN ADULT PO), Magnesium, Vitamin D (Cholecalciferol), ibuprofen, and Olopatadine HCl.  Meds ordered this encounter  Medications  . methylphenidate (RITALIN) 5 MG tablet    Sig: Take 1 tablet (5 mg total) by mouth 2 (two) times daily.    Dispense:  60 tablet    Refill:  0    CMA served as scribe during this visit. History, Physical and Plan performed by medical provider. Documentation and orders reviewed and attested to.  Penni Homans, MD

## 2018-01-02 LAB — COMPREHENSIVE METABOLIC PANEL
ALT: 19 U/L (ref 0–35)
AST: 23 U/L (ref 0–37)
Albumin: 4.3 g/dL (ref 3.5–5.2)
Alkaline Phosphatase: 31 U/L — ABNORMAL LOW (ref 39–117)
BUN: 8 mg/dL (ref 6–23)
CALCIUM: 9.2 mg/dL (ref 8.4–10.5)
CHLORIDE: 104 meq/L (ref 96–112)
CO2: 31 mEq/L (ref 19–32)
Creatinine, Ser: 0.54 mg/dL (ref 0.40–1.20)
GFR: 131.89 mL/min (ref >60.00)
Glucose, Bld: 89 mg/dL (ref 70–99)
POTASSIUM: 4.8 meq/L (ref 3.5–5.1)
Sodium: 141 mEq/L (ref 135–145)
Total Bilirubin: 0.6 mg/dL (ref 0.2–1.2)
Total Protein: 6.6 g/dL (ref 6.0–8.3)

## 2018-01-02 LAB — LIPID PANEL
CHOLESTEROL: 147 mg/dL (ref 0–200)
HDL: 53 mg/dL (ref >39.00)
LDL CALC: 83 mg/dL (ref 0–99)
NonHDL: 93.77
Total CHOL/HDL Ratio: 3
Triglycerides: 53 mg/dL (ref 0.0–149.0)
VLDL: 10.6 mg/dL (ref 0.0–40.0)

## 2018-01-02 LAB — CBC
HEMATOCRIT: 38.5 % (ref 36.0–46.0)
HEMOGLOBIN: 12.8 g/dL (ref 12.0–15.0)
MCHC: 33.2 g/dL (ref 30.0–36.0)
MCV: 93.4 fl (ref 78.0–100.0)
PLATELETS: 203 10*3/uL (ref 150.0–400.0)
RBC: 4.12 Mil/uL (ref 3.87–5.11)
RDW: 14.1 % (ref 11.5–15.5)
WBC: 5.7 10*3/uL (ref 4.0–10.5)

## 2018-01-02 LAB — HEMOGLOBIN A1C: Hgb A1c MFr Bld: 5.8 % (ref 4.6–6.5)

## 2018-01-02 LAB — TSH: TSH: 1.44 u[IU]/mL (ref 0.35–4.50)

## 2018-01-02 LAB — URIC ACID: URIC ACID, SERUM: 6.2 mg/dL (ref 2.4–7.0)

## 2018-01-03 DIAGNOSIS — F4321 Adjustment disorder with depressed mood: Secondary | ICD-10-CM | POA: Insufficient documentation

## 2018-01-03 DIAGNOSIS — F988 Other specified behavioral and emotional disorders with onset usually occurring in childhood and adolescence: Secondary | ICD-10-CM | POA: Insufficient documentation

## 2018-01-03 NOTE — Assessment & Plan Note (Signed)
Patient encouraged to maintain heart healthy diet, regular exercise, adequate sleep. Consider daily probiotics. Take medications as prescribed 

## 2018-01-03 NOTE — Assessment & Plan Note (Signed)
Has undergone formal evaluation with [facility] to confirm diagnosis. Is started on Ritalin 5 mg po bid and possible side effects discussed. Follow up in 4 weeks

## 2018-01-03 NOTE — Assessment & Plan Note (Signed)
Encouraged heart healthy diet, increase exercise, avoid trans fats, consider a krill oil cap daily 

## 2018-01-03 NOTE — Assessment & Plan Note (Signed)
Lost her father in April of 2019 from a TBi he suffered in January she has begun counseling and is managing well most days. May consider meds in the future

## 2018-01-03 NOTE — Assessment & Plan Note (Signed)
hgba1c acceptable, minimize simple carbs. Increase exercise as tolerated. Continue current meds 

## 2018-01-05 ENCOUNTER — Telehealth: Payer: Self-pay

## 2018-01-05 NOTE — Telephone Encounter (Signed)
Copied from [place] [id number]. Topic: General - Other >> Jan 02, 2018  2:10 PM Adelene Idler wrote: Pt wants all of her labs from 2017 sent to her my chart account    I have released lab work from 2017

## 2018-01-22 ENCOUNTER — Telehealth: Payer: Self-pay | Admitting: Family Medicine

## 2018-01-22 ENCOUNTER — Encounter: Payer: Self-pay | Admitting: Family Medicine

## 2018-01-22 MED ORDER — VALACYCLOVIR HCL 1 G PO TABS: ORAL_TABLET | ORAL | 0 refills | Status: DC

## 2018-01-22 NOTE — Telephone Encounter (Signed)
Medication not in patients history of meds   Please advise

## 2018-01-22 NOTE — Telephone Encounter (Signed)
See my chart message

## 2018-01-22 NOTE — Telephone Encounter (Signed)
We have not prescribed the Valtrex before she must have received it elsewhere. I am willing to prescribe potentially but need to know more. How long has she been having lesions, how often. Where and what strength has she used if she knows then we can send in.

## 2018-01-22 NOTE — Telephone Encounter (Signed)
Via mychart patient says: I have a cold sore on the top lip on the left side. It formed yesterday. Have not had a break out in 8 years    Please advise

## 2018-01-22 NOTE — Telephone Encounter (Signed)
Noted thanks °

## 2018-01-22 NOTE — Telephone Encounter (Signed)
Copied from [place] [id number]. Topic: Quick Communication - Rx Refill/Question >> Jan 22, 2018  9:59 AM Bea Graff, NT wrote: Medication: Valtrex  Has the patient contacted their pharmacy? No. (Agent: If no, request that the patient contact the pharmacy for the refill.) (Agent: If yes, when and what did the pharmacy advise?) Pt has not had this medication filled from Dr. Charlett Blake before.  Preferred Pharmacy (with phone number or street name): [id number] - [place], [place] [phone] (Phone) [phone] (Fax)      Agent: Please be advised that RX refills may take up to 3 business days. We ask that you follow-up with your pharmacy.

## 2018-01-29 ENCOUNTER — Ambulatory Visit: Payer: Self-pay | Admitting: Family Medicine

## 2018-02-13 ENCOUNTER — Ambulatory Visit: Payer: BLUE CROSS/BLUE SHIELD | Admitting: Family Medicine

## 2018-02-13 VITALS — BP 110/66 | HR 83 | Temp 98.0°F | Resp 18 | Wt 117.2 lb

## 2018-02-13 DIAGNOSIS — F988 Other specified behavioral and emotional disorders with onset usually occurring in childhood and adolescence: Secondary | ICD-10-CM | POA: Diagnosis not present

## 2018-02-13 DIAGNOSIS — F4321 Adjustment disorder with depressed mood: Secondary | ICD-10-CM | POA: Diagnosis not present

## 2018-02-13 DIAGNOSIS — R229 Localized swelling, mass and lump, unspecified: Secondary | ICD-10-CM

## 2018-02-13 DIAGNOSIS — R739 Hyperglycemia, unspecified: Secondary | ICD-10-CM

## 2018-02-13 DIAGNOSIS — B001 Herpesviral vesicular dermatitis: Secondary | ICD-10-CM

## 2018-02-13 DIAGNOSIS — E785 Hyperlipidemia, unspecified: Secondary | ICD-10-CM

## 2018-02-13 MED ORDER — VALACYCLOVIR HCL 1 G PO TABS
ORAL_TABLET | ORAL | 2 refills | Status: DC
Start: 2018-02-13 — End: 2019-08-08

## 2018-02-13 MED ORDER — METHYLPHENIDATE HCL 10 MG PO TABS: 10.0000 mg | ORAL_TABLET | Freq: Two times a day (BID) | ORAL | 0 refills | Status: DC

## 2018-02-13 NOTE — Assessment & Plan Note (Signed)
Started Ritalin 5 mg po bid and increased to 10 mg po bid and she feels that has helped her concentrate more. She is interested in continuing meds.

## 2018-02-13 NOTE — Assessment & Plan Note (Addendum)
Struggling with the unexpected loss of he father in April still but doing a little better most days. Consider counseling or daily meds if symptoms persist or worsens

## 2018-02-13 NOTE — Progress Notes (Signed)
Subjective:    Patient ID: Sandra Long, female    DOB: 10-05-1975, 42 y.o.   MRN: [id number]  No chief complaint on file.   HPI Patient is in today for ADD follow up. Patient reports that she has seen improvement in her attention with Ritalin. Reports that she was initially taking 32m twice a day, but eventually increased to 138mtwice a day, and feels this has worked the best for her. She reports some loss of appetite, and has lost 4 lbs since starting the medication. However, she also changed her diet out of concern for her A1C from July. She also endorses some trouble sleeping, waking up in the middle of the night and feeling wide awake. She does not feel that either of these things are unmanageable.   She is also concerned about some mood changes she has noticed since her last visit, indicating that she has experienced some weeping and increased irritability. She wants to know if some of this could be due to weaning her youngest child from breast-feeding, and the associated hormonal changes. She is also still dealing with the sudden loss of her father in April.   Past Medical History:  Diagnosis Date  . Anemia 06/07/2016  . Cervical disc disease 11/25/2014   MRI 2014  1. Moderate left central and foraminal stenosis due to a large left lateral recess disc protrusion at C6-7, potentially with a small amount of marginal blood products - correlate with chronicity of onset of patient's symptoms. 2. There is also moderate right eccentric central stenosis at the C5-6 level due to a right paracentral disc protrusion. 3. Chronic ethmoid sinusitis. 4. Mild   . Dog bite 10/20/2016  . Dyslipidemia 06/07/2016  . Eye disease 12/07/2014   Macular rosacea  . Gestational diabetes 2009  . Hyperglycemia 06/07/2016  . Palpitations 11/25/2014  . Paresthesia 11/25/2014  . Preventative health care 12/07/2014    Past Surgical History:  Procedure Laterality Date  . CESAREAN SECTION  2007  . CESAREAN  SECTION N/A 09/08/2015   Procedure: Repeat CESAREAN SECTION;  Surgeon: RiBrien FewMD;  [facility];  Service: Obstetrics;  Laterality: N/A;  EDD: 09/14/15  . WISDOM TOOTH EXTRACTION      Family History  Problem Relation Age of Onset  . Heart disease Mother        2 stents 2015  . Tremor Mother        benign essential  . Heart disease Father        stents  . Diabetes Father        2079. Stroke Father   . Gout Father   . Blindness Father   . Gout Brother   . COPD Son   . Cancer Maternal Grandfather        Multiple Myeloma  . Stroke Paternal Grandmother   . Cancer Paternal Grandfather        bladder cancer    Social History   Socioeconomic History  . Marital status: Married    Spouse name: Not on file  . Number of children: Not on file  . Years of education: Not on file  . Highest education level: Not on file  Occupational History  . Occupation: RN [employer]  Social Needs  . Financial resource strain: Not on file  . Food insecurity:    Worry: Not on file    Inability: Not on file  . Transportation needs:    Medical:  Not on file    Non-medical: Not on file  Tobacco Use  . Smoking status: Never Smoker  . Smokeless tobacco: Never Used  Substance and Sexual Activity  . Alcohol use: Yes    Alcohol/week: 0.0 standard drinks  . Drug use: No  . Sexual activity: Yes    Comment: lives with husband, no red meat, works as Therapist, sports at [employer]  Lifestyle  . Physical activity:    Days per week: Not on file    Minutes per session: Not on file  . Stress: Not on file  Relationships  . Social connections:    Talks on phone: Not on file    Gets together: Not on file    Attends religious service: Not on file    Active member of club or organization: Not on file    Attends meetings of clubs or organizations: Not on file    Relationship status: Not on file  . Intimate partner violence:    Fear of current or ex partner: Not on file     Emotionally abused: Not on file    Physically abused: Not on file    Forced sexual activity: Not on file  Other Topics Concern  . Not on file  Social History Narrative  . Not on file    Outpatient Medications Prior to Visit  Medication Sig Dispense Refill  . ibuprofen (ADVIL,MOTRIN) 600 MG tablet Take 1 tablet (600 mg total) by mouth every 6 (six) hours. 30 tablet 0  . Magnesium 200 MG TABS Take 400 mg by mouth daily.    . Multiple Vitamins-Minerals (MULTIVITAMIN ADULT PO) Take 1 tablet by mouth daily.    . Olopatadine HCl 0.2 % SOLN Use one drop daily. 1 Bottle 0  . Vitamin D, Cholecalciferol, 1000 UNITS TABS Take 2,000 Units by mouth daily.    . methylphenidate (RITALIN) 5 MG tablet Take 1 tablet (5 mg total) by mouth 2 (two) times daily. 60 tablet 0  . valACYclovir (VALTREX) 1000 MG tablet Take 2 tablets by mouth now and repeat in 12 hours 4 tablet 0   No facility-administered medications prior to visit.     No Known Allergies  ROS Constitutional: Negative for chills, fever, malaise/fatigue and weight loss.  HENT: Negative for congestion and hearing loss.   Eyes: Negative for discharge.  Respiratory: Negative for cough, sputum production and shortness of breath.   Cardiovascular: Negative for chest pain, palpitations and leg swelling.  Gastrointestinal: Negative for abdominal pain, blood in stool, constipation, diarrhea, heartburn, nausea and vomiting.  Genitourinary: Negative for dysuria, frequency, hematuria and urgency.  Musculoskeletal: Negative for back pain, falls and myalgias.  Skin: Negative for rash.  Neurological: Negative for dizziness, sensory change, loss of consciousness, weakness and headaches.  Endo/Heme/Allergies: Negative for environmental allergies. Does not bruise/bleed easily.  Psychiatric/Behavioral: Positive for depression. Negative for suicidal ideas. The patient is nervous/anxious. Positive for insomnia.     Objective:    Physical  Exam Constitutional: She is oriented to person, place, and time. She appears well-developed and well-nourished. No distress.  HENT:  Head: Normocephalic and atraumatic.  Nose: Nose normal.  Eyes: Right eye exhibits no discharge. Left eye exhibits no discharge.  Neck: Normal range of motion. Neck supple.  Cardiovascular: Normal rate and regular rhythm.  No murmur heard. Pulmonary/Chest: Effort normal and breath sounds normal.  Abdominal: Soft. Bowel sounds are normal. There is no tenderness.  Musculoskeletal: She exhibits no edema.  Neurological: She is alert and oriented to person,  place, and time.  Skin: Skin is warm and dry.  Psychiatric: She has a normal mood and affect.   BP 110/66 (BP Location: Left Arm, Patient Position: Sitting, Cuff Size: Normal)   Pulse 83   Temp 98 F (36.7 C) (Oral)   Resp 18   Wt 117 lb 3.2 oz (53.2 kg)   SpO2 99%   BMI 20.44 kg/m  Wt Readings from Last 3 Encounters:  02/13/18 117 lb 3.2 oz (53.2 kg)  01/01/18 120 lb 12.8 oz (54.8 kg)  02/23/17 124 lb 2 oz (56.3 kg)     Lab Results  Component Value Date   WBC 5.7 01/01/2018   HGB 12.8 01/01/2018   HCT 38.5 01/01/2018   PLT 203.0 01/01/2018   GLUCOSE 89 01/01/2018   CHOL 147 01/01/2018   TRIG 53.0 01/01/2018   HDL 53.00 01/01/2018   LDLCALC 83 01/01/2018   ALT 19 01/01/2018   AST 23 01/01/2018   NA 141 01/01/2018   K 4.8 01/01/2018   CL 104 01/01/2018   CREATININE 0.54 01/01/2018   BUN 8 01/01/2018   CO2 31 01/01/2018   TSH 1.44 01/01/2018   HGBA1C 5.8 01/01/2018    Lab Results  Component Value Date   TSH 1.44 01/01/2018   Lab Results  Component Value Date   WBC 5.7 01/01/2018   HGB 12.8 01/01/2018   HCT 38.5 01/01/2018   MCV 93.4 01/01/2018   PLT 203.0 01/01/2018   Lab Results  Component Value Date   NA 141 01/01/2018   K 4.8 01/01/2018   CO2 31 01/01/2018   GLUCOSE 89 01/01/2018   BUN 8 01/01/2018   CREATININE 0.54 01/01/2018   BILITOT 0.6 01/01/2018   ALKPHOS  31 (L) 01/01/2018   AST 23 01/01/2018   ALT 19 01/01/2018   PROT 6.6 01/01/2018   ALBUMIN 4.3 01/01/2018   CALCIUM 9.2 01/01/2018   GFR 131.89 01/01/2018   Lab Results  Component Value Date   CHOL 147 01/01/2018   Lab Results  Component Value Date   HDL 53.00 01/01/2018   Lab Results  Component Value Date   LDLCALC 83 01/01/2018   Lab Results  Component Value Date   TRIG 53.0 01/01/2018   Lab Results  Component Value Date   CHOLHDL 3 01/01/2018   Lab Results  Component Value Date   HGBA1C 5.8 01/01/2018       Assessment & Plan:  Attention Deficit Disorder: Has had good results with Ritalin 14m bid, with manageable side effects. She will continue on that regimen, with the option to switch to the long-acting formulation in the future if insomnia continues. Follow up in 1 month.  Hyperglycemia: A1C on 7/11 was 5.8. Reassured her that this number was acceptable, but reinforced the need to minimize simple carbs and exercise on a regular basis. Recheck A1C in 2 months.  Stress/Anxiety: Counseled patient that she is still in the normal grief period after the loss of her father in April, and combined with the other stressors in her life, combined with the hormonal changes associated with weaning her child may be contributing to her increased irritability and weepiness. Will return for follow up in 1 month.   Subcutaneous mass: Stable since last visit, however patient is concerned about periodically aching. Abdominal soft tissue [place] evaluate.  Problem List Items Addressed This Visit      Other   Hyperglycemia    hgba1c acceptable, minimize simple carbs. Increase exercise as tolerated.  Attention deficit disorder    Started Ritalin 5 mg po bid and increased to 10 mg po bid and she feels that has helped her concentrate more. She is interested in continuing meds.       Relevant Medications   methylphenidate (RITALIN) 10 MG tablet   Feeling grief    Struggling  with       Other Visit Diagnoses    Subcutaneous mass    -  Primary   Relevant Orders   US Abdomen Complete      I have discontinued Evette Georges. Jeanpaul's methylphenidate. I am also having her start on methylphenidate. Additionally, I am having her maintain her Multiple Vitamins-Minerals (MULTIVITAMIN ADULT PO), Magnesium, Vitamin D (Cholecalciferol), ibuprofen, Olopatadine HCl, and valACYclovir.  Meds ordered this encounter  Medications  . valACYclovir (VALTREX) 1000 MG tablet    Sig: Take 2 tablets by mouth now and repeat in 12 hours    Dispense:  4 tablet    Refill:  2  . methylphenidate (RITALIN) 10 MG tablet    Sig: Take 1 tablet (10 mg total) by mouth 2 (two) times daily.    Dispense:  60 tablet    Refill:  [place], Medical Student   Patient seen with and examined with student.  Agree with documentation See separate note for further documentation

## 2018-02-13 NOTE — Patient Instructions (Signed)
Carbohydrate Counting Adult Carbohydrate counting is a method for keeping track of how many carbohydrates you eat. Eating carbohydrates naturally increases the amount of sugar (glucose) in the blood. Counting how many carbohydrates you eat helps keep your blood glucose within normal limits, which helps you manage your diabetes (diabetes mellitus). It is important to know how many carbohydrates you can safely have in each meal. This is different for every person. A diet and nutrition specialist (registered dietitian) can help you make a meal plan and calculate how many carbohydrates you should have at each meal and snack. Carbohydrates are found in the following foods:  Grains, such as breads and cereals.  Dried beans and soy products.  Starchy vegetables, such as potatoes, peas, and corn.  Fruit and fruit juices.  Milk and yogurt.  Sweets and snack foods, such as cake, cookies, candy, chips, and soft drinks.  How do I count carbohydrates? There are two ways to count carbohydrates in food. You can use either of the methods or a combination of both. Reading "Nutrition Facts" on packaged food The "Nutrition Facts" list is included on the labels of almost all packaged foods and beverages in the [place] It includes:  The serving size.  Information about nutrients in each serving, including the grams (g) of carbohydrate per serving.  To use the "Nutrition Facts":  Decide how many servings you will have.  Multiply the number of servings by the number of carbohydrates per serving.  The resulting number is the total amount of carbohydrates that you will be having.  Learning standard serving sizes of other foods When you eat foods containing carbohydrates that are not packaged or do not include "Nutrition Facts" on the label, you need to measure the servings in order to count the amount of carbohydrates:  Measure the foods that you will eat with a food scale or measuring cup, if  needed.  Decide how many standard-size servings you will eat.  Multiply the number of servings by 15. Most carbohydrate-rich foods have about 15 g of carbohydrates per serving. ? For example, if you eat 8 oz (170 g) of strawberries, you will have eaten 2 servings and 30 g of carbohydrates (2 servings x 15 g = 30 g).  For foods that have more than one food mixed, such as soups and casseroles, you must count the carbohydrates in each food that is included.  The following list contains standard serving sizes of common carbohydrate-rich foods. Each of these servings has about 15 g of carbohydrates:   hamburger bun or  English muffin.   oz (15 mL) syrup.   oz (14 g) jelly.  1 slice of bread.  1 six-inch tortilla.  3 oz (85 g) cooked rice or pasta.  4 oz (113 g) cooked dried beans.  4 oz (113 g) starchy vegetable, such as peas, corn, or potatoes.  4 oz (113 g) hot cereal.  4 oz (113 g) mashed potatoes or  of a large baked potato.  4 oz (113 g) canned or frozen fruit.  4 oz (120 mL) fruit juice.  4-6 crackers.  6 chicken nuggets.  6 oz (170 g) unsweetened dry cereal.  6 oz (170 g) plain fat-free yogurt or yogurt sweetened with artificial sweeteners.  8 oz (240 mL) milk.  8 oz (170 g) fresh fruit or one small piece of fruit.  24 oz (680 g) popped popcorn.  Example of carbohydrate counting Sample meal  3 oz (85 g) chicken breast.  6 oz (  170 g) brown rice.  4 oz (113 g) corn.  8 oz (240 mL) milk.  8 oz (170 g) strawberries with sugar-free whipped topping. Carbohydrate calculation 1. Identify the foods that contain carbohydrates: ? Rice. ? Corn. ? Milk. ? Strawberries. 2. Calculate how many servings you have of each food: ? 2 servings rice. ? 1 serving corn. ? 1 serving milk. ? 1 serving strawberries. 3. Multiply each number of servings by 15 g: ? 2 servings rice x 15 g = 30 g. ? 1 serving corn x 15 g = 15 g. ? 1 serving milk x 15 g = 15 g. ? 1  serving strawberries x 15 g = 15 g. 4. Add together all of the amounts to find the total grams of carbohydrates eaten: ? 30 g + 15 g + 15 g + 15 g = 75 g of carbohydrates total. This information is not intended to replace advice given to you by your health care provider. Make sure you discuss any questions you have with your health care provider. Document Released: 06/13/2005 Document Revised: 01/01/2016 Document Reviewed: 11/25/2015 Elsevier Interactive Patient Education  [employer].

## 2018-02-13 NOTE — Assessment & Plan Note (Signed)
hgba1c acceptable, minimize simple carbs. Increase exercise as tolerated.  

## 2018-02-18 DIAGNOSIS — B001 Herpesviral vesicular dermatitis: Secondary | ICD-10-CM | POA: Insufficient documentation

## 2018-02-18 NOTE — Assessment & Plan Note (Signed)
May continue Valtrex prn

## 2018-02-18 NOTE — Assessment & Plan Note (Signed)
Encouraged heart healthy diet, increase exercise, avoid trans fats, consider a krill oil cap daily 

## 2018-02-18 NOTE — Progress Notes (Signed)
Subjective:    Patient ID: Sandra Long, female    DOB: 1975-12-04, 42 y.o.   MRN: [id number]  No chief complaint on file.   HPI Patient is in today for follow up. She continues to struggle with sadness and grief after loosing her father in April but manages most days. No recent febrile illness or hospitalizations. Denies CP/palp/SOB/HA/congestion/fevers/GI or GU c/o. Taking meds as prescribed. She feels the Ritalin at 10 mg has helped more than the 5 mg dose. ent is in today for ADD follow up. Patient reports that she has seen improvement in her attention with Ritalin. Reports that she was initially taking 53m twice a day, but eventually increased to 189mtwice a day, and feels this has worked the best for her. She reports some loss of appetite, and has lost 4 lbs since starting the medication. However, she also changed her diet out of concern for her A1C from July. She also endorses some trouble sleeping, waking up in the middle of the night and feeling wide awake. She does not feel that either of these things are unmanageable.  She has had some tearfulness but is unclear if it is related to hormonal changes from cessation of breast feeding or loss of her Dad or both.   Past Medical History:  Diagnosis Date  . Anemia 06/07/2016  . Cervical disc disease 11/25/2014   MRI 2014  1. Moderate left central and foraminal stenosis due to a large left lateral recess disc protrusion at C6-7, potentially with a small amount of marginal blood products - correlate with chronicity of onset of patient's symptoms. 2. There is also moderate right eccentric central stenosis at the C5-6 level due to a right paracentral disc protrusion. 3. Chronic ethmoid sinusitis. 4. Mild   . Dog bite 10/20/2016  . Dyslipidemia 06/07/2016  . Eye disease 12/07/2014   Macular rosacea  . Gestational diabetes 2009  . Hyperglycemia 06/07/2016  . Palpitations 11/25/2014  . Paresthesia 11/25/2014  . Preventative health care  12/07/2014    Past Surgical History:  Procedure Laterality Date  . CESAREAN SECTION  2007  . CESAREAN SECTION N/A 09/08/2015   Procedure: Repeat CESAREAN SECTION;  Surgeon: RiBrien FewMD;  [facility];  Service: Obstetrics;  Laterality: N/A;  EDD: 09/14/15  . WISDOM TOOTH EXTRACTION      Family History  Problem Relation Age of Onset  . Heart disease Mother        2 stents 2015  . Tremor Mother        benign essential  . Heart disease Father        stents  . Diabetes Father        2047. Stroke Father   . Gout Father   . Blindness Father   . Gout Brother   . COPD Son   . Cancer Maternal Grandfather        Multiple Myeloma  . Stroke Paternal Grandmother   . Cancer Paternal Grandfather        bladder cancer    Social History   Socioeconomic History  . Marital status: Married    Spouse name: Not on file  . Number of children: Not on file  . Years of education: Not on file  . Highest education level: Not on file  Occupational History  . Occupation: RN [employer]  Social Needs  . Financial resource strain: Not on file  . Food insecurity:  Worry: Not on file    Inability: Not on file  . Transportation needs:    Medical: Not on file    Non-medical: Not on file  Tobacco Use  . Smoking status: Never Smoker  . Smokeless tobacco: Never Used  Substance and Sexual Activity  . Alcohol use: Yes    Alcohol/week: 0.0 standard drinks  . Drug use: No  . Sexual activity: Yes    Comment: lives with husband, no red meat, works as Therapist, sports at [employer]  Lifestyle  . Physical activity:    Days per week: Not on file    Minutes per session: Not on file  . Stress: Not on file  Relationships  . Social connections:    Talks on phone: Not on file    Gets together: Not on file    Attends religious service: Not on file    Active member of club or organization: Not on file    Attends meetings of clubs or organizations: Not on file     Relationship status: Not on file  . Intimate partner violence:    Fear of current or ex partner: Not on file    Emotionally abused: Not on file    Physically abused: Not on file    Forced sexual activity: Not on file  Other Topics Concern  . Not on file  Social History Narrative  . Not on file    Outpatient Medications Prior to Visit  Medication Sig Dispense Refill  . ibuprofen (ADVIL,MOTRIN) 600 MG tablet Take 1 tablet (600 mg total) by mouth every 6 (six) hours. 30 tablet 0  . Magnesium 200 MG TABS Take 400 mg by mouth daily.    . Multiple Vitamins-Minerals (MULTIVITAMIN ADULT PO) Take 1 tablet by mouth daily.    . Olopatadine HCl 0.2 % SOLN Use one drop daily. 1 Bottle 0  . Vitamin D, Cholecalciferol, 1000 UNITS TABS Take 2,000 Units by mouth daily.    . methylphenidate (RITALIN) 5 MG tablet Take 1 tablet (5 mg total) by mouth 2 (two) times daily. 60 tablet 0  . valACYclovir (VALTREX) 1000 MG tablet Take 2 tablets by mouth now and repeat in 12 hours 4 tablet 0   No facility-administered medications prior to visit.     No Known Allergies  Review of Systems  Constitutional: Negative for fever and malaise/fatigue.  HENT: Negative for congestion.   Eyes: Negative for blurred vision.  Respiratory: Negative for shortness of breath.   Cardiovascular: Negative for chest pain, palpitations and leg swelling.  Gastrointestinal: Negative for abdominal pain, blood in stool and nausea.  Genitourinary: Negative for dysuria and frequency.  Musculoskeletal: Negative for falls.  Skin: Negative for rash.  Neurological: Negative for dizziness, loss of consciousness and headaches.  Endo/Heme/Allergies: Negative for environmental allergies.  Psychiatric/Behavioral: Negative for depression. The patient is not nervous/anxious.        Objective:    Physical Exam  BP 110/66 (BP Location: Left Arm, Patient Position: Sitting, Cuff Size: Normal)   Pulse 83   Temp 98 F (36.7 C) (Oral)   Resp  18   Wt 117 lb 3.2 oz (53.2 kg)   SpO2 99%   BMI 20.44 kg/m  Wt Readings from Last 3 Encounters:  02/13/18 117 lb 3.2 oz (53.2 kg)  01/01/18 120 lb 12.8 oz (54.8 kg)  02/23/17 124 lb 2 oz (56.3 kg)     Lab Results  Component Value Date   WBC 5.7 01/01/2018   HGB 12.8 01/01/2018  HCT 38.5 01/01/2018   PLT 203.0 01/01/2018   GLUCOSE 89 01/01/2018   CHOL 147 01/01/2018   TRIG 53.0 01/01/2018   HDL 53.00 01/01/2018   LDLCALC 83 01/01/2018   ALT 19 01/01/2018   AST 23 01/01/2018   NA 141 01/01/2018   K 4.8 01/01/2018   CL 104 01/01/2018   CREATININE 0.54 01/01/2018   BUN 8 01/01/2018   CO2 31 01/01/2018   TSH 1.44 01/01/2018   HGBA1C 5.8 01/01/2018    Lab Results  Component Value Date   TSH 1.44 01/01/2018   Lab Results  Component Value Date   WBC 5.7 01/01/2018   HGB 12.8 01/01/2018   HCT 38.5 01/01/2018   MCV 93.4 01/01/2018   PLT 203.0 01/01/2018   Lab Results  Component Value Date   NA 141 01/01/2018   K 4.8 01/01/2018   CO2 31 01/01/2018   GLUCOSE 89 01/01/2018   BUN 8 01/01/2018   CREATININE 0.54 01/01/2018   BILITOT 0.6 01/01/2018   ALKPHOS 31 (L) 01/01/2018   AST 23 01/01/2018   ALT 19 01/01/2018   PROT 6.6 01/01/2018   ALBUMIN 4.3 01/01/2018   CALCIUM 9.2 01/01/2018   GFR 131.89 01/01/2018   Lab Results  Component Value Date   CHOL 147 01/01/2018   Lab Results  Component Value Date   HDL 53.00 01/01/2018   Lab Results  Component Value Date   LDLCALC 83 01/01/2018   Lab Results  Component Value Date   TRIG 53.0 01/01/2018   Lab Results  Component Value Date   CHOLHDL 3 01/01/2018   Lab Results  Component Value Date   HGBA1C 5.8 01/01/2018       Assessment & Plan:   Problem List Items Addressed This Visit    Hyperglycemia    hgba1c acceptable, minimize simple carbs. Increase exercise as tolerated.       Dyslipidemia    Encouraged heart healthy diet, increase exercise, avoid trans fats, consider a krill oil cap  daily      Attention deficit disorder    Started Ritalin 5 mg po bid and increased to 10 mg po bid and she feels that has helped her concentrate more. She is interested in continuing meds.       Relevant Medications   methylphenidate (RITALIN) 10 MG tablet   Feeling grief    Struggling with the unexpected loss of he father in April still but doing a little better most days. Consider counseling or daily meds if symptoms persist or worsens      Cold sore    May continue Valtrex prn      Relevant Medications   valACYclovir (VALTREX) 1000 MG tablet    Other Visit Diagnoses    Subcutaneous mass    -  Primary   Relevant Orders   US Abdomen Complete      I have discontinued Evette Georges. Bonnin's methylphenidate. I am also having her start on methylphenidate. Additionally, I am having her maintain her Multiple Vitamins-Minerals (MULTIVITAMIN ADULT PO), Magnesium, Vitamin D (Cholecalciferol), ibuprofen, Olopatadine HCl, and valACYclovir.  Meds ordered this encounter  Medications  . valACYclovir (VALTREX) 1000 MG tablet    Sig: Take 2 tablets by mouth now and repeat in 12 hours    Dispense:  4 tablet    Refill:  2  . methylphenidate (RITALIN) 10 MG tablet    Sig: Take 1 tablet (10 mg total) by mouth 2 (two) times daily.    Dispense:  60 tablet    Refill:  0     Penni Homans, MD

## 2018-02-28 ENCOUNTER — Ambulatory Visit (HOSPITAL_BASED_OUTPATIENT_CLINIC_OR_DEPARTMENT_OTHER)
Admission: RE | Admit: 2018-02-28 | Discharge: 2018-02-28 | Disposition: A | Discharge status: Home / Self Care | Payer: BLUE CROSS/BLUE SHIELD | Source: Ambulatory Visit | Attending: Family Medicine | Admitting: Family Medicine

## 2018-02-28 DIAGNOSIS — R1902 Left upper quadrant abdominal swelling, mass and lump: Secondary | ICD-10-CM | POA: Diagnosis not present

## 2018-02-28 DIAGNOSIS — R229 Localized swelling, mass and lump, unspecified: Secondary | ICD-10-CM

## 2018-03-12 ENCOUNTER — Ambulatory Visit: Payer: BLUE CROSS/BLUE SHIELD | Admitting: Family Medicine

## 2018-03-12 DIAGNOSIS — F4321 Adjustment disorder with depressed mood: Secondary | ICD-10-CM

## 2018-03-12 DIAGNOSIS — F988 Other specified behavioral and emotional disorders with onset usually occurring in childhood and adolescence: Secondary | ICD-10-CM | POA: Diagnosis not present

## 2018-03-12 MED ORDER — AMPHETAMINE-DEXTROAMPHET ER 20 MG PO CP24: 20.0000 mg | ORAL_CAPSULE | ORAL | 0 refills | Status: DC

## 2018-03-12 NOTE — Assessment & Plan Note (Signed)
Feeling much better with counseling and doe not feel she needs further treatment at this time

## 2018-03-12 NOTE — Patient Instructions (Signed)

## 2018-03-12 NOTE — Progress Notes (Signed)
Subjective:  I acted as a Education administrator for Dr. Charlett Blake. [place], [place]  Patient ID: Sandra Long, female    DOB: [birth year], 42 y.o.   MRN: [id number]  No chief complaint on file.   HPI  Patient is in today for 4 week follow up. She reports her grief after her father's death is getting a little better. Continues to counseling. No trouble with Ritalin but only lasts 2-3 hours. Denies CP/palp/SOB/HA/congestion/fevers/GI or GU c/o. Taking meds as prescribed  Patient Care Team: Mosie Lukes, MD as PCP - General (Family Medicine) Brien Few, MD as Consulting Physician (Obstetrics and Gynecology)   Past Medical History:  Diagnosis Date  . Anemia 06/07/2016  . Cervical disc disease 11/25/2014   MRI 2014  1. Moderate left central and foraminal stenosis due to a large left lateral recess disc protrusion at C6-7, potentially with a small amount of marginal blood products - correlate with chronicity of onset of patient's symptoms. 2. There is also moderate right eccentric central stenosis at the C5-6 level due to a right paracentral disc protrusion. 3. Chronic ethmoid sinusitis. 4. Mild   . Dog bite 10/20/2016  . Dyslipidemia 06/07/2016  . Eye disease 12/07/2014   Macular rosacea  . Gestational diabetes 2009  . Hyperglycemia 06/07/2016  . Palpitations 11/25/2014  . Paresthesia 11/25/2014  . Preventative health care 12/07/2014    Past Surgical History:  Procedure Laterality Date  . CESAREAN SECTION  2007  . CESAREAN SECTION N/A 09/08/2015   Procedure: Repeat CESAREAN SECTION;  Surgeon: Brien Few, MD;  Location: [facility];  Service: Obstetrics;  Laterality: N/A;  EDD: 09/14/15  . WISDOM TOOTH EXTRACTION      Family History  Problem Relation Age of Onset  . Heart disease Mother        2 stents 2015  . Tremor Mother        benign essential  . Heart disease Father        stents  . Diabetes Father        86  . Stroke Father   . Gout Father   . Blindness Father   . Gout Brother     . COPD Son   . Cancer Maternal Grandfather        Multiple Myeloma  . Stroke Paternal Grandmother   . Cancer Paternal Grandfather        bladder cancer    Social History   Socioeconomic History  . Marital status: Married    Spouse name: Not on file  . Number of children: Not on file  . Years of education: Not on file  . Highest education level: Not on file  Occupational History  . Occupation: RN [employer]  Social Needs  . Financial resource strain: Not on file  . Food insecurity:    Worry: Not on file    Inability: Not on file  . Transportation needs:    Medical: Not on file    Non-medical: Not on file  Tobacco Use  . Smoking status: Never Smoker  . Smokeless tobacco: Never Used  Substance and Sexual Activity  . Alcohol use: Yes    Alcohol/week: 0.0 standard drinks  . Drug use: No  . Sexual activity: Yes    Comment: lives with husband, no red meat, works as Therapist, sports at [employer]  Lifestyle  . Physical activity:    Days per week: Not on file    Minutes per session: Not on file  .  Stress: Not on file  Relationships  . Social connections:    Talks on phone: Not on file    Gets together: Not on file    Attends religious service: Not on file    Active member of club or organization: Not on file    Attends meetings of clubs or organizations: Not on file    Relationship status: Not on file  . Intimate partner violence:    Fear of current or ex partner: Not on file    Emotionally abused: Not on file    Physically abused: Not on file    Forced sexual activity: Not on file  Other Topics Concern  . Not on file  Social History Narrative  . Not on file    Outpatient Medications Prior to Visit  Medication Sig Dispense Refill  . ibuprofen (ADVIL,MOTRIN) 600 MG tablet Take 1 tablet (600 mg total) by mouth every 6 (six) hours. 30 tablet 0  . Magnesium 200 MG TABS Take 400 mg by mouth daily.    . Multiple Vitamins-Minerals (MULTIVITAMIN ADULT  PO) Take 1 tablet by mouth daily.    . Olopatadine HCl 0.2 % SOLN Use one drop daily. 1 Bottle 0  . valACYclovir (VALTREX) 1000 MG tablet Take 2 tablets by mouth now and repeat in 12 hours 4 tablet 2  . Vitamin D, Cholecalciferol, 1000 UNITS TABS Take 2,000 Units by mouth daily.    . methylphenidate (RITALIN) 10 MG tablet Take 1 tablet (10 mg total) by mouth 2 (two) times daily. 60 tablet 0   No facility-administered medications prior to visit.     No Known Allergies  Review of Systems  Constitutional: Negative for fever and malaise/fatigue.  HENT: Negative for congestion.   Eyes: Negative for blurred vision.  Respiratory: Negative for shortness of breath.   Cardiovascular: Negative for chest pain, palpitations and leg swelling.  Gastrointestinal: Negative for abdominal pain, blood in stool and nausea.  Genitourinary: Negative for dysuria and frequency.  Musculoskeletal: Negative for falls.  Skin: Negative for rash.  Neurological: Negative for dizziness, loss of consciousness and headaches.  Endo/Heme/Allergies: Negative for environmental allergies.  Psychiatric/Behavioral: Negative for depression. The patient is not nervous/anxious.        Objective:    Physical Exam  Constitutional: She is oriented to person, place, and time. She appears well-developed and well-nourished. No distress.  HENT:  Head: Normocephalic and atraumatic.  Nose: Nose normal.  Eyes: Right eye exhibits no discharge. Left eye exhibits no discharge.  Neck: Normal range of motion. Neck supple.  Cardiovascular: Normal rate and regular rhythm.  No murmur heard. Pulmonary/Chest: Effort normal and breath sounds normal.  Abdominal: Soft. Bowel sounds are normal. There is no tenderness.  Musculoskeletal: She exhibits no edema.  Neurological: She is alert and oriented to person, place, and time.  Skin: Skin is warm and dry.  Psychiatric: She has a normal mood and affect.  Nursing note and vitals  reviewed.   BP 98/60 (BP Location: Left Arm, Patient Position: Sitting, Cuff Size: Normal)   Pulse 60   Temp 98.3 F (36.8 C) (Oral)   Resp 18   Ht _0  (1.6 m)   Wt 115 lb 12.8 oz (52.5 kg)   SpO2 99%   BMI 20.51 kg/m  Wt Readings from Last 3 Encounters:  03/12/18 115 lb 12.8 oz (52.5 kg)  02/13/18 117 lb 3.2 oz (53.2 kg)  01/01/18 120 lb 12.8 oz (54.8 kg)   BP Readings from Last 3  Encounters:  03/12/18 98/60  02/13/18 110/66  01/01/18 98/62     Immunization History  Administered Date(s) Administered  . Influenza-Unspecified 03/27/2014, 03/27/2016  . Pneumococcal Polysaccharide-23 06/28/2007  . Td 06/28/2007  . Tdap 10/26/2010, 06/18/2012, 06/28/2015    Health Maintenance  Topic Date Due  . PAP SMEAR  07/29/2011  . INFLUENZA VACCINE  01/25/2018  . TETANUS/TDAP  06/27/2025  . HIV Screening  Completed    Lab Results  Component Value Date   WBC 5.7 01/01/2018   HGB 12.8 01/01/2018   HCT 38.5 01/01/2018   PLT 203.0 01/01/2018   GLUCOSE 89 01/01/2018   CHOL 147 01/01/2018   TRIG 53.0 01/01/2018   HDL 53.00 01/01/2018   LDLCALC 83 01/01/2018   ALT 19 01/01/2018   AST 23 01/01/2018   NA 141 01/01/2018   K 4.8 01/01/2018   CL 104 01/01/2018   CREATININE 0.54 01/01/2018   BUN 8 01/01/2018   CO2 31 01/01/2018   TSH 1.44 01/01/2018   HGBA1C 5.8 01/01/2018    Lab Results  Component Value Date   TSH 1.44 01/01/2018   Lab Results  Component Value Date   WBC 5.7 01/01/2018   HGB 12.8 01/01/2018   HCT 38.5 01/01/2018   MCV 93.4 01/01/2018   PLT 203.0 01/01/2018   Lab Results  Component Value Date   NA 141 01/01/2018   K 4.8 01/01/2018   CO2 31 01/01/2018   GLUCOSE 89 01/01/2018   BUN 8 01/01/2018   CREATININE 0.54 01/01/2018   BILITOT 0.6 01/01/2018   ALKPHOS 31 (L) 01/01/2018   AST 23 01/01/2018   ALT 19 01/01/2018   PROT 6.6 01/01/2018   ALBUMIN 4.3 01/01/2018   CALCIUM 9.2 01/01/2018   GFR 131.89 01/01/2018   Lab Results  Component  Value Date   CHOL 147 01/01/2018   Lab Results  Component Value Date   HDL 53.00 01/01/2018   Lab Results  Component Value Date   LDLCALC 83 01/01/2018   Lab Results  Component Value Date   TRIG 53.0 01/01/2018   Lab Results  Component Value Date   CHOLHDL 3 01/01/2018   Lab Results  Component Value Date   HGBA1C 5.8 01/01/2018         Assessment & Plan:   Problem List Items Addressed This Visit    Attention deficit disorder    Ritalin wears off too fast but no concerning side effects. Will try Switch to Adderal extended release 20 and see her back in 4 weeks      Feeling grief    Feeling much better with counseling and doe not feel she needs further treatment at this time         I have discontinued Evette Georges. [place]berger's methylphenidate. I am also having her start on amphetamine-dextroamphetamine. Additionally, I am having her maintain her Multiple Vitamins-Minerals (MULTIVITAMIN ADULT PO), Magnesium, Vitamin D (Cholecalciferol), ibuprofen, Olopatadine HCl, and valACYclovir.  Meds ordered this encounter  Medications  . amphetamine-dextroamphetamine (ADDERALL XR) 20 MG 24 hr capsule    Sig: Take 1 capsule (20 mg total) by mouth every morning.    Dispense:  30 capsule    Refill:  0    CMA served as scribe during this visit. History, Physical and Plan performed by medical provider. Documentation and orders reviewed and attested to.  Penni Homans, MD

## 2018-03-12 NOTE — Assessment & Plan Note (Signed)
Ritalin wears off too fast but no concerning side effects. Will try Switch to Adderal extended release 20 and see her back in 4 weeks

## 2018-04-19 ENCOUNTER — Ambulatory Visit: Payer: BLUE CROSS/BLUE SHIELD | Admitting: Family Medicine

## 2018-04-19 VITALS — Wt 114.6 lb

## 2018-04-19 DIAGNOSIS — R002 Palpitations: Secondary | ICD-10-CM

## 2018-04-19 DIAGNOSIS — F988 Other specified behavioral and emotional disorders with onset usually occurring in childhood and adolescence: Secondary | ICD-10-CM

## 2018-04-19 DIAGNOSIS — M25521 Pain in right elbow: Secondary | ICD-10-CM | POA: Diagnosis not present

## 2018-04-19 MED ORDER — AMPHETAMINE-DEXTROAMPHET ER 20 MG PO CP24: 20.0000 mg | ORAL_CAPSULE | Freq: Every day | ORAL | 0 refills | Status: DC

## 2018-04-19 MED ORDER — AMPHETAMINE-DEXTROAMPHET ER 20 MG PO CP24: 20.0000 mg | ORAL_CAPSULE | ORAL | 0 refills | Status: DC

## 2018-04-19 NOTE — Patient Instructions (Signed)
Tennis Elbow Tennis elbow is puffiness (inflammation) of the outer tendons of your forearm close to your elbow. Your tendons attach your muscles to your bones. Tennis elbow can happen in any sport or job in which you use your elbow too much. It is caused by doing the same motion over and over. Tennis elbow can cause:  Pain and tenderness in your forearm and the outer part of your elbow.  A burning feeling. This runs from your elbow through your arm.  Weak grip in your hands.  Follow these instructions at home: Activity  Rest your elbow and wrist as told by your doctor. Try to avoid any activities that caused the problem until your doctor says that you can do them again.  If a physical therapist teaches you exercises, do all of them as told.  If you lift an object, lift it with your palm facing up. This is easier on your elbow. Lifestyle  If your tennis elbow is caused by sports, check your equipment and make sure that: ? You are using it correctly. ? It fits you well.  If your tennis elbow is caused by work, take breaks often, if you are able. Talk with your manager about doing your work in a way that is safe for you. ? If your tennis elbow is caused by computer use, talk with your manager about any changes that can be made to your work setup. General instructions  If told, apply ice to the painful area: ? Put ice in a plastic bag. ? Place a towel between your skin and the bag. ? Leave the ice on for 20 minutes, 2-3 times per day.  Take medicines only as told by your doctor.  If you were given a brace, wear it as told by your doctor.  Keep all follow-up visits as told by your doctor. This is important. Contact a doctor if:  Your pain does not get better with treatment.  Your pain gets worse.  You have weakness in your forearm, hand, or fingers.  You cannot feel your forearm, hand, or fingers. This information is not intended to replace advice given to you by your health  care provider. Make sure you discuss any questions you have with your health care provider. Document Released: 12/01/2009 Document Revised: 02/11/2016 Document Reviewed: 06/09/2014 Elsevier Interactive Patient Education  2018 Elsevier Inc.  

## 2018-04-22 NOTE — Assessment & Plan Note (Signed)
No c/o at visit

## 2018-04-22 NOTE — Progress Notes (Signed)
Subjective:    Patient ID: Sandra Long, female    DOB: 03/17/1976, 42 y.o.   MRN: [id number]  No chief complaint on file.   HPI Patient is in today for follow-up.  She feels the switch to the Adderall XR 20 mg has been helpful.  She has had no concerning side effects such as worsening palpitations, anxiety or insomnia.  No recent febrile illness or acute concerns noted.  She does note her right elbow pain which had improved with a steroid shot has returned and she is interested in a course of physical therapy at this time. No CP/palp/SOB/HA/congestion/fevers/GI or GU c/o. Taking meds as prescribed  Past Medical History:  Diagnosis Date  . Anemia 06/07/2016  . Cervical disc disease 11/25/2014   MRI 2014  1. Moderate left central and foraminal stenosis due to a large left lateral recess disc protrusion at C6-7, potentially with a small amount of marginal blood products - correlate with chronicity of onset of patient's symptoms. 2. There is also moderate right eccentric central stenosis at the C5-6 level due to a right paracentral disc protrusion. 3. Chronic ethmoid sinusitis. 4. Mild   . Dog bite 10/20/2016  . Dyslipidemia 06/07/2016  . Eye disease 12/07/2014   Macular rosacea  . Gestational diabetes 2009  . Hyperglycemia 06/07/2016  . Palpitations 11/25/2014  . Paresthesia 11/25/2014  . Preventative health care 12/07/2014    Past Surgical History:  Procedure Laterality Date  . CESAREAN SECTION  2007  . CESAREAN SECTION N/A 09/08/2015   Procedure: Repeat CESAREAN SECTION;  Surgeon: Brien Few, MD;  Location: [facility];  Service: Obstetrics;  Laterality: N/A;  EDD: 09/14/15  . WISDOM TOOTH EXTRACTION      Family History  Problem Relation Age of Onset  . Heart disease Mother        2 stents 2015  . Tremor Mother        benign essential  . Heart disease Father        stents  . Diabetes Father        44  . Stroke Father   . Gout Father   . Blindness Father   . Gout  Brother   . COPD Son   . Cancer Maternal Grandfather        Multiple Myeloma  . Stroke Paternal Grandmother   . Cancer Paternal Grandfather        bladder cancer    Social History   Socioeconomic History  . Marital status: Married    Spouse name: Not on file  . Number of children: Not on file  . Years of education: Not on file  . Highest education level: Not on file  Occupational History  . Occupation: RN [employer]  Social Needs  . Financial resource strain: Not on file  . Food insecurity:    Worry: Not on file    Inability: Not on file  . Transportation needs:    Medical: Not on file    Non-medical: Not on file  Tobacco Use  . Smoking status: Never Smoker  . Smokeless tobacco: Never Used  Substance and Sexual Activity  . Alcohol use: Yes    Alcohol/week: 0.0 standard drinks  . Drug use: No  . Sexual activity: Yes    Comment: lives with husband, no red meat, works as Therapist, sports at [employer]  Lifestyle  . Physical activity:    Days per week: Not on file    Minutes per  session: Not on file  . Stress: Not on file  Relationships  . Social connections:    Talks on phone: Not on file    Gets together: Not on file    Attends religious service: Not on file    Active member of club or organization: Not on file    Attends meetings of clubs or organizations: Not on file    Relationship status: Not on file  . Intimate partner violence:    Fear of current or ex partner: Not on file    Emotionally abused: Not on file    Physically abused: Not on file    Forced sexual activity: Not on file  Other Topics Concern  . Not on file  Social History Narrative  . Not on file    Outpatient Medications Prior to Visit  Medication Sig Dispense Refill  . ibuprofen (ADVIL,MOTRIN) 600 MG tablet Take 1 tablet (600 mg total) by mouth every 6 (six) hours. 30 tablet 0  . Magnesium 200 MG TABS Take 400 mg by mouth daily.    . Multiple Vitamins-Minerals  (MULTIVITAMIN ADULT PO) Take 1 tablet by mouth daily.    . Olopatadine HCl 0.2 % SOLN Use one drop daily. 1 Bottle 0  . valACYclovir (VALTREX) 1000 MG tablet Take 2 tablets by mouth now and repeat in 12 hours 4 tablet 2  . Vitamin D, Cholecalciferol, 1000 UNITS TABS Take 2,000 Units by mouth daily.    Marland Kitchen amphetamine-dextroamphetamine (ADDERALL XR) 20 MG 24 hr capsule Take 1 capsule (20 mg total) by mouth every morning. 30 capsule 0   No facility-administered medications prior to visit.     No Known Allergies  Review of Systems  Constitutional: Negative for fever and malaise/fatigue.  HENT: Negative for congestion.   Eyes: Negative for blurred vision.  Respiratory: Negative for shortness of breath.   Cardiovascular: Negative for chest pain, palpitations and leg swelling.  Gastrointestinal: Negative for abdominal pain, blood in stool and nausea.  Genitourinary: Negative for dysuria and frequency.  Musculoskeletal: Positive for joint pain. Negative for falls.  Skin: Negative for rash.  Neurological: Negative for dizziness, loss of consciousness and headaches.  Endo/Heme/Allergies: Negative for environmental allergies.  Psychiatric/Behavioral: Negative for depression. The patient is not nervous/anxious.        Objective:    Physical Exam  Constitutional: She is oriented to person, place, and time. She appears well-developed and well-nourished. No distress.  HENT:  Head: Normocephalic and atraumatic.  Nose: Nose normal.  Eyes: Right eye exhibits no discharge. Left eye exhibits no discharge.  Neck: Normal range of motion. Neck supple.  Cardiovascular: Normal rate and regular rhythm.  No murmur heard. Pulmonary/Chest: Effort normal and breath sounds normal.  Abdominal: Soft. Bowel sounds are normal. There is no tenderness.  Musculoskeletal: She exhibits no edema.  Neurological: She is alert and oriented to person, place, and time.  Skin: Skin is warm and dry.  Psychiatric: She has  a normal mood and affect.  Nursing note and vitals reviewed.   Wt 114 lb 9.6 oz (52 kg)   BMI 20.30 kg/m  Wt Readings from Last 3 Encounters:  04/19/18 114 lb 9.6 oz (52 kg)  03/12/18 115 lb 12.8 oz (52.5 kg)  02/13/18 117 lb 3.2 oz (53.2 kg)     Lab Results  Component Value Date   WBC 5.7 01/01/2018   HGB 12.8 01/01/2018   HCT 38.5 01/01/2018   PLT 203.0 01/01/2018   GLUCOSE 89 01/01/2018  CHOL 147 01/01/2018   TRIG 53.0 01/01/2018   HDL 53.00 01/01/2018   LDLCALC 83 01/01/2018   ALT 19 01/01/2018   AST 23 01/01/2018   NA 141 01/01/2018   K 4.8 01/01/2018   CL 104 01/01/2018   CREATININE 0.54 01/01/2018   BUN 8 01/01/2018   CO2 31 01/01/2018   TSH 1.44 01/01/2018   HGBA1C 5.8 01/01/2018    Lab Results  Component Value Date   TSH 1.44 01/01/2018   Lab Results  Component Value Date   WBC 5.7 01/01/2018   HGB 12.8 01/01/2018   HCT 38.5 01/01/2018   MCV 93.4 01/01/2018   PLT 203.0 01/01/2018   Lab Results  Component Value Date   NA 141 01/01/2018   K 4.8 01/01/2018   CO2 31 01/01/2018   GLUCOSE 89 01/01/2018   BUN 8 01/01/2018   CREATININE 0.54 01/01/2018   BILITOT 0.6 01/01/2018   ALKPHOS 31 (L) 01/01/2018   AST 23 01/01/2018   ALT 19 01/01/2018   PROT 6.6 01/01/2018   ALBUMIN 4.3 01/01/2018   CALCIUM 9.2 01/01/2018   GFR 131.89 01/01/2018   Lab Results  Component Value Date   CHOL 147 01/01/2018   Lab Results  Component Value Date   HDL 53.00 01/01/2018   Lab Results  Component Value Date   LDLCALC 83 01/01/2018   Lab Results  Component Value Date   TRIG 53.0 01/01/2018   Lab Results  Component Value Date   CHOLHDL 3 01/01/2018   Lab Results  Component Value Date   HGBA1C 5.8 01/01/2018       Assessment & Plan:   Problem List Items Addressed This Visit    Palpitations    No c/o at visit      Pain in right elbow    Was improved after a steroid shot so she never went to PT but it now hurts again so she agrees to PT now.  Is referred.       Attention deficit disorder    Her response to the Adderall XR 20 mg has been adequate. No changes for now.        Other Visit Diagnoses    Elbow pain, right    -  Primary   Relevant Orders   Ambulatory referral to Physical Therapy      I have changed Evette Georges. Flannagan's amphetamine-dextroamphetamine. I am also having her start on amphetamine-dextroamphetamine and amphetamine-dextroamphetamine. Additionally, I am having her maintain her Multiple Vitamins-Minerals (MULTIVITAMIN ADULT PO), Magnesium, Vitamin D (Cholecalciferol), ibuprofen, Olopatadine HCl, and valACYclovir.  Meds ordered this encounter  Medications  . amphetamine-dextroamphetamine (ADDERALL XR) 20 MG 24 hr capsule    Sig: Take 1 capsule (20 mg total) by mouth every morning. December 2019    Dispense:  30 capsule    Refill:  0  . amphetamine-dextroamphetamine (ADDERALL XR) 20 MG 24 hr capsule    Sig: Take 1 capsule (20 mg total) by mouth daily. November 2019    Dispense:  30 capsule    Refill:  0  . amphetamine-dextroamphetamine (ADDERALL XR) 20 MG 24 hr capsule    Sig: Take 1 capsule (20 mg total) by mouth daily. October 2019    Dispense:  30 capsule    Refill:  0     Penni Homans, MD

## 2018-04-22 NOTE — Assessment & Plan Note (Signed)
Her response to the Adderall XR 20 mg has been adequate. No changes for now.

## 2018-04-22 NOTE — Assessment & Plan Note (Signed)
Was improved after a steroid shot so she never went to PT but it now hurts again so she agrees to PT now. Is referred.

## 2018-04-27 DIAGNOSIS — M25521 Pain in right elbow: Secondary | ICD-10-CM | POA: Diagnosis not present

## 2018-04-30 ENCOUNTER — Telehealth: Payer: Self-pay | Admitting: *Deleted

## 2018-04-30 DIAGNOSIS — M25521 Pain in right elbow: Secondary | ICD-10-CM | POA: Diagnosis not present

## 2018-04-30 NOTE — Telephone Encounter (Signed)
Received Physician Orders from [place]; forwarded to provider/SLS 11/04

## 2018-05-07 DIAGNOSIS — M25521 Pain in right elbow: Secondary | ICD-10-CM | POA: Diagnosis not present

## 2018-05-08 ENCOUNTER — Telehealth: Payer: Self-pay | Admitting: *Deleted

## 2018-05-08 NOTE — Telephone Encounter (Signed)
Received Authorization Confirmation for PT Evaluation via [employer] ; forwarded to provider/SLS 11/12

## 2018-05-11 DIAGNOSIS — M25521 Pain in right elbow: Secondary | ICD-10-CM | POA: Diagnosis not present

## 2018-05-14 DIAGNOSIS — M25521 Pain in right elbow: Secondary | ICD-10-CM | POA: Diagnosis not present

## 2018-05-18 DIAGNOSIS — M25521 Pain in right elbow: Secondary | ICD-10-CM | POA: Diagnosis not present

## 2018-05-21 DIAGNOSIS — M25521 Pain in right elbow: Secondary | ICD-10-CM | POA: Diagnosis not present

## 2018-07-23 ENCOUNTER — Other Ambulatory Visit: Payer: Self-pay | Admitting: Family Medicine

## 2018-07-23 MED ORDER — AMPHETAMINE-DEXTROAMPHET ER 20 MG PO CP24: 20.0000 mg | ORAL_CAPSULE | Freq: Every day | ORAL | 0 refills | Status: DC

## 2018-07-23 NOTE — Telephone Encounter (Signed)
Copied from [place] [phone]. Topic: Quick Communication - Rx Refill/Question >> Jul 23, 2018  2:09 PM Blase Mess A wrote: Medication: amphetamine-dextroamphetamine (ADDERALL XR) 20 MG 24 hr capsule [[id number]]   Has the patient contacted their pharmacy? Yes  (Agent: If no, request that the patient contact the pharmacy for the refill.) (Agent: If yes, when and what did the pharmacy advise?)  Preferred Pharmacy (with phone number or street name): [id number] - [place], [place] [phone] (Phone) [phone] (Fax)   Agent: Please be advised that RX refills may take up to 3 business days. We ask that you follow-up with your pharmacy.

## 2018-08-21 ENCOUNTER — Ambulatory Visit (INDEPENDENT_AMBULATORY_CARE_PROVIDER_SITE_OTHER): Payer: 59 | Admitting: Family Medicine

## 2018-08-21 VITALS — BP 98/68 | HR 75 | Temp 98.2°F | Resp 18 | Wt 117.0 lb

## 2018-08-21 DIAGNOSIS — M25521 Pain in right elbow: Secondary | ICD-10-CM | POA: Diagnosis not present

## 2018-08-21 DIAGNOSIS — F988 Other specified behavioral and emotional disorders with onset usually occurring in childhood and adolescence: Secondary | ICD-10-CM

## 2018-08-21 MED ORDER — AMPHETAMINE-DEXTROAMPHET ER 10 MG PO CP24: 10.0000 mg | ORAL_CAPSULE | Freq: Every day | ORAL | 0 refills | Status: DC

## 2018-08-21 NOTE — Progress Notes (Signed)
Subjective:    Patient ID: Sandra Long, female    DOB: 07-19-1975, 43 y.o.   MRN: [id number]  No chief complaint on file.   HPI Patient is in today for follow up on ADD and right elbow pain. She notes the Adderall X are 20 mg is working much better than her Ritalin did.  It is helping her focus the concern is that it if she takes it later than 7 AM she has trouble sleeping and she is interested in perhaps trying a lower dose of the Adderall to see if that is more beneficial.  No concerning side effects such as chest pain or palpitations.  Her other concern is of recurrent trouble with her right elbow.  She reports in retrospect she has had trouble off and on with her right elbow for several years.  The current episode was sparked last May 2019 when she lifted a heavy load of plates she had a painful sensation in her right elbow and it is never fully recovered since that time she notes it awakens her at night due to the level of discomfort frequently.  She denies redness or warmth no radicular symptoms into the hand but the pain is bad enough she is ready to see a specialist at this time. Denies CP/palp/SOB/HA/congestion/fevers/GI or GU c/o. Taking meds as prescribed  Past Medical History:  Diagnosis Date  . Anemia 06/07/2016  . Cervical disc disease 11/25/2014   MRI 2014  1. Moderate left central and foraminal stenosis due to a large left lateral recess disc protrusion at C6-7, potentially with a small amount of marginal blood products - correlate with chronicity of onset of patient's symptoms. 2. There is also moderate right eccentric central stenosis at the C5-6 level due to a right paracentral disc protrusion. 3. Chronic ethmoid sinusitis. 4. Mild   . Dog bite 10/20/2016  . Dyslipidemia 06/07/2016  . Eye disease 12/07/2014   Macular rosacea  . Gestational diabetes 2009  . Hyperglycemia 06/07/2016  . Palpitations 11/25/2014  . Paresthesia 11/25/2014  . Preventative health care 12/07/2014      Past Surgical History:  Procedure Laterality Date  . CESAREAN SECTION  2007  . CESAREAN SECTION N/A 09/08/2015   Procedure: Repeat CESAREAN SECTION;  Surgeon: Brien Few, MD;  Location: [facility];  Service: Obstetrics;  Laterality: N/A;  EDD: 09/14/15  . WISDOM TOOTH EXTRACTION      Family History  Problem Relation Age of Onset  . Heart disease Mother        2 stents 2015  . Tremor Mother        benign essential  . Heart disease Father        stents  . Diabetes Father        33  . Stroke Father   . Gout Father   . Blindness Father   . Gout Brother   . COPD Son   . Cancer Maternal Grandfather        Multiple Myeloma  . Stroke Paternal Grandmother   . Cancer Paternal Grandfather        bladder cancer    Social History   Socioeconomic History  . Marital status: Married    Spouse name: Not on file  . Number of children: Not on file  . Years of education: Not on file  . Highest education level: Not on file  Occupational History  . Occupation: RN [employer]  Social Needs  . Financial  resource strain: Not on file  . Food insecurity:    Worry: Not on file    Inability: Not on file  . Transportation needs:    Medical: Not on file    Non-medical: Not on file  Tobacco Use  . Smoking status: Never Smoker  . Smokeless tobacco: Never Used  Substance and Sexual Activity  . Alcohol use: Yes    Alcohol/week: 0.0 standard drinks  . Drug use: No  . Sexual activity: Yes    Comment: lives with husband, no red meat, works as Therapist, sports at [employer]  Lifestyle  . Physical activity:    Days per week: Not on file    Minutes per session: Not on file  . Stress: Not on file  Relationships  . Social connections:    Talks on phone: Not on file    Gets together: Not on file    Attends religious service: Not on file    Active member of club or organization: Not on file    Attends meetings of clubs or organizations: Not on file    Relationship status:  Not on file  . Intimate partner violence:    Fear of current or ex partner: Not on file    Emotionally abused: Not on file    Physically abused: Not on file    Forced sexual activity: Not on file  Other Topics Concern  . Not on file  Social History Narrative  . Not on file    Outpatient Medications Prior to Visit  Medication Sig Dispense Refill  . ibuprofen (ADVIL,MOTRIN) 600 MG tablet Take 1 tablet (600 mg total) by mouth every 6 (six) hours. 30 tablet 0  . Magnesium 200 MG TABS Take 400 mg by mouth daily.    . Multiple Vitamins-Minerals (MULTIVITAMIN ADULT PO) Take 1 tablet by mouth daily.    . Olopatadine HCl 0.2 % SOLN Use one drop daily. 1 Bottle 0  . valACYclovir (VALTREX) 1000 MG tablet Take 2 tablets by mouth now and repeat in 12 hours 4 tablet 2  . Vitamin D, Cholecalciferol, 1000 UNITS TABS Take 2,000 Units by mouth daily.    Marland Kitchen amphetamine-dextroamphetamine (ADDERALL XR) 20 MG 24 hr capsule Take 1 capsule (20 mg total) by mouth every morning. December 2019 30 capsule 0  . amphetamine-dextroamphetamine (ADDERALL XR) 20 MG 24 hr capsule Take 1 capsule (20 mg total) by mouth daily. November 2019 30 capsule 0  . amphetamine-dextroamphetamine (ADDERALL XR) 20 MG 24 hr capsule Take 1 capsule (20 mg total) by mouth daily. October 2019 30 capsule 0   No facility-administered medications prior to visit.     No Known Allergies  Review of Systems  Constitutional: Negative for fever and malaise/fatigue.  HENT: Negative for congestion.   Eyes: Negative for blurred vision.  Respiratory: Negative for shortness of breath.   Cardiovascular: Negative for chest pain, palpitations and leg swelling.  Gastrointestinal: Negative for abdominal pain, blood in stool and nausea.  Genitourinary: Negative for dysuria and frequency.  Musculoskeletal: Positive for joint pain. Negative for falls.  Skin: Negative for rash.  Neurological: Negative for dizziness, loss of consciousness and headaches.    Endo/Heme/Allergies: Negative for environmental allergies.  Psychiatric/Behavioral: Negative for depression. The patient is not nervous/anxious.        Objective:    Physical Exam Vitals signs and nursing note reviewed.  Constitutional:      General: She is not in acute distress.    Appearance: She is well-developed.  HENT:  Head: Normocephalic and atraumatic.     Nose: Nose normal.  Eyes:     General:        Right eye: No discharge.        Left eye: No discharge.  Neck:     Musculoskeletal: Normal range of motion and neck supple.  Cardiovascular:     Rate and Rhythm: Normal rate and regular rhythm.     Heart sounds: No murmur.  Pulmonary:     Effort: Pulmonary effort is normal.     Breath sounds: Normal breath sounds.  Abdominal:     General: Bowel sounds are normal.     Palpations: Abdomen is soft.     Tenderness: There is no abdominal tenderness.  Skin:    General: Skin is warm and dry.  Neurological:     Mental Status: She is alert and oriented to person, place, and time.     BP 98/68 (BP Location: Left Arm, Patient Position: Sitting, Cuff Size: Normal)   Pulse 75   Temp 98.2 F (36.8 C) (Oral)   Resp 18   Wt 117 lb (53.1 kg)   SpO2 99%   BMI 20.73 kg/m  Wt Readings from Last 3 Encounters:  08/21/18 117 lb (53.1 kg)  04/19/18 114 lb 9.6 oz (52 kg)  03/12/18 115 lb 12.8 oz (52.5 kg)     Lab Results  Component Value Date   WBC 5.7 01/01/2018   HGB 12.8 01/01/2018   HCT 38.5 01/01/2018   PLT 203.0 01/01/2018   GLUCOSE 89 01/01/2018   CHOL 147 01/01/2018   TRIG 53.0 01/01/2018   HDL 53.00 01/01/2018   LDLCALC 83 01/01/2018   ALT 19 01/01/2018   AST 23 01/01/2018   NA 141 01/01/2018   K 4.8 01/01/2018   CL 104 01/01/2018   CREATININE 0.54 01/01/2018   BUN 8 01/01/2018   CO2 31 01/01/2018   TSH 1.44 01/01/2018   HGBA1C 5.8 01/01/2018    Lab Results  Component Value Date   TSH 1.44 01/01/2018   Lab Results  Component Value Date    WBC 5.7 01/01/2018   HGB 12.8 01/01/2018   HCT 38.5 01/01/2018   MCV 93.4 01/01/2018   PLT 203.0 01/01/2018   Lab Results  Component Value Date   NA 141 01/01/2018   K 4.8 01/01/2018   CO2 31 01/01/2018   GLUCOSE 89 01/01/2018   BUN 8 01/01/2018   CREATININE 0.54 01/01/2018   BILITOT 0.6 01/01/2018   ALKPHOS 31 (L) 01/01/2018   AST 23 01/01/2018   ALT 19 01/01/2018   PROT 6.6 01/01/2018   ALBUMIN 4.3 01/01/2018   CALCIUM 9.2 01/01/2018   GFR 131.89 01/01/2018   Lab Results  Component Value Date   CHOL 147 01/01/2018   Lab Results  Component Value Date   HDL 53.00 01/01/2018   Lab Results  Component Value Date   LDLCALC 83 01/01/2018   Lab Results  Component Value Date   TRIG 53.0 01/01/2018   Lab Results  Component Value Date   CHOLHDL 3 01/01/2018   Lab Results  Component Value Date   HGBA1C 5.8 01/01/2018       Assessment & Plan:   Problem List Items Addressed This Visit    Right elbow pain - Primary    Has had episodes of elbow pain off and on for a couple of years. This current episode started last May when she lifted some plates. Referred to [facility], Dr Amedeo Plenty  and xray ordered today and will proceed with MRI if insurance will let us. She has failed physical therapy      Relevant Orders   DG Elbow 2 Views Right   Ambulatory referral to Orthopedic Surgery   Attention deficit disorder    Adderal XR working well but disrupting sleep if she takes it after 7 am will try dropping to 10 mg and reassess         I have discontinued Sandra Long's amphetamine-dextroamphetamine, amphetamine-dextroamphetamine, and amphetamine-dextroamphetamine. I am also having her start on amphetamine-dextroamphetamine and amphetamine-dextroamphetamine. Additionally, I am having her maintain her Multiple Vitamins-Minerals (MULTIVITAMIN ADULT PO), Magnesium, Vitamin D (Cholecalciferol), ibuprofen, Olopatadine HCl, and valACYclovir.  Meds ordered this encounter   Medications  . amphetamine-dextroamphetamine (ADDERALL XR) 10 MG 24 hr capsule    Sig: Take 1 capsule (10 mg total) by mouth daily. March 2020    Dispense:  30 capsule    Refill:  0  . amphetamine-dextroamphetamine (ADDERALL XR) 10 MG 24 hr capsule    Sig: Take 1 capsule (10 mg total) by mouth daily. April 2020    Dispense:  30 capsule    Refill:  0     Penni Homans, MD

## 2018-08-21 NOTE — Assessment & Plan Note (Signed)
Adderal XR working well but disrupting sleep if she takes it after 7 am will try dropping to 10 mg and reassess

## 2018-08-21 NOTE — Patient Instructions (Signed)
Elbow Bursitis  Bursitis is swelling and pain at the tip of your elbow. This happens when fluid builds up in a sac under your skin (bursa). This may also be called olecranon bursitis.  Follow these instructions at home:  Medicines   Take over-the-counter and prescription medicines only as told by your doctor.   If you were prescribed an antibiotic, take it exactly as told by your doctor. Do not stop taking it even if you start to feel better.  Managing pain, stiffness, and swelling     If told, put ice on your elbow:  ? Put ice in a plastic bag.  ? Place a towel between your skin and the bag.  ? Leave the ice on for 20 minutes, 2-3 times a day.   If your bursitis is caused by an injury, follow instructions from your doctor about:  ? Resting your elbow.  ? Wearing a bandage.   Wear elbow pads or elbow wraps as needed. These help cushion your elbow.  General instructions   Avoid any activities that cause elbow pain. Ask your doctor what activities are safe for you.   Keep all follow-up visits as told by your doctor. This is important.  Contact a doctor if you have:   A fever.   Problems that do not get better with treatment.   Pain or swelling that:  ? Gets worse.  ? Goes away and then comes back.   Pus draining from your elbow.  Get help right away if you have:   Trouble moving your arm, hand, or fingers.  Summary   Bursitis is swelling and pain at the tip of the elbow.   You may need to take medicine or put ice on your elbow.   Contact your doctor if your problems do not get better with treatment.  This information is not intended to replace advice given to you by your health care provider. Make sure you discuss any questions you have with your health care provider.  Document Released: 12/01/2009 Document Revised: 05/23/2017 Document Reviewed: 05/23/2017  Elsevier Interactive Patient Education  2019 Elsevier Inc[employer]

## 2018-08-21 NOTE — Assessment & Plan Note (Signed)
Has had episodes of elbow pain off and on for a couple of years. This current episode started last May when she lifted some plates. Referred to [facility], Dr Amedeo Plenty and xray ordered today and will proceed with MRI if insurance will let us. She has failed physical therapy

## 2018-10-25 ENCOUNTER — Other Ambulatory Visit: Payer: Self-pay | Admitting: Family Medicine

## 2018-10-26 MED ORDER — AMPHETAMINE-DEXTROAMPHET ER 10 MG PO CP24: 10.0000 mg | ORAL_CAPSULE | Freq: Every day | ORAL | 0 refills | Status: DC

## 2018-10-26 NOTE — Telephone Encounter (Signed)
Requesting: Adderall XR Contract: N/A UDS: N/A Last OV: 08/21/2018 Next OV: N/A Last Refill: 08/21/2018, #[id number] RF Database:   Please advise

## 2018-12-03 ENCOUNTER — Other Ambulatory Visit: Payer: Self-pay | Admitting: Family Medicine

## 2018-12-03 MED ORDER — AMPHETAMINE-DEXTROAMPHET ER 10 MG PO CP24: 10.0000 mg | ORAL_CAPSULE | Freq: Every day | ORAL | 0 refills | Status: DC

## 2019-04-15 ENCOUNTER — Ambulatory Visit (INDEPENDENT_AMBULATORY_CARE_PROVIDER_SITE_OTHER): Payer: 59 | Admitting: Family Medicine

## 2019-04-15 ENCOUNTER — Other Ambulatory Visit: Payer: Self-pay

## 2019-04-15 DIAGNOSIS — F988 Other specified behavioral and emotional disorders with onset usually occurring in childhood and adolescence: Secondary | ICD-10-CM

## 2019-04-15 DIAGNOSIS — E785 Hyperlipidemia, unspecified: Secondary | ICD-10-CM | POA: Diagnosis not present

## 2019-04-15 DIAGNOSIS — R002 Palpitations: Secondary | ICD-10-CM

## 2019-04-15 DIAGNOSIS — R739 Hyperglycemia, unspecified: Secondary | ICD-10-CM

## 2019-04-15 MED ORDER — LISDEXAMFETAMINE DIMESYLATE 10 MG PO CAPS
10.0000 mg | ORAL_CAPSULE | Freq: Every day | ORAL | 0 refills | Status: DC
Start: 2019-04-15 — End: 2019-06-10

## 2019-04-18 ENCOUNTER — Telehealth: Payer: Self-pay | Admitting: Family Medicine

## 2019-04-18 NOTE — Telephone Encounter (Signed)
LM to schedule lab visit next week, virtual f/u in 3 weeks, and cpe in January 2021

## 2019-04-19 ENCOUNTER — Other Ambulatory Visit: Payer: Self-pay | Admitting: Family Medicine

## 2019-04-19 DIAGNOSIS — Z1231 Encounter for screening mammogram for malignant neoplasm of breast: Secondary | ICD-10-CM

## 2019-04-21 NOTE — Progress Notes (Signed)
Virtual Visit via Video Note  I connected with Sandra Long on 04/15/19 at  3:20 PM EDT by a video enabled telemedicine application and verified that I am speaking with the correct person using two identifiers.  Location: Patient: home Provider: office   I discussed the limitations of evaluation and management by telemedicine and the availability of in person appointments. The patient expressed understanding and agreed to proceed. Magdalene Molly, CMA was able to get the patient set up on a video visit   Subjective:    Patient ID: Sandra Long, female    DOB: 27-Jan-1976, 43 y.o.   MRN: [id number]  No chief complaint on file.   HPI Patient is in today for follow up on chronic medical concerns including hyperglycemia, hyperlipidemia, attention deficit disorder and more. No recent febrile illness or hospitalizations. She has been working again and feels she needs to start medications again. Is interested in trying to switch to Vyvanse to see if she has less side effects. Denies CP/palp/SOB/HA/congestion/fevers/GI or GU c/o. Taking meds as prescribed  Past Medical History:  Diagnosis Date  . Anemia 06/07/2016  . Cervical disc disease 11/25/2014   MRI 2014  1. Moderate left central and foraminal stenosis due to a large left lateral recess disc protrusion at C6-7, potentially with a small amount of marginal blood products - correlate with chronicity of onset of patient's symptoms. 2. There is also moderate right eccentric central stenosis at the C5-6 level due to a right paracentral disc protrusion. 3. Chronic ethmoid sinusitis. 4. Mild   . Dog bite 10/20/2016  . Dyslipidemia 06/07/2016  . Eye disease 12/07/2014   Macular rosacea  . Gestational diabetes 2009  . Hyperglycemia 06/07/2016  . Palpitations 11/25/2014  . Paresthesia 11/25/2014  . Preventative health care 12/07/2014    Past Surgical History:  Procedure Laterality Date  . CESAREAN SECTION  2007  . CESAREAN SECTION N/A  09/08/2015   Procedure: Repeat CESAREAN SECTION;  Surgeon: Brien Few, MD;  Location: [facility];  Service: Obstetrics;  Laterality: N/A;  EDD: 09/14/15  . WISDOM TOOTH EXTRACTION      Family History  Problem Relation Age of Onset  . Heart disease Mother        2 stents 2015  . Tremor Mother        benign essential  . Heart disease Father        stents  . Diabetes Father        33  . Stroke Father   . Gout Father   . Blindness Father   . Gout Brother   . COPD Son   . Cancer Maternal Grandfather        Multiple Myeloma  . Stroke Paternal Grandmother   . Cancer Paternal Grandfather        bladder cancer    Social History   Socioeconomic History  . Marital status: Married    Spouse name: Not on file  . Number of children: Not on file  . Years of education: Not on file  . Highest education level: Not on file  Occupational History  . Occupation: RN [employer]  Social Needs  . Financial resource strain: Not on file  . Food insecurity    Worry: Not on file    Inability: Not on file  . Transportation needs    Medical: Not on file    Non-medical: Not on file  Tobacco Use  . Smoking status: Never Smoker  .  Smokeless tobacco: Never Used  Substance and Sexual Activity  . Alcohol use: Yes    Alcohol/week: 0.0 standard drinks  . Drug use: No  . Sexual activity: Yes    Comment: lives with husband, no red meat, works as Therapist, sports at [employer]  Lifestyle  . Physical activity    Days per week: Not on file    Minutes per session: Not on file  . Stress: Not on file  Relationships  . Social Herbalist on phone: Not on file    Gets together: Not on file    Attends religious service: Not on file    Active member of club or organization: Not on file    Attends meetings of clubs or organizations: Not on file    Relationship status: Not on file  . Intimate partner violence    Fear of current or ex partner: Not on file    Emotionally abused:  Not on file    Physically abused: Not on file    Forced sexual activity: Not on file  Other Topics Concern  . Not on file  Social History Narrative  . Not on file    Outpatient Medications Prior to Visit  Medication Sig Dispense Refill  . ibuprofen (ADVIL,MOTRIN) 600 MG tablet Take 1 tablet (600 mg total) by mouth every 6 (six) hours. 30 tablet 0  . Magnesium 200 MG TABS Take 400 mg by mouth daily.    . Multiple Vitamins-Minerals (MULTIVITAMIN ADULT PO) Take 1 tablet by mouth daily.    . Olopatadine HCl 0.2 % SOLN Use one drop daily. 1 Bottle 0  . valACYclovir (VALTREX) 1000 MG tablet Take 2 tablets by mouth now and repeat in 12 hours 4 tablet 2  . Vitamin D, Cholecalciferol, 1000 UNITS TABS Take 2,000 Units by mouth daily.    Marland Kitchen amphetamine-dextroamphetamine (ADDERALL XR) 10 MG 24 hr capsule Take 1 capsule (10 mg total) by mouth daily. March 2020 30 capsule 0  . amphetamine-dextroamphetamine (ADDERALL XR) 10 MG 24 hr capsule Take 1 capsule (10 mg total) by mouth daily. June 2020 30 capsule 0   No facility-administered medications prior to visit.     No Known Allergies  Review of Systems  Constitutional: Positive for malaise/fatigue. Negative for fever.  HENT: Negative for congestion.   Eyes: Negative for blurred vision.  Respiratory: Negative for shortness of breath.   Cardiovascular: Negative for chest pain, palpitations and leg swelling.  Gastrointestinal: Negative for abdominal pain, blood in stool and nausea.  Genitourinary: Negative for dysuria and frequency.  Musculoskeletal: Negative for falls.  Skin: Negative for rash.  Neurological: Negative for dizziness, loss of consciousness and headaches.  Endo/Heme/Allergies: Negative for environmental allergies.  Psychiatric/Behavioral: Negative for depression. The patient is not nervous/anxious.        Objective:    Physical Exam Constitutional:      Appearance: Normal appearance. She is not ill-appearing.  HENT:      Head: Normocephalic and atraumatic.     Nose: Nose normal.  Pulmonary:     Effort: Pulmonary effort is normal.  Neurological:     Mental Status: She is alert and oriented to person, place, and time.  Psychiatric:        Mood and Affect: Mood normal.        Behavior: Behavior normal.     There were no vitals taken for this visit. Wt Readings from Last 3 Encounters:  08/21/18 117 lb (53.1 kg)  04/19/18 114 lb  9.6 oz (52 kg)  03/12/18 115 lb 12.8 oz (52.5 kg)    Diabetic Foot Exam - Simple   No data filed     Lab Results  Component Value Date   WBC 5.7 01/01/2018   HGB 12.8 01/01/2018   HCT 38.5 01/01/2018   PLT 203.0 01/01/2018   GLUCOSE 89 01/01/2018   CHOL 147 01/01/2018   TRIG 53.0 01/01/2018   HDL 53.00 01/01/2018   LDLCALC 83 01/01/2018   ALT 19 01/01/2018   AST 23 01/01/2018   NA 141 01/01/2018   K 4.8 01/01/2018   CL 104 01/01/2018   CREATININE 0.54 01/01/2018   BUN 8 01/01/2018   CO2 31 01/01/2018   TSH 1.44 01/01/2018   HGBA1C 5.8 01/01/2018    Lab Results  Component Value Date   TSH 1.44 01/01/2018   Lab Results  Component Value Date   WBC 5.7 01/01/2018   HGB 12.8 01/01/2018   HCT 38.5 01/01/2018   MCV 93.4 01/01/2018   PLT 203.0 01/01/2018   Lab Results  Component Value Date   NA 141 01/01/2018   K 4.8 01/01/2018   CO2 31 01/01/2018   GLUCOSE 89 01/01/2018   BUN 8 01/01/2018   CREATININE 0.54 01/01/2018   BILITOT 0.6 01/01/2018   ALKPHOS 31 (L) 01/01/2018   AST 23 01/01/2018   ALT 19 01/01/2018   PROT 6.6 01/01/2018   ALBUMIN 4.3 01/01/2018   CALCIUM 9.2 01/01/2018   GFR 131.89 01/01/2018   Lab Results  Component Value Date   CHOL 147 01/01/2018   Lab Results  Component Value Date   HDL 53.00 01/01/2018   Lab Results  Component Value Date   LDLCALC 83 01/01/2018   Lab Results  Component Value Date   TRIG 53.0 01/01/2018   Lab Results  Component Value Date   CHOLHDL 3 01/01/2018   Lab Results  Component Value  Date   HGBA1C 5.8 01/01/2018       Assessment & Plan:   Problem List Items Addressed This Visit    Palpitations   Relevant Orders   CBC   Comprehensive metabolic panel   TSH   Hyperglycemia - Primary    hgba1c acceptable, minimize simple carbs. Increase exercise as tolerated.       Relevant Orders   Hemoglobin A1c   Dyslipidemia    Encouraged heart healthy diet, increase exercise, avoid trans fats and simple carbs      Relevant Orders   Lipid panel   Attention deficit disorder    Has not been on meds recently but is working again will try Vyvanse 10 mg daily and see if that is tolerated better.          I have discontinued Evette Georges Clack's amphetamine-dextroamphetamine and amphetamine-dextroamphetamine. I am also having her start on lisdexamfetamine. Additionally, I am having her maintain her Multiple Vitamins-Minerals (MULTIVITAMIN ADULT PO), Magnesium, Vitamin D (Cholecalciferol), ibuprofen, Olopatadine HCl, and valACYclovir.  Meds ordered this encounter  Medications  . lisdexamfetamine (VYVANSE) 10 MG capsule    Sig: Take 1 capsule (10 mg total) by mouth daily.    Dispense:  30 capsule    Refill:  0       I discussed the assessment and treatment plan with the patient. The patient was provided an opportunity to ask questions and all were answered. The patient agreed with the plan and demonstrated an understanding of the instructions.   The patient was advised to call back or seek  an in-person evaluation if the symptoms worsen or if the condition fails to improve as anticipated.  I provided 25 minutes of non-face-to-face time during this encounter.   Penni Homans, MD

## 2019-04-21 NOTE — Assessment & Plan Note (Signed)
hgba1c acceptable, minimize simple carbs. Increase exercise as tolerated.  

## 2019-04-21 NOTE — Assessment & Plan Note (Signed)
Encouraged heart healthy diet, increase exercise, avoid trans fats and simple carbs.  

## 2019-04-21 NOTE — Assessment & Plan Note (Signed)
Has not been on meds recently but is working again will try Vyvanse 10 mg daily and see if that is tolerated better.

## 2019-04-23 ENCOUNTER — Other Ambulatory Visit: Payer: 59

## 2019-04-26 ENCOUNTER — Other Ambulatory Visit: Payer: 59

## 2019-05-01 ENCOUNTER — Other Ambulatory Visit (INDEPENDENT_AMBULATORY_CARE_PROVIDER_SITE_OTHER): Payer: 59

## 2019-05-01 ENCOUNTER — Other Ambulatory Visit: Payer: Self-pay

## 2019-05-01 DIAGNOSIS — R002 Palpitations: Secondary | ICD-10-CM

## 2019-05-01 DIAGNOSIS — R739 Hyperglycemia, unspecified: Secondary | ICD-10-CM | POA: Diagnosis not present

## 2019-05-01 DIAGNOSIS — E785 Hyperlipidemia, unspecified: Secondary | ICD-10-CM | POA: Diagnosis not present

## 2019-05-01 LAB — COMPREHENSIVE METABOLIC PANEL
ALT: 20 U/L (ref 0–35)
AST: 24 U/L (ref 0–37)
Albumin: 4.2 g/dL (ref 3.5–5.2)
Alkaline Phosphatase: 33 U/L — ABNORMAL LOW (ref 39–117)
BUN: 8 mg/dL (ref 6–23)
CO2: 31 mEq/L (ref 19–32)
Calcium: 8.9 mg/dL (ref 8.4–10.5)
Chloride: 104 mEq/L (ref 96–112)
Creatinine, Ser: 0.65 mg/dL (ref 0.40–1.20)
GFR: 99.55 mL/min (ref >60.00)
Glucose, Bld: 101 mg/dL — ABNORMAL HIGH (ref 70–99)
Potassium: 4.2 mEq/L (ref 3.5–5.1)
Sodium: 140 mEq/L (ref 135–145)
Total Bilirubin: 0.6 mg/dL (ref 0.2–1.2)
Total Protein: 6.6 g/dL (ref 6.0–8.3)

## 2019-05-01 LAB — CBC
HCT: 41.7 % (ref 36.0–46.0)
Hemoglobin: 13.8 g/dL (ref 12.0–15.0)
MCHC: 33 g/dL (ref 30.0–36.0)
MCV: 93.9 fl (ref 78.0–100.0)
Platelets: 244 10*3/uL (ref 150.0–400.0)
RBC: 4.45 Mil/uL (ref 3.87–5.11)
RDW: 13.8 % (ref 11.5–15.5)
WBC: 4.3 10*3/uL (ref 4.0–10.5)

## 2019-05-01 LAB — LIPID PANEL
Cholesterol: 139 mg/dL (ref 0–200)
HDL: 51.2 mg/dL (ref >39.00)
LDL Cholesterol: 80 mg/dL (ref 0–99)
NonHDL: 87.78
Total CHOL/HDL Ratio: 3
Triglycerides: 37 mg/dL (ref 0.0–149.0)
VLDL: 7.4 mg/dL (ref 0.0–40.0)

## 2019-05-01 LAB — HEMOGLOBIN A1C: Hgb A1c MFr Bld: 5.6 % (ref 4.6–6.5)

## 2019-05-01 LAB — TSH: TSH: 1.92 u[IU]/mL (ref 0.35–4.50)

## 2019-05-09 ENCOUNTER — Other Ambulatory Visit: Payer: Self-pay

## 2019-05-09 ENCOUNTER — Ambulatory Visit (INDEPENDENT_AMBULATORY_CARE_PROVIDER_SITE_OTHER): Payer: 59

## 2019-05-09 DIAGNOSIS — Z1231 Encounter for screening mammogram for malignant neoplasm of breast: Secondary | ICD-10-CM | POA: Diagnosis not present

## 2019-05-16 ENCOUNTER — Encounter: Payer: Self-pay | Admitting: Family Medicine

## 2019-05-16 ENCOUNTER — Other Ambulatory Visit: Payer: Self-pay

## 2019-05-16 ENCOUNTER — Ambulatory Visit (INDEPENDENT_AMBULATORY_CARE_PROVIDER_SITE_OTHER): Payer: 59 | Admitting: Family Medicine

## 2019-05-16 DIAGNOSIS — F419 Anxiety disorder, unspecified: Secondary | ICD-10-CM

## 2019-05-16 DIAGNOSIS — R739 Hyperglycemia, unspecified: Secondary | ICD-10-CM | POA: Diagnosis not present

## 2019-05-16 DIAGNOSIS — F988 Other specified behavioral and emotional disorders with onset usually occurring in childhood and adolescence: Secondary | ICD-10-CM | POA: Diagnosis not present

## 2019-05-16 MED ORDER — LISDEXAMFETAMINE DIMESYLATE 20 MG PO CAPS: 20.0000 mg | ORAL_CAPSULE | Freq: Every day | ORAL | 0 refills | Status: DC

## 2019-05-16 MED ORDER — FLUOXETINE HCL (PMDD) 10 MG PO TABS: 10.0000 mg | ORAL_TABLET | Freq: Every day | ORAL | 2 refills | Status: DC

## 2019-05-16 NOTE — Assessment & Plan Note (Signed)
hgba1c acceptable, minimize simple carbs. Increase exercise as tolerated.  

## 2019-05-16 NOTE — Progress Notes (Signed)
May want to increase vyvanse to 20mg 

## 2019-05-19 DIAGNOSIS — F419 Anxiety disorder, unspecified: Secondary | ICD-10-CM | POA: Insufficient documentation

## 2019-05-19 NOTE — Assessment & Plan Note (Signed)
Vyvanse without concernind side effects and helpful but not completely. Will try increasing strength to 20 mg and reassess

## 2019-05-19 NOTE — Progress Notes (Signed)
Patient ID: Sandra Long, female   DOB: 04/22/1976, 43 y.o.   MRN: [id number] Virtual Visit via Video Note  I connected with Sandra Long on 11/19/20at  8:40 AM EST by a video enabled telemedicine application and verified that I am speaking with the correct person using two identifiers.  Location: Patient: home Provider: office   I discussed the limitations of evaluation and management by telemedicine and the availability of in person appointments. The patient expressed understanding and agreed to proceed. Princess cArter CMA was able to get the patient set up with a video visit   Subjective:    Patient ID: Sandra Long, female    DOB: 09/04/1975, 43 y.o.   MRN: [id number]  No chief complaint on file.   HPI Patient is in today for follow up on multiple medical concerns including ADD and hyperglycemia. She feels well today. No recent febrile illness or hospitalizations. She is noting a good response to Vyvanse but incomplete. No side effects but she feels it needs to be a little stronger. 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 No recent febrile illness or hospitalizations. No polyuria or polydipsia. Denies CP/palp/SOB/HA/congestion/fevers/GI or GU c/o. Taking meds as prescribed  Past Medical History:  Diagnosis Date  . Anemia 06/07/2016  . Cervical disc disease 11/25/2014   MRI 2014  1. Moderate left central and foraminal stenosis due to a large left lateral recess disc protrusion at C6-7, potentially with a small amount of marginal blood products - correlate with chronicity of onset of patient's symptoms. 2. There is also moderate right eccentric central stenosis at the C5-6 level due to a right paracentral disc protrusion. 3. Chronic ethmoid sinusitis. 4. Mild   . Dog bite 10/20/2016  . Dyslipidemia 06/07/2016  . Eye disease 12/07/2014   Macular rosacea  . Gestational diabetes 2009  . Hyperglycemia  06/07/2016  . Palpitations 11/25/2014  . Paresthesia 11/25/2014  . Preventative health care 12/07/2014    Past Surgical History:  Procedure Laterality Date  . CESAREAN SECTION  2007  . CESAREAN SECTION N/A 09/08/2015   Procedure: Repeat CESAREAN SECTION;  Surgeon: Brien Few, MD;  Location: [facility];  Service: Obstetrics;  Laterality: N/A;  EDD: 09/14/15  . WISDOM TOOTH EXTRACTION      Family History  Problem Relation Age of Onset  . Heart disease Mother        2 stents 2015  . Tremor Mother        benign essential  . Heart disease Father        stents  . Diabetes Father        61  . Stroke Father   . Gout Father   . Blindness Father   . Gout Brother   . COPD Son   . Cancer Maternal Grandfather        Multiple Myeloma  . Stroke Paternal Grandmother   . Cancer Paternal Grandfather        bladder cancer    Social History   Socioeconomic History  . Marital status: Married    Spouse name: Not on file  . Number of children: Not on file  . Years of education: Not on file  . Highest education level: Not on file  Occupational History  . Occupation: RN [employer]  Social Needs  . Financial resource strain: Not on file  . Food insecurity  Worry: Not on file    Inability: Not on file  . Transportation needs    Medical: Not on file    Non-medical: Not on file  Tobacco Use  . Smoking status: Never Smoker  . Smokeless tobacco: Never Used  Substance and Sexual Activity  . Alcohol use: Yes    Alcohol/week: 0.0 standard drinks  . Drug use: No  . Sexual activity: Yes    Comment: lives with husband, no red meat, works as Therapist, sports at [employer]  Lifestyle  . Physical activity    Days per week: Not on file    Minutes per session: Not on file  . Stress: Not on file  Relationships  . Social Herbalist on phone: Not on file    Gets together: Not on file    Attends religious service: Not on file    Active member of club or  organization: Not on file    Attends meetings of clubs or organizations: Not on file    Relationship status: Not on file  . Intimate partner violence    Fear of current or ex partner: Not on file    Emotionally abused: Not on file    Physically abused: Not on file    Forced sexual activity: Not on file  Other Topics Concern  . Not on file  Social History Narrative  . Not on file    Outpatient Medications Prior to Visit  Medication Sig Dispense Refill  . ibuprofen (ADVIL,MOTRIN) 600 MG tablet Take 1 tablet (600 mg total) by mouth every 6 (six) hours. 30 tablet 0  . lisdexamfetamine (VYVANSE) 10 MG capsule Take 1 capsule (10 mg total) by mouth daily. 30 capsule 0  . Magnesium 200 MG TABS Take 400 mg by mouth daily.    . Multiple Vitamins-Minerals (MULTIVITAMIN ADULT PO) Take 1 tablet by mouth daily.    . Olopatadine HCl 0.2 % SOLN Use one drop daily. 1 Bottle 0  . valACYclovir (VALTREX) 1000 MG tablet Take 2 tablets by mouth now and repeat in 12 hours 4 tablet 2  . Vitamin D, Cholecalciferol, 1000 UNITS TABS Take 2,000 Units by mouth daily.     No facility-administered medications prior to visit.     No Known Allergies  Review of Systems  Constitutional: Negative for fever and malaise/fatigue.  HENT: Negative for congestion.   Eyes: Negative for blurred vision.  Respiratory: Negative for shortness of breath.   Cardiovascular: Negative for chest pain, palpitations and leg swelling.  Gastrointestinal: Negative for abdominal pain, blood in stool and nausea.  Genitourinary: Negative for dysuria and frequency.  Musculoskeletal: Negative for falls.  Skin: Negative for rash.  Neurological: Negative for dizziness, loss of consciousness and headaches.  Endo/Heme/Allergies: Negative for environmental allergies.  Psychiatric/Behavioral: Negative for depression. The patient is nervous/anxious.        Objective:    Physical Exam Constitutional:      Appearance: Normal appearance.  She is not ill-appearing.  HENT:     Head: Normocephalic and atraumatic.  Eyes:     General:        Right eye: No discharge.        Left eye: No discharge.  Pulmonary:     Effort: Pulmonary effort is normal.  Neurological:     Mental Status: She is alert and oriented to person, place, and time.  Psychiatric:        Mood and Affect: Mood normal.  Behavior: Behavior normal.     BP 110/70 (BP Location: Left Arm, Patient Position: Sitting, Cuff Size: Normal)   Wt 120 lb (54.4 kg)   BMI 21.26 kg/m  Wt Readings from Last 3 Encounters:  05/16/19 120 lb (54.4 kg)  08/21/18 117 lb (53.1 kg)  04/19/18 114 lb 9.6 oz (52 kg)    Diabetic Foot Exam - Simple   No data filed     Lab Results  Component Value Date   WBC 4.3 05/01/2019   HGB 13.8 05/01/2019   HCT 41.7 05/01/2019   PLT 244.0 05/01/2019   GLUCOSE 101 (H) 05/01/2019   CHOL 139 05/01/2019   TRIG 37.0 05/01/2019   HDL 51.20 05/01/2019   LDLCALC 80 05/01/2019   ALT 20 05/01/2019   AST 24 05/01/2019   NA 140 05/01/2019   K 4.2 05/01/2019   CL 104 05/01/2019   CREATININE 0.65 05/01/2019   BUN 8 05/01/2019   CO2 31 05/01/2019   TSH 1.92 05/01/2019   HGBA1C 5.6 05/01/2019    Lab Results  Component Value Date   TSH 1.92 05/01/2019   Lab Results  Component Value Date   WBC 4.3 05/01/2019   HGB 13.8 05/01/2019   HCT 41.7 05/01/2019   MCV 93.9 05/01/2019   PLT 244.0 05/01/2019   Lab Results  Component Value Date   NA 140 05/01/2019   K 4.2 05/01/2019   CO2 31 05/01/2019   GLUCOSE 101 (H) 05/01/2019   BUN 8 05/01/2019   CREATININE 0.65 05/01/2019   BILITOT 0.6 05/01/2019   ALKPHOS 33 (L) 05/01/2019   AST 24 05/01/2019   ALT 20 05/01/2019   PROT 6.6 05/01/2019   ALBUMIN 4.2 05/01/2019   CALCIUM 8.9 05/01/2019   GFR 99.55 05/01/2019   Lab Results  Component Value Date   CHOL 139 05/01/2019   Lab Results  Component Value Date   HDL 51.20 05/01/2019   Lab Results  Component Value Date    LDLCALC 80 05/01/2019   Lab Results  Component Value Date   TRIG 37.0 05/01/2019   Lab Results  Component Value Date   CHOLHDL 3 05/01/2019   Lab Results  Component Value Date   HGBA1C 5.6 05/01/2019       Assessment & Plan:   Problem List Items Addressed This Visit    Hyperglycemia    hgba1c acceptable, minimize simple carbs. Increase exercise as tolerated.       Attention deficit disorder    Vyvanse without concernind side effects and helpful but not completely. 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 Will start FLuoxetine 10 mg qd and reassess. Report any concerning side effects.          I am having Sandra Georges. Enis start on lisdexamfetamine and Fluoxetine HCl (PMDD). I am also having her maintain her Multiple Vitamins-Minerals (MULTIVITAMIN ADULT PO), Magnesium, Vitamin D (Cholecalciferol), ibuprofen, Olopatadine HCl, valACYclovir, and lisdexamfetamine.  Meds ordered this encounter  Medications  . lisdexamfetamine (VYVANSE) 20 MG capsule    Sig: Take 1 capsule (20 mg total) by mouth daily.    Dispense:  30 capsule    Refill:  0  . Fluoxetine HCl, PMDD, 10 MG TABS    Sig: Take 1 tablet (10 mg total) by mouth daily.    Dispense:  30 tablet    Refill:  2      I discussed the assessment and treatment plan with the patient. The patient was provided an opportunity to ask questions and all were answered. The patient agreed with the plan and demonstrated an understanding of the instructions.   The patient was advised to call back or seek an in-person evaluation if the symptoms worsen or if the condition fails to improve as anticipated.  I provided 25 minutes of non-face-to-face time during this encounter.   Penni Homans, MD

## 2019-05-19 NOTE — Assessment & Plan Note (Signed)
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 Will start FLuoxetine 10 mg qd and reassess. Report any concerning side effects.

## 2019-06-07 ENCOUNTER — Other Ambulatory Visit: Payer: Self-pay | Admitting: Family Medicine

## 2019-06-10 ENCOUNTER — Encounter: Payer: Self-pay | Admitting: Family Medicine

## 2019-06-10 ENCOUNTER — Other Ambulatory Visit: Payer: Self-pay | Admitting: Family Medicine

## 2019-06-10 MED ORDER — LISDEXAMFETAMINE DIMESYLATE 10 MG PO CAPS: 10.0000 mg | ORAL_CAPSULE | Freq: Every day | ORAL | 0 refills | Status: DC

## 2019-06-10 MED ORDER — BUSPIRONE HCL 5 MG PO TABS: 5.0000 mg | ORAL_TABLET | Freq: Three times a day (TID) | ORAL | 0 refills | Status: DC

## 2019-06-11 NOTE — Telephone Encounter (Signed)
Please set patient up for a 3-4 week follow up  Please advise

## 2019-06-18 ENCOUNTER — Other Ambulatory Visit: Payer: Self-pay | Admitting: Family Medicine

## 2019-07-05 ENCOUNTER — Other Ambulatory Visit: Payer: Self-pay

## 2019-07-05 ENCOUNTER — Ambulatory Visit (INDEPENDENT_AMBULATORY_CARE_PROVIDER_SITE_OTHER): Payer: 59 | Admitting: Family Medicine

## 2019-07-05 DIAGNOSIS — F419 Anxiety disorder, unspecified: Secondary | ICD-10-CM

## 2019-07-05 DIAGNOSIS — F988 Other specified behavioral and emotional disorders with onset usually occurring in childhood and adolescence: Secondary | ICD-10-CM

## 2019-07-05 MED ORDER — BUSPIRONE HCL 10 MG PO TABS: 10.0000 mg | ORAL_TABLET | Freq: Three times a day (TID) | ORAL | 1 refills | Status: DC

## 2019-07-05 MED ORDER — LISDEXAMFETAMINE DIMESYLATE 10 MG PO CAPS: 10.0000 mg | ORAL_CAPSULE | Freq: Every day | ORAL | 0 refills | Status: DC

## 2019-07-05 NOTE — Assessment & Plan Note (Signed)
She has been using Vyvanse 10 mg on her shorter work days with adequate results but on her long work days she has to use 20 mg which then works well and she does believe that the Buspar has helped to mitigate the side effects from this increase. Will continue on the Vyvanse 10 mg caps but prescribe them as 1-2 tabs daily as needed.

## 2019-07-05 NOTE — Assessment & Plan Note (Addendum)
Felt some better after stopping the Prozac she feels the Buspar at 5 mg tid was marginally helpful and she feels more stressed without it. Has noted some viviid but not scary dreams. Will try increasing the Buspar to 10 mg po bid and then to tid as needed. F/u in 3-4 weeks or as needed

## 2019-07-05 NOTE — Progress Notes (Signed)
Virtual Visit via Video Note  I connected with Sandra Long on 07/05/19 at  9:40 AM EST by a video enabled telemedicine application and verified that I am speaking with the correct person using two identifiers.  Location: Patient: home Provider: home   I discussed the limitations of evaluation and management by telemedicine and the availability of in person appointments. The patient expressed understanding and agreed to proceed. Sandra Long, CMA was able to get the patient set up on a visit, video   Subjective:    Patient ID: Sandra Long, female    DOB: 08-13-1975, 44 y.o.   MRN: [id number]  No chief complaint on file.   HPI Patient is in today for follow up on chronic medical concerns including ADD and anxiety. She feels well today. She notes she felt some better immediately after stopping the Prozac. Her sexual dysfunction and irritability both improved. She feels the Buspar helped the anxiety slightly but not enough. She ran out prior to visit and she feels worse off of it. Her Vyvanse at 10 mg works on a light day but she needs 20 mg on Long days. Denies CP/palp/SOB/HA/congestion/fevers/GI or GU c/o. Taking meds as prescribed  Past Medical History:  Diagnosis Date  . Anemia 06/07/2016  . Cervical disc disease 11/25/2014   MRI 2014  1. Moderate left central and foraminal stenosis due to a large left lateral recess disc protrusion at C6-7, potentially with a small amount of marginal blood products - correlate with chronicity of onset of patient's symptoms. 2. There is also moderate right eccentric central stenosis at the C5-6 level due to a right paracentral disc protrusion. 3. Chronic ethmoid sinusitis. 4. Mild   . Dog bite 10/20/2016  . Dyslipidemia 06/07/2016  . Eye disease 12/07/2014   Macular rosacea  . Gestational diabetes 2009  . Hyperglycemia 06/07/2016  . Palpitations 11/25/2014  . Paresthesia 11/25/2014  . Preventative health care 12/07/2014    Past Surgical  History:  Procedure Laterality Date  . CESAREAN SECTION  2007  . CESAREAN SECTION N/A 09/08/2015   Procedure: Repeat CESAREAN SECTION;  Surgeon: Brien Few, MD;  Location: [facility];  Service: Obstetrics;  Laterality: N/A;  EDD: 09/14/15  . WISDOM TOOTH EXTRACTION      Family History  Problem Relation Age of Onset  . Heart disease Mother        2 stents 2015  . Tremor Mother        benign essential  . Heart disease Father        stents  . Diabetes Father        41  . Stroke Father   . Gout Father   . Blindness Father   . Gout Brother   . COPD Son   . Cancer Maternal Grandfather        Multiple Myeloma  . Stroke Paternal Grandmother   . Cancer Paternal Grandfather        bladder cancer    Social History   Socioeconomic History  . Marital status: Married    Spouse name: Not on file  . Number of children: Not on file  . Years of education: Not on file  . Highest education level: Not on file  Occupational History  . Occupation: RN [employer]  Tobacco Use  . Smoking status: Never Smoker  . Smokeless tobacco: Never Used  Substance and Sexual Activity  . Alcohol use: Yes    Alcohol/week: 0.0 standard drinks  .  Drug use: No  . Sexual activity: Yes    Comment: lives with husband, no red meat, works as Therapist, sports at [employer]  Other Topics Concern  . Not on file  Social History Narrative  . Not on file   Social Determinants of Health   Financial Resource Strain:   . Difficulty of Paying Living Expenses: Not on file  Food Insecurity:   . Worried About Charity fundraiser in the Last Year: Not on file  . Ran Out of Food in the Last Year: Not on file  Transportation Needs:   . Lack of Transportation (Medical): Not on file  . Lack of Transportation (Non-Medical): Not on file  Physical Activity:   . Days of Exercise per Week: Not on file  . Minutes of Exercise per Session: Not on file  Stress:   . Feeling of Stress : Not on file  Social  Connections:   . Frequency of Communication with Friends and Family: Not on file  . Frequency of Social Gatherings with Friends and Family: Not on file  . Attends Religious Services: Not on file  . Active Member of Clubs or Organizations: Not on file  . Attends Archivist Meetings: Not on file  . Marital Status: Not on file  Intimate Partner Violence:   . Fear of Current or Ex-Partner: Not on file  . Emotionally Abused: Not on file  . Physically Abused: Not on file  . Sexually Abused: Not on file    Outpatient Medications Prior to Visit  Medication Sig Dispense Refill  . ibuprofen (ADVIL,MOTRIN) 600 MG tablet Take 1 tablet (600 mg total) by mouth every 6 (six) hours. 30 tablet 0  . Magnesium 200 MG TABS Take 400 mg by mouth daily.    . Multiple Vitamins-Minerals (MULTIVITAMIN ADULT PO) Take 1 tablet by mouth daily.    . Olopatadine HCl 0.2 % SOLN Use one drop daily. 1 Bottle 0  . valACYclovir (VALTREX) 1000 MG tablet Take 2 tablets by mouth now and repeat in 12 hours 4 tablet 2  . Vitamin D, Cholecalciferol, 1000 UNITS TABS Take 2,000 Units by mouth daily.    . busPIRone (BUSPAR) 5 MG tablet TAKE 1 TABLET BY MOUTH THREE TIMES A DAY 270 tablet 1  . lisdexamfetamine (VYVANSE) 10 MG capsule Take 1 capsule (10 mg total) by mouth daily. 30 capsule 0   No facility-administered medications prior to visit.    No Known Allergies  Review of Systems  Constitutional: Negative for fever and malaise/fatigue.  HENT: Negative for congestion.   Eyes: Negative for blurred vision.  Respiratory: Negative for shortness of breath.   Cardiovascular: Negative for chest pain, palpitations and leg swelling.  Gastrointestinal: Negative for abdominal pain, blood in stool and nausea.  Genitourinary: Negative for dysuria and frequency.  Musculoskeletal: Negative for falls.  Skin: Negative for rash.  Neurological: Negative for dizziness, loss of consciousness and headaches.  Endo/Heme/Allergies:  Negative for environmental allergies.  Psychiatric/Behavioral: Negative for depression. The patient is nervous/anxious.        Objective:    Physical Exam Constitutional:      Appearance: Normal appearance. She is not ill-appearing.  HENT:     Head: Normocephalic and atraumatic.  Eyes:     General:        Right eye: No discharge.        Left eye: No discharge.  Pulmonary:     Effort: Pulmonary effort is normal.  Neurological:     Mental  Status: She is alert and oriented to person, place, and time.  Psychiatric:        Behavior: Behavior normal.     BP 105/68 (BP Location: Left Arm, Patient Position: Sitting, Cuff Size: Normal)   Pulse 67   Wt 120 lb (54.4 kg)   BMI 21.26 kg/m  Wt Readings from Last 3 Encounters:  07/05/19 120 lb (54.4 kg)  05/16/19 120 lb (54.4 kg)  08/21/18 117 lb (53.1 kg)    Diabetic Foot Exam - Simple   No data filed     Lab Results  Component Value Date   WBC 4.3 05/01/2019   HGB 13.8 05/01/2019   HCT 41.7 05/01/2019   PLT 244.0 05/01/2019   GLUCOSE 101 (H) 05/01/2019   CHOL 139 05/01/2019   TRIG 37.0 05/01/2019   HDL 51.20 05/01/2019   LDLCALC 80 05/01/2019   ALT 20 05/01/2019   AST 24 05/01/2019   NA 140 05/01/2019   K 4.2 05/01/2019   CL 104 05/01/2019   CREATININE 0.65 05/01/2019   BUN 8 05/01/2019   CO2 31 05/01/2019   TSH 1.92 05/01/2019   HGBA1C 5.6 05/01/2019    Lab Results  Component Value Date   TSH 1.92 05/01/2019   Lab Results  Component Value Date   WBC 4.3 05/01/2019   HGB 13.8 05/01/2019   HCT 41.7 05/01/2019   MCV 93.9 05/01/2019   PLT 244.0 05/01/2019   Lab Results  Component Value Date   NA 140 05/01/2019   K 4.2 05/01/2019   CO2 31 05/01/2019   GLUCOSE 101 (H) 05/01/2019   BUN 8 05/01/2019   CREATININE 0.65 05/01/2019   BILITOT 0.6 05/01/2019   ALKPHOS 33 (L) 05/01/2019   AST 24 05/01/2019   ALT 20 05/01/2019   PROT 6.6 05/01/2019   ALBUMIN 4.2 05/01/2019   CALCIUM 8.9 05/01/2019   GFR  99.55 05/01/2019   Lab Results  Component Value Date   CHOL 139 05/01/2019   Lab Results  Component Value Date   HDL 51.20 05/01/2019   Lab Results  Component Value Date   LDLCALC 80 05/01/2019   Lab Results  Component Value Date   TRIG 37.0 05/01/2019   Lab Results  Component Value Date   CHOLHDL 3 05/01/2019   Lab Results  Component Value Date   HGBA1C 5.6 05/01/2019       Assessment & Plan:   Problem List Items Addressed This Visit    Attention deficit disorder    She has been using Vyvanse 10 mg on her shorter work days with adequate results but on her Long work days she has to use 20 mg which then works well and she does believe that the Buspar has helped to mitigate the side effects from this increase. Will continue on the Vyvanse 10 mg caps but prescribe them as 1-2 tabs daily as needed.       Anxiety    Felt some better after stopping the Prozac she feels the Buspar at 5 mg tid was marginally helpful and she feels more stressed without it. Has noted some viviid but not scary dreams. Will try increasing the Buspar to 10 mg po bid and then to tid as needed. F/u in 3-4 weeks or as needed      Relevant Medications   busPIRone (BUSPAR) 10 MG tablet      I have discontinued Evette Georges. Radle's busPIRone. I have also changed her lisdexamfetamine. Additionally, I am having her start  on busPIRone. Lastly, I am having her maintain her Multiple Vitamins-Minerals (MULTIVITAMIN ADULT PO), Magnesium, Vitamin D (Cholecalciferol), ibuprofen, Olopatadine HCl, and valACYclovir.  Meds ordered this encounter  Medications  . lisdexamfetamine (VYVANSE) 10 MG capsule    Sig: Take 1-2 capsules (10-20 mg total) by mouth daily.    Dispense:  45 capsule    Refill:  0  . busPIRone (BUSPAR) 10 MG tablet    Sig: Take 1 tablet (10 mg total) by mouth 3 (three) times daily.    Dispense:  90 tablet    Refill:  1     I discussed the assessment and treatment plan with the patient. The  patient was provided an opportunity to ask questions and all were answered. The patient agreed with the plan and demonstrated an understanding of the instructions.   The patient was advised to call back or seek an in-person evaluation if the symptoms worsen or if the condition fails to improve as anticipated.  I provided 15 minutes of non-face-to-face time during this encounter.   Penni Homans, MD

## 2019-07-27 ENCOUNTER — Other Ambulatory Visit: Payer: Self-pay | Admitting: Family Medicine

## 2019-07-29 ENCOUNTER — Encounter: Payer: Self-pay | Admitting: Family Medicine

## 2019-07-30 NOTE — Telephone Encounter (Signed)
Please schedule patient a CPE in the near future  Please advise

## 2019-08-07 ENCOUNTER — Other Ambulatory Visit: Payer: Self-pay | Admitting: Family Medicine

## 2019-08-07 MED ORDER — LISDEXAMFETAMINE DIMESYLATE 10 MG PO CAPS: 10.0000 mg | ORAL_CAPSULE | Freq: Every day | ORAL | 0 refills | Status: DC

## 2019-08-07 NOTE — Telephone Encounter (Signed)
Requesting: Vyvanse Contract:n/a UDS:n/a Last Visit:07/05/19 Next Visit: 11/05/19 but advised to f/u 3-4 weeks  Last Refill:07/05/19(45,0)  Please Advise. Medication pending

## 2019-08-08 ENCOUNTER — Other Ambulatory Visit: Payer: Self-pay | Admitting: Family Medicine

## 2019-08-08 ENCOUNTER — Telehealth: Payer: Self-pay

## 2019-08-08 ENCOUNTER — Encounter: Payer: Self-pay | Admitting: Family Medicine

## 2019-08-08 ENCOUNTER — Other Ambulatory Visit: Payer: Self-pay

## 2019-08-08 MED ORDER — VALACYCLOVIR HCL 1 G PO TABS: ORAL_TABLET | ORAL | 2 refills | Status: DC

## 2019-08-08 NOTE — Telephone Encounter (Signed)
Patient called in to see if Dr. Charlett Blake could send in a prescription for     valACYclovir (VALTREX) 1000 MG tablet [id number]   To her Pharmacy.    Please follow up with the pt at [phone]   Thanks,

## 2019-10-01 ENCOUNTER — Other Ambulatory Visit: Payer: Self-pay | Admitting: Family Medicine

## 2019-10-01 MED ORDER — LISDEXAMFETAMINE DIMESYLATE 10 MG PO CAPS: 10.0000 mg | ORAL_CAPSULE | Freq: Every day | ORAL | 0 refills | Status: DC

## 2019-11-05 ENCOUNTER — Ambulatory Visit (INDEPENDENT_AMBULATORY_CARE_PROVIDER_SITE_OTHER): Payer: 59 | Admitting: Family Medicine

## 2019-11-05 ENCOUNTER — Other Ambulatory Visit (HOSPITAL_COMMUNITY)
Admission: RE | Admit: 2019-11-05 | Discharge: 2019-11-05 | Disposition: A | Discharge status: Home / Self Care | Payer: 59 | Source: Ambulatory Visit | Attending: Family Medicine | Admitting: Family Medicine

## 2019-11-05 ENCOUNTER — Other Ambulatory Visit: Payer: Self-pay

## 2019-11-05 ENCOUNTER — Encounter: Payer: Self-pay | Admitting: Family Medicine

## 2019-11-05 VITALS — BP 110/70 | HR 79 | Temp 98.0°F | Resp 12 | Ht 63.0 in | Wt 117.8 lb

## 2019-11-05 DIAGNOSIS — Z124 Encounter for screening for malignant neoplasm of cervix: Secondary | ICD-10-CM

## 2019-11-05 DIAGNOSIS — R739 Hyperglycemia, unspecified: Secondary | ICD-10-CM | POA: Diagnosis not present

## 2019-11-05 DIAGNOSIS — Z79899 Other long term (current) drug therapy: Secondary | ICD-10-CM | POA: Diagnosis not present

## 2019-11-05 DIAGNOSIS — E785 Hyperlipidemia, unspecified: Secondary | ICD-10-CM

## 2019-11-05 DIAGNOSIS — F419 Anxiety disorder, unspecified: Secondary | ICD-10-CM

## 2019-11-05 DIAGNOSIS — F988 Other specified behavioral and emotional disorders with onset usually occurring in childhood and adolescence: Secondary | ICD-10-CM | POA: Diagnosis not present

## 2019-11-05 DIAGNOSIS — Z0000 Encounter for general adult medical examination without abnormal findings: Secondary | ICD-10-CM | POA: Diagnosis not present

## 2019-11-05 LAB — CBC
HCT: 39.2 % (ref 36.0–46.0)
Hemoglobin: 13.2 g/dL (ref 12.0–15.0)
MCHC: 33.7 g/dL (ref 30.0–36.0)
MCV: 93.6 fl (ref 78.0–100.0)
Platelets: 199 10*3/uL (ref 150.0–400.0)
RBC: 4.2 Mil/uL (ref 3.87–5.11)
RDW: 13.6 % (ref 11.5–15.5)
WBC: 4.6 10*3/uL (ref 4.0–10.5)

## 2019-11-05 LAB — COMPREHENSIVE METABOLIC PANEL
ALT: 17 U/L (ref 0–35)
AST: 21 U/L (ref 0–37)
Albumin: 4.3 g/dL (ref 3.5–5.2)
Alkaline Phosphatase: 31 U/L — ABNORMAL LOW (ref 39–117)
BUN: 7 mg/dL (ref 6–23)
CO2: 28 mEq/L (ref 19–32)
Calcium: 9.3 mg/dL (ref 8.4–10.5)
Chloride: 103 mEq/L (ref 96–112)
Creatinine, Ser: 0.5 mg/dL (ref 0.40–1.20)
GFR: 134.43 mL/min (ref >60.00)
Glucose, Bld: 111 mg/dL — ABNORMAL HIGH (ref 70–99)
Potassium: 4.5 mEq/L (ref 3.5–5.1)
Sodium: 139 mEq/L (ref 135–145)
Total Bilirubin: 0.6 mg/dL (ref 0.2–1.2)
Total Protein: 6.5 g/dL (ref 6.0–8.3)

## 2019-11-05 LAB — LIPID PANEL
Cholesterol: 133 mg/dL (ref 0–200)
HDL: 53 mg/dL (ref >39.00)
LDL Cholesterol: 70 mg/dL (ref 0–99)
NonHDL: 80.48
Total CHOL/HDL Ratio: 3
Triglycerides: 54 mg/dL (ref 0.0–149.0)
VLDL: 10.8 mg/dL (ref 0.0–40.0)

## 2019-11-05 LAB — TSH: TSH: 3.76 u[IU]/mL (ref 0.35–4.50)

## 2019-11-05 LAB — HEMOGLOBIN A1C: Hgb A1c MFr Bld: 5.6 % (ref 4.6–6.5)

## 2019-11-05 MED ORDER — LISDEXAMFETAMINE DIMESYLATE 10 MG PO CAPS: 10.0000 mg | ORAL_CAPSULE | Freq: Every day | ORAL | 0 refills | Status: DC

## 2019-11-05 NOTE — Assessment & Plan Note (Signed)
Doing well on Vyvanse prn sometimes 0 sometimes 1 and sometimes 2 tabs depending oon her day and tolerates it well.

## 2019-11-05 NOTE — Assessment & Plan Note (Signed)
Encouraged heart healthy diet, increase exercise, avoid trans fats, consider a krill oil cap daily 

## 2019-11-05 NOTE — Assessment & Plan Note (Signed)
Did not tolerate the Buspar felt foggy. Felt much better on Prozac abut had sexual side effects so she stopped it and that went away

## 2019-11-05 NOTE — Assessment & Plan Note (Addendum)
Patient encouraged to maintain heart healthy diet, regular exercise, adequate sleep. Consider daily probiotics. Take medications as prescribed. Pap today. MGM due in November 2021.

## 2019-11-05 NOTE — Progress Notes (Signed)
Subjective:    Patient ID: Sandra Long, female    DOB: 1975/09/28, 44 y.o.   MRN: [id number]  Chief Complaint  Patient presents with  . Annual Exam    with pap    HPI Patient is in today for annual preventative examination and follow-up on chronic medical concerns.  No recent febrile illness or acute hospitalizations.  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  She notes the Vyvanse does help her to focus and get her worked in when she has longer or later shifts.  She does not take it every day if she takes it before 7 AM it is not disrupting her sleep.  She reports her anxiety and depression are manageable although still somewhat present without other medications.  She has had both of her Covid vaccinations.  She stays active and maintains a heart healthy diet. Denies CP/palp/SOB/HA/congestion/fevers/GI or GU c/o. Taking meds as prescribed  Past Medical History:  Diagnosis Date  . Anemia 06/07/2016  . Cervical disc disease 11/25/2014   MRI 2014  1. Moderate left central and foraminal stenosis due to a large left lateral recess disc protrusion at C6-7, potentially with a small amount of marginal blood products - correlate with chronicity of onset of patient's symptoms. 2. There is also moderate right eccentric central stenosis at the C5-6 level due to a right paracentral disc protrusion. 3. Chronic ethmoid sinusitis. 4. Mild   . Dog bite 10/20/2016  . Dyslipidemia 06/07/2016  . Eye disease 12/07/2014   Macular rosacea  . Gestational diabetes 2009  . Hyperglycemia 06/07/2016  . Palpitations 11/25/2014  . Paresthesia 11/25/2014  . Preventative health care 12/07/2014    Past Surgical History:  Procedure Laterality Date  . CESAREAN SECTION  2007  . CESAREAN SECTION N/A 09/08/2015   Procedure: Repeat CESAREAN SECTION;  Surgeon: Brien Few, MD;  Location: [facility];  Service: Obstetrics;  Laterality: N/A;  EDD:  09/14/15  . WISDOM TOOTH EXTRACTION      Family History  Problem Relation Age of Onset  . Heart disease Mother        2 stents 2015  . Tremor Mother        benign essential  . Colon polyps Mother   . Heart disease Father        stents  . Diabetes Father        14  . Stroke Father   . Gout Father   . Blindness Father   . Gout Brother   . COPD Son   . Cancer Maternal Grandfather        Multiple Myeloma  . Stroke Paternal Grandmother   . Cancer Paternal Grandfather        bladder cancer    Social History   Socioeconomic History  . Marital status: Married    Spouse name: Not on file  . Number of children: Not on file  . Years of education: Not on file  . Highest education level: Not on file  Occupational History  . Occupation: RN [employer]  Tobacco Use  . Smoking status: Never Smoker  . Smokeless tobacco: Never Used  Substance and Sexual Activity  . Alcohol use: Yes    Alcohol/week: 0.0 standard drinks  . Drug use: No  . Sexual activity: Yes    Comment: lives with husband, no red meat, works as Therapist, sports at  women's  Other Topics Concern  . Not on file  Social History Narrative  . Not on file   Social Determinants of Health   Financial Resource Strain:   . Difficulty of Paying Living Expenses:   Food Insecurity:   . Worried About Charity fundraiser in the Last Year:   . Arboriculturist in the Last Year:   Transportation Needs:   . Film/video editor (Medical):   Marland Kitchen Lack of Transportation (Non-Medical):   Physical Activity:   . Days of Exercise per Week:   . Minutes of Exercise per Session:   Stress:   . Feeling of Stress :   Social Connections:   . Frequency of Communication with Friends and Family:   . Frequency of Social Gatherings with Friends and Family:   . Attends Religious Services:   . Active Member of Clubs or Organizations:   . Attends Archivist Meetings:   Marland Kitchen Marital Status:   Intimate Partner  Violence:   . Fear of Current or Ex-Partner:   . Emotionally Abused:   Marland Kitchen Physically Abused:   . Sexually Abused:     Outpatient Medications Prior to Visit  Medication Sig Dispense Refill  . Magnesium 200 MG TABS Take 400 mg by mouth daily.    . Multiple Vitamins-Minerals (MULTIVITAMIN ADULT PO) Take 1 tablet by mouth daily.    . Olopatadine HCl 0.2 % SOLN Use one drop daily. 1 Bottle 0  . valACYclovir (VALTREX) 1000 MG tablet Take 2 tablets by mouth now and repeat in 12 hours 4 tablet 2  . Vitamin D, Cholecalciferol, 1000 UNITS TABS Take 2,000 Units by mouth daily.    Marland Kitchen lisdexamfetamine (VYVANSE) 10 MG capsule Take 1-2 capsules (10-20 mg total) by mouth daily. 45 capsule 0  . busPIRone (BUSPAR) 10 MG tablet TAKE 1 TABLET BY MOUTH THREE TIMES A DAY 270 tablet 1  . ibuprofen (ADVIL,MOTRIN) 600 MG tablet Take 1 tablet (600 mg total) by mouth every 6 (six) hours. 30 tablet 0   No facility-administered medications prior to visit.    No Known Allergies  Review of Systems  Constitutional: Negative for chills, fever and malaise/fatigue.  HENT: Negative for congestion and hearing loss.   Eyes: Negative for discharge.  Respiratory: Negative for cough, sputum production and shortness of breath.   Cardiovascular: Negative for chest pain, palpitations and leg swelling.  Gastrointestinal: Negative for abdominal pain, blood in stool, constipation, diarrhea, heartburn, nausea and vomiting.  Genitourinary: Positive for urgency. Negative for dysuria, frequency and hematuria.  Musculoskeletal: Negative for back pain, falls and myalgias.  Skin: Negative for rash.  Neurological: Negative for dizziness, sensory change, loss of consciousness, weakness and headaches.  Endo/Heme/Allergies: Negative for environmental allergies. Does not bruise/bleed easily.  Psychiatric/Behavioral: Positive for depression. Negative for substance abuse and suicidal ideas. The patient is nervous/anxious and has insomnia.         Objective:    Physical Exam Constitutional:      General: She is not in acute distress.    Appearance: She is well-developed.  HENT:     Head: Normocephalic and atraumatic.  Eyes:     Conjunctiva/sclera: Conjunctivae normal.  Neck:     Thyroid: No thyromegaly.  Cardiovascular:     Rate and Rhythm: Normal rate and regular rhythm.     Heart sounds: Normal heart sounds. No murmur.  Pulmonary:     Effort: Pulmonary effort is normal. No respiratory distress.     Breath  sounds: Normal breath sounds.  Abdominal:     General: Bowel sounds are normal. There is no distension.     Palpations: Abdomen is soft. There is no mass.     Tenderness: There is no abdominal tenderness.  Genitourinary:    General: Normal vulva.     Vagina: No vaginal discharge.     Comments: No masses, skin changes or discharge bilateral breasts Musculoskeletal:        General: No swelling or tenderness.     Cervical back: Neck supple.     Right lower leg: No edema.     Left lower leg: No edema.  Lymphadenopathy:     Cervical: No cervical adenopathy.  Skin:    General: Skin is warm and dry.     Findings: No rash.  Neurological:     Mental Status: She is alert and oriented to person, place, and time.     Deep Tendon Reflexes: Reflexes normal.  Psychiatric:        Behavior: Behavior normal.     BP 110/70 (BP Location: Left Arm, Cuff Size: Normal)   Pulse 79   Temp 98 F (36.7 C) (Temporal)   Resp 12   Ht 5' 3"  (1.6 m)   Wt 117 lb 12.8 oz (53.4 kg)   LMP 10/22/2019   SpO2 99%   BMI 20.87 kg/m  Wt Readings from Last 3 Encounters:  11/05/19 117 lb 12.8 oz (53.4 kg)  07/05/19 120 lb (54.4 kg)  05/16/19 120 lb (54.4 kg)    Diabetic Foot Exam - Simple   No data filed     Lab Results  Component Value Date   WBC 4.3 05/01/2019   HGB 13.8 05/01/2019   HCT 41.7 05/01/2019   PLT 244.0 05/01/2019   GLUCOSE 101 (H) 05/01/2019   CHOL 139 05/01/2019   TRIG 37.0 05/01/2019   HDL 51.20  05/01/2019   LDLCALC 80 05/01/2019   ALT 20 05/01/2019   AST 24 05/01/2019   NA 140 05/01/2019   K 4.2 05/01/2019   CL 104 05/01/2019   CREATININE 0.65 05/01/2019   BUN 8 05/01/2019   CO2 31 05/01/2019   TSH 1.92 05/01/2019   HGBA1C 5.6 05/01/2019    Lab Results  Component Value Date   TSH 1.92 05/01/2019   Lab Results  Component Value Date   WBC 4.3 05/01/2019   HGB 13.8 05/01/2019   HCT 41.7 05/01/2019   MCV 93.9 05/01/2019   PLT 244.0 05/01/2019   Lab Results  Component Value Date   NA 140 05/01/2019   K 4.2 05/01/2019   CO2 31 05/01/2019   GLUCOSE 101 (H) 05/01/2019   BUN 8 05/01/2019   CREATININE 0.65 05/01/2019   BILITOT 0.6 05/01/2019   ALKPHOS 33 (L) 05/01/2019   AST 24 05/01/2019   ALT 20 05/01/2019   PROT 6.6 05/01/2019   ALBUMIN 4.2 05/01/2019   CALCIUM 8.9 05/01/2019   GFR 99.55 05/01/2019   Lab Results  Component Value Date   CHOL 139 05/01/2019   Lab Results  Component Value Date   HDL 51.20 05/01/2019   Lab Results  Component Value Date   LDLCALC 80 05/01/2019   Lab Results  Component Value Date   TRIG 37.0 05/01/2019   Lab Results  Component Value Date   CHOLHDL 3 05/01/2019   Lab Results  Component Value Date   HGBA1C 5.6 05/01/2019       Assessment & Plan:   Problem List Items  Addressed This Visit    Preventative health care    Patient encouraged to maintain heart healthy diet, regular exercise, adequate sleep. Consider daily probiotics. Take medications as prescribed. Pap today. MGM due in November 2021.       Relevant Orders   CBC   TSH   Hyperglycemia   Relevant Orders   Hemoglobin A1c   Comprehensive metabolic panel   Dyslipidemia    Encouraged heart healthy diet, increase exercise, avoid trans fats, consider a krill oil cap daily      Relevant Orders   Lipid panel   Attention deficit disorder - Primary    Doing well on Vyvanse prn sometimes 0 sometimes 1 and sometimes 2 tabs depending oon her day and  tolerates it well.       Relevant Orders   DRUG MONITORING, PANEL 8 WITH CONFIRMATION, URINE   Anxiety    Did not tolerate the Buspar felt foggy. Felt much better on Prozac abut had sexual side effects so she stopped it and that went away      Relevant Orders   Ambulatory referral to [place]    Other Visit Diagnoses    High risk medication use       Relevant Orders   DRUG MONITORING, PANEL 8 WITH CONFIRMATION, URINE   Cervical cancer screening       Relevant Orders   Cytology - PAP( [place])      I have discontinued Evette Georges. Herren's ibuprofen and busPIRone. I have also changed her lisdexamfetamine. Additionally, I am having her start on lisdexamfetamine. Lastly, I am having her maintain her Multiple Vitamins-Minerals (MULTIVITAMIN ADULT PO), Magnesium, Vitamin D (Cholecalciferol), Olopatadine HCl, and valACYclovir.  Meds ordered this encounter  Medications  . lisdexamfetamine (VYVANSE) 10 MG capsule    Sig: Take 1-2 capsules (10-20 mg total) by mouth daily. June 2021    Dispense:  45 capsule    Refill:  0  . lisdexamfetamine (VYVANSE) 10 MG capsule    Sig: Take 1-2 capsules (10-20 mg total) by mouth daily. May 2021    Dispense:  45 capsule    Refill:  0     Penni Homans, MD

## 2019-11-05 NOTE — Patient Instructions (Addendum)
Our malady by Sandra Long   For sleep Encouraged good sleep hygiene such as dark, quiet room. No blue/green glowing lights such as computer screens in bedroom. No alcohol or stimulants in evening. Cut down on caffeine as able. Regular exercise is helpful but not just prior to bed time.  Take Magnesium Glycinate 400 mg at bedtime Melatonin 2-5 mg at bedtime  Preventive Care 59-44 Years Old, Female Preventive care refers to visits with your health care provider and lifestyle choices that can promote health and wellness. This includes:  A yearly physical exam. This may also be called an annual well check.  Regular dental visits and eye exams.  Immunizations.  Screening for certain conditions.  Healthy lifestyle choices, such as eating a healthy diet, getting regular exercise, not using drugs or products that contain nicotine and tobacco, and limiting alcohol use. What can I expect for my preventive care visit? Physical exam Your health care provider will check your:  Height and weight. This may be used to calculate body mass index (BMI), which tells if you are at a healthy weight.  Heart rate and blood pressure.  Skin for abnormal spots. Counseling Your health care provider may ask you questions about your:  Alcohol, tobacco, and drug use.  Emotional well-being.  Home and relationship well-being.  Sexual activity.  Eating habits.  Work and work Statistician.  Method of birth control.  Menstrual cycle.  Pregnancy history. What immunizations do I need?  Influenza (flu) vaccine  This is recommended every year. Tetanus, diphtheria, and pertussis (Tdap) vaccine  You may need a Td booster every 10 years. Varicella (chickenpox) vaccine  You may need this if you have not been vaccinated. Human papillomavirus (HPV) vaccine  If recommended by your health care provider, you may need three doses over 6 months. Measles, mumps, and rubella (MMR) vaccine  You may need  at least one dose of MMR. You may also need a second dose. Meningococcal conjugate (MenACWY) vaccine  One dose is recommended if you are age 89-21 years and a first-year college student living in a residence hall, or if you have one of several medical conditions. You may also need additional booster doses. Pneumococcal conjugate (PCV13) vaccine  You may need this if you have certain conditions and were not previously vaccinated. Pneumococcal polysaccharide (PPSV23) vaccine  You may need one or two doses if you smoke cigarettes or if you have certain conditions. Hepatitis A vaccine  You may need this if you have certain conditions or if you travel or work in places where you may be exposed to hepatitis A. Hepatitis B vaccine  You may need this if you have certain conditions or if you travel or work in places where you may be exposed to hepatitis B. Haemophilus influenzae type b (Hib) vaccine  You may need this if you have certain conditions. You may receive vaccines as individual doses or as more than one vaccine together in one shot (combination vaccines). Talk with your health care provider about the risks and benefits of combination vaccines. What tests do I need?  Blood tests  Lipid and cholesterol levels. These may be checked every 5 years starting at age 24.  Hepatitis C test.  Hepatitis B test. Screening  Diabetes screening. This is done by checking your blood sugar (glucose) after you have not eaten for a while (fasting).  Sexually transmitted disease (STD) testing.  BRCA-related cancer screening. This may be done if you have a family history of breast,  ovarian, tubal, or peritoneal cancers.  Pelvic exam and Pap test. This may be done every 3 years starting at age 8. Starting at age 64, this may be done every 5 years if you have a Pap test in combination with an HPV test. Talk with your health care provider about your test results, treatment options, and if necessary,  the need for more tests. Follow these instructions at home: Eating and drinking   Eat a diet that includes fresh fruits and vegetables, whole grains, lean protein, and low-fat dairy.  Take vitamin and mineral supplements as recommended by your health care provider.  Do not drink alcohol if: ? Your health care provider tells you not to drink. ? You are pregnant, may be pregnant, or are planning to become pregnant.  If you drink alcohol: ? Limit how much you have to 0-1 drink a day. ? Be aware of how much alcohol is in your drink. In the [place] one drink equals one 12 oz bottle of beer (355 mL), one 5 oz glass of wine (148 mL), or one 1 oz glass of hard liquor (44 mL). Lifestyle  Take daily care of your teeth and gums.  Stay active. Exercise for at least 30 minutes on 5 or more days each week.  Do not use any products that contain nicotine or tobacco, such as cigarettes, e-cigarettes, and chewing tobacco. If you need help quitting, ask your health care provider.  If you are sexually active, practice safe sex. Use a condom or other form of birth control (contraception) in order to prevent pregnancy and STIs (sexually transmitted infections). If you plan to become pregnant, see your health care provider for a preconception visit. What's next?  Visit your health care provider once a year for a well check visit.  Ask your health care provider how often you should have your eyes and teeth checked.  Stay up to date on all vaccines. This information is not intended to replace advice given to you by your health care provider. Make sure you discuss any questions you have with your health care provider. Document Revised: 02/22/2018 Document Reviewed: 02/22/2018 Elsevier Patient Education  2020 [place]

## 2019-11-06 LAB — DRUG MONITORING, PANEL 8 WITH CONFIRMATION, URINE
6 Acetylmorphine: NEGATIVE ng/mL (ref <10)
Alcohol Metabolites: NEGATIVE ng/mL
Amphetamines: NEGATIVE ng/mL (ref <500)
Benzodiazepines: NEGATIVE ng/mL (ref <100)
Buprenorphine, Urine: NEGATIVE ng/mL (ref <5)
Cocaine Metabolite: NEGATIVE ng/mL (ref <150)
Creatinine: 32.5 mg/dL
MDMA: NEGATIVE ng/mL (ref <500)
Marijuana Metabolite: NEGATIVE ng/mL (ref <20)
Opiates: NEGATIVE ng/mL (ref <100)
Oxidant: NEGATIVE ug/mL
Oxycodone: NEGATIVE ng/mL (ref <100)
pH: 6.8 (ref 4.5–9.0)

## 2019-11-06 LAB — DM TEMPLATE

## 2019-11-06 LAB — CYTOLOGY - PAP: Diagnosis: NEGATIVE

## 2020-01-01 ENCOUNTER — Other Ambulatory Visit: Payer: Self-pay | Admitting: Family Medicine

## 2020-01-01 MED ORDER — LISDEXAMFETAMINE DIMESYLATE 10 MG PO CAPS: 10.0000 mg | ORAL_CAPSULE | Freq: Every day | ORAL | 0 refills | Status: DC

## 2020-01-12 ENCOUNTER — Other Ambulatory Visit: Payer: Self-pay

## 2020-01-12 ENCOUNTER — Ambulatory Visit (HOSPITAL_COMMUNITY)
Admission: EM | Admit: 2020-01-12 | Discharge: 2020-01-12 | Disposition: A | Discharge status: Home / Self Care | Payer: 59 | Attending: Physician Assistant | Admitting: Physician Assistant

## 2020-01-12 ENCOUNTER — Encounter (HOSPITAL_COMMUNITY): Payer: Self-pay

## 2020-01-12 DIAGNOSIS — S70361A Insect bite (nonvenomous), right thigh, initial encounter: Secondary | ICD-10-CM

## 2020-01-12 DIAGNOSIS — W57XXXA Bitten or stung by nonvenomous insect and other nonvenomous arthropods, initial encounter: Secondary | ICD-10-CM | POA: Diagnosis not present

## 2020-01-12 DIAGNOSIS — R21 Rash and other nonspecific skin eruption: Secondary | ICD-10-CM

## 2020-01-12 MED ORDER — HYDROCORTISONE 2.5 % EX LOTN
TOPICAL_LOTION | Freq: Two times a day (BID) | CUTANEOUS | 0 refills | Status: DC
Start: 2020-01-12 — End: 2020-01-12

## 2020-01-12 MED ORDER — HYDROCORTISONE 2.5 % EX LOTN
TOPICAL_LOTION | Freq: Two times a day (BID) | CUTANEOUS | 0 refills | Status: DC
Start: 2020-01-12 — End: 2021-12-23

## 2020-01-12 MED ORDER — DOXYCYCLINE HYCLATE 100 MG PO CAPS
100.0000 mg | ORAL_CAPSULE | Freq: Two times a day (BID) | ORAL | 0 refills | Status: DC
Start: 2020-01-12 — End: 2020-01-20

## 2020-01-12 MED ORDER — DOXYCYCLINE HYCLATE 100 MG PO CAPS
100.0000 mg | ORAL_CAPSULE | Freq: Two times a day (BID) | ORAL | 0 refills | Status: DC
Start: 2020-01-12 — End: 2020-01-12

## 2020-01-12 NOTE — ED Provider Notes (Addendum)
Sandra Long    CSN: [id number] Arrival date & time: 01/12/20  1740      History   Chief Complaint Chief Complaint  Patient presents with  . Insect Bite    HPI Sandra Long is a 44 y.o. female.   Patient presents for rash on right inner thigh. Sandra believes this started Wednesday after a bug bite. It started as red and itchy. It has since grown and had changes in coloration to appear target like. It is now somewhat warm and "tight" per patient. Sandra reports some pain. Sandra does not recall a tick bite. [place] and is outdoors frequently. Sandra reports Sandra has had some bodyache and possible fever over the last few days. No measured temperature. Denies headache. No reported abdominal pain. Denies other rash. No other symptoms reported. Sandra has tried cool and warm compresses .      Past Medical History:  Diagnosis Date  . Anemia 06/07/2016  . Cervical disc disease 11/25/2014   MRI 2014  1. Moderate left central and foraminal stenosis due to a large left lateral recess disc protrusion at C6-7, potentially with a small amount of marginal blood products - correlate with chronicity of onset of patient's symptoms. 2. There is also moderate right eccentric central stenosis at the C5-6 level due to a right paracentral disc protrusion. 3. Chronic ethmoid sinusitis. 4. Mild   . Dog bite 10/20/2016  . Dyslipidemia 06/07/2016  . Eye disease 12/07/2014   Macular rosacea  . Gestational diabetes 2009  . Hyperglycemia 06/07/2016  . Palpitations 11/25/2014  . Paresthesia 11/25/2014  . Preventative health care 12/07/2014    Patient Active Problem List   Diagnosis Date Noted  . Anxiety 05/19/2019  . Attention deficit disorder 01/03/2018  . Feeling grief 01/03/2018  . Right elbow pain 05/29/2017  . Hyperglycemia 06/07/2016  . Anemia 06/07/2016  . Dyslipidemia 06/07/2016  . Preventative health care 12/07/2014  . Eye disease 12/07/2014  . Occasional tremors  12/07/2014  . Family history of anemia 12/07/2014  . Paresthesia 11/25/2014  . Cervical disc disease 11/25/2014  . Palpitations 11/25/2014  . ALLERGIC RHINITIS, SEASONAL 10/28/2008  . TB SKIN TEST, POSITIVE 10/28/2008    Past Surgical History:  Procedure Laterality Date  . CESAREAN SECTION  2007  . CESAREAN SECTION N/A 09/08/2015   Procedure: Repeat CESAREAN SECTION;  Surgeon: Brien Few, MD;  Location: [facility];  Service: Obstetrics;  Laterality: N/A;  EDD: 09/14/15  . WISDOM TOOTH EXTRACTION      OB History    Gravida  4   Para  3   Term  2   Preterm  0   AB  1   Living  1     SAB  1   TAB  0   Ectopic  0   Multiple  0   Live Births  1            Home Medications    Prior to Admission medications   Medication Sig Start Date End Date Taking? Authorizing Provider  lisdexamfetamine (VYVANSE) 10 MG capsule Take 1-2 capsules (10-20 mg total) by mouth daily. May 2021 11/05/19  Yes Mosie Lukes, MD  Multiple Vitamins-Minerals (MULTIVITAMIN ADULT PO) Take 1 tablet by mouth daily.   Yes [provider]  doxycycline (VIBRAMYCIN) 100 MG capsule Take 1 capsule (100 mg total) by mouth 2 (two) times daily. 01/12/20   Lesha Jager, Marguerita Beards, PA-C  hydrocortisone 2.5 % lotion  Apply topically 2 (two) times daily. 01/12/20   Anaston Koehn, Marguerita Beards, PA-C  lisdexamfetamine (VYVANSE) 10 MG capsule Take 1-2 capsules (10-20 mg total) by mouth daily. June 2021 01/01/20   Mosie Lukes, MD  Magnesium 200 MG TABS Take 400 mg by mouth daily.    [provider]  Olopatadine HCl 0.2 % SOLN Use one drop daily. 02/23/17   Shelda Pal, DO  valACYclovir (VALTREX) 1000 MG tablet Take 2 tablets by mouth now and repeat in 12 hours 08/08/19   Mosie Lukes, MD  Vitamin D, Cholecalciferol, 1000 UNITS TABS Take 2,000 Units by mouth daily.    [provider]    Family History Family History  Problem Relation Age of Onset  . Heart disease Mother        2 stents 2015    . Tremor Mother        benign essential  . Colon polyps Mother   . Heart disease Father        stents  . Diabetes Father        91  . Stroke Father   . Gout Father   . Blindness Father   . Gout Brother   . COPD Son   . Cancer Maternal Grandfather        Multiple Myeloma  . Stroke Paternal Grandmother   . Cancer Paternal Grandfather        bladder cancer    Social History Social History   Tobacco Use  . Smoking status: Never Smoker  . Smokeless tobacco: Never Used  Substance Use Topics  . Alcohol use: Yes    Alcohol/week: 0.0 standard drinks  . Drug use: No     Allergies   No known allergies   Review of Systems Review of Systems   Physical Exam Triage Vital Signs ED Triage Vitals [01/12/20 1828]  Enc Vitals Group     BP (!) 118/47     Pulse Rate 88     Resp 18     Temp 98.3 F (36.8 C)     Temp Source Oral     SpO2 100 %     Weight      Height      Head Circumference      Peak Flow      Pain Score 0     Pain Loc      Pain Edu?      Excl. in [place]?    No data found.  Updated Vital Signs BP (!) 118/47 (BP Location: Left Arm)   Pulse 88   Temp 98.3 F (36.8 C) (Oral)   Resp 18   LMP 01/08/2020   SpO2 100%   Visual Acuity Right Eye Distance:   Left Eye Distance:   Bilateral Distance:    Right Eye Near:   Left Eye Near:    Bilateral Near:     Physical Exam Vitals and nursing note reviewed.  Constitutional:      General: Sandra is not in acute distress.    Appearance: Sandra is well-developed. Sandra is not ill-appearing.  HENT:     Head: Normocephalic and atraumatic.  Eyes:     Conjunctiva/sclera: Conjunctivae normal.  Cardiovascular:     Rate and Rhythm: Normal rate.  Pulmonary:     Effort: Pulmonary effort is normal. No respiratory distress.  Musculoskeletal:     Cervical back: Neck supple.  Skin:    General: Skin is warm and dry.  Comments: Approximately 12cm in diameter target rash on right inner thigh. Mildly warm and tender.  Clearly demarcated margins. No significant induration or fluctuance. No other rash.   No other rash on body appreciated  Neurological:     Mental Status: Sandra is alert.          UC Treatments / Results  Labs (all labs ordered are listed, but only abnormal results are displayed) Labs Reviewed - No data to display  EKG   Radiology No results found.  Procedures Procedures (including critical care time)  Medications Ordered in UC Medications - No data to display  Initial Impression / Assessment and Plan / UC Course  I have reviewed the triage vital signs and the nursing notes.  Pertinent labs & imaging results that were available during my care of the patient were reviewed by me and considered in my medical decision making (see chart for details).     #insect bite #Target rash Patient is  44 year old presenting with target rash. Suspicious for erythema migrans. Given subjective malaise, we will start on doxycycline to cover for tick borne, Lyme. Overall Sandra appears well here today. Hydrocortisone lotion for light application to sooth any itch remaining. 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 Return and follow up precautions discussed. Patient verbalized understanding plan of care.  Final Clinical Impressions(s) / UC Diagnoses   Final diagnoses:  Insect bite of right thigh, initial encounter  Target rash     Discharge Instructions     Drink plenty of water with the medicine Apply lotion to area of itch  Follow up with your PCP this week to discuss  Monitor for increase in size, continue to mark      ED Prescriptions    Medication Sig Dispense Auth. Provider   doxycycline (VIBRAMYCIN) 100 MG capsule  (Status: Discontinued) Take 1 capsule (100 mg total) by mouth 2 (two) times daily. 20 capsule Myria Steenbergen, Marguerita Beards, PA-C   hydrocortisone 2.5 % lotion  (Status: Discontinued) Apply topically 2  (two) times daily. 59 mL Loye Vento, Marguerita Beards, PA-C   hydrocortisone 2.5 % lotion Apply topically 2 (two) times daily. 59 mL Lin Hackmann, Marguerita Beards, PA-C   doxycycline (VIBRAMYCIN) 100 MG capsule Take 1 capsule (100 mg total) by mouth 2 (two) times daily. 20 capsule Chip Canepa, Marguerita Beards, PA-C     PDMP not reviewed this encounter.   Purnell Shoemaker, PA-C 01/12/20 2122    Shamika Pedregon, Marguerita Beards, PA-C 01/13/20 1313

## 2020-01-12 NOTE — ED Triage Notes (Signed)
Pt c/o "insect bite" to right leg since Wednesday. Pt states she feels site looks "worse" today. Right posterior thigh/hamstring area with center erythema, white circular ring, then erythema ring around area.  Pt states she has been taking zyrtec. Denies pain/itching, but states it feels "tight" in the local area.  Denies joint pain, fever, chills.

## 2020-01-12 NOTE — Discharge Instructions (Addendum)
Drink plenty of water with the medicine Apply lotion to area of itch  Follow up with your PCP this week to discuss  Monitor for increase in size, continue to mark

## 2020-01-13 ENCOUNTER — Telehealth: Payer: Self-pay | Admitting: Family Medicine

## 2020-01-13 NOTE — Telephone Encounter (Signed)
Did she just see UC and start the antibiotics or has she been on them for awhile and it is still getting hotter and redder? If just on take for a couple days along with Cetirizine 10 mg twice a day (if not already taking an antihistamine) and Famotidine 20 mg po twice a day for next 2 weeks. Cleanse area with Witch Hazel astringent. If it is worsening despite antibiotics then she needs to be evaluated

## 2020-01-13 NOTE — Telephone Encounter (Signed)
Do you want her to get in to see another provider?

## 2020-01-13 NOTE — Telephone Encounter (Signed)
Patient states Bee sting and hot to touch, patient went to urgent care and they gave antibiotic.  Please advise

## 2020-01-14 NOTE — Telephone Encounter (Signed)
LM requesting call back to discuss symptoms/treatment.

## 2020-01-15 NOTE — Telephone Encounter (Signed)
Left message on machine to call back  

## 2020-01-17 NOTE — Telephone Encounter (Signed)
Patient called back.  She stated that bite is still read with some boarders.  She started doxy on 7/18 and is currently still taking.  She is also taking Zytec and Zantac.  She had a few questions.  She did make an appt for Tuesday with Percell Miller if you feel that she still needs to be seen.    1.  She wants to make sure that the antibiotic, doxy, is on board for what it is needed for which she was put on on for 10 days or does she need to be on this for another month?  2. Does from looking at picture in 7/18 visit at urgent care- in epic, do you think it could be something else other than lyme, or a different type of insect?  3.  When would you recommend blood test for lyme?

## 2020-01-19 NOTE — Telephone Encounter (Signed)
Rash looks like it could be Lyme but it could be a secondary infection from another but bite. Doxycycline is a good choice either way. We can check for Lyme at 3-4 weeks from bite. Very rarely use the extended Doxycycline unless there are obvious persistne symptoms. If she is worried we can do a virtual visit to discuss further.

## 2020-01-20 ENCOUNTER — Other Ambulatory Visit: Payer: Self-pay | Admitting: Family Medicine

## 2020-01-20 DIAGNOSIS — W57XXXD Bitten or stung by nonvenomous insect and other nonvenomous arthropods, subsequent encounter: Secondary | ICD-10-CM

## 2020-01-20 MED ORDER — DOXYCYCLINE HYCLATE 100 MG PO CAPS
100.0000 mg | ORAL_CAPSULE | Freq: Two times a day (BID) | ORAL | 0 refills | Status: DC
Start: 2020-01-20 — End: 2020-02-12

## 2020-01-20 NOTE — Telephone Encounter (Signed)
She can skip the appt tomorrow if she is doing better. I sent in the Doxycycline for four more days. I will order labs for 2 weeks from now if you can set her up with an appt

## 2020-01-20 NOTE — Telephone Encounter (Signed)
Patient agreed with plan.  Lab appt schedule.  Appt with PA cancelled since bite is better.

## 2020-01-20 NOTE — Telephone Encounter (Signed)
Sorry about this spoke with patient about your note below.  She would like to know if you would recommend her doxy another 4 days.  She is not having any symptoms and the bite per patient looks so much better.  Also do you recommend she should see Percell Miller tomorrow to evaluate?  If not then would we be able to put in lab orders for her to come in to check in in about 2 weeks?

## 2020-01-21 ENCOUNTER — Ambulatory Visit: Payer: 59 | Admitting: Medical

## 2020-02-03 ENCOUNTER — Other Ambulatory Visit: Payer: Self-pay | Admitting: Family Medicine

## 2020-02-03 MED ORDER — LISDEXAMFETAMINE DIMESYLATE 10 MG PO CAPS: 10.0000 mg | ORAL_CAPSULE | Freq: Every day | ORAL | 0 refills | Status: DC

## 2020-02-03 NOTE — Telephone Encounter (Signed)
Requesting: vyvanse Contract:11/14/19 UDS:11/05/19 Last Visit:11/05/19 Next Visit:8/12 Last Refill:01/01/20  Please Advise

## 2020-02-06 ENCOUNTER — Other Ambulatory Visit: Payer: 59

## 2020-02-07 ENCOUNTER — Other Ambulatory Visit: Payer: Self-pay

## 2020-02-07 ENCOUNTER — Other Ambulatory Visit (INDEPENDENT_AMBULATORY_CARE_PROVIDER_SITE_OTHER): Payer: 59

## 2020-02-07 DIAGNOSIS — W57XXXD Bitten or stung by nonvenomous insect and other nonvenomous arthropods, subsequent encounter: Secondary | ICD-10-CM | POA: Diagnosis not present

## 2020-02-07 DIAGNOSIS — S70361D Insect bite (nonvenomous), right thigh, subsequent encounter: Secondary | ICD-10-CM

## 2020-02-10 ENCOUNTER — Telehealth: Payer: Self-pay | Admitting: Family Medicine

## 2020-02-10 NOTE — Telephone Encounter (Signed)
Patient states that she needs lab results, please

## 2020-02-11 NOTE — Telephone Encounter (Signed)
Seen by patient Sandra Long on 02/11/2020  7:29 AM  Results viewed by Pt.

## 2020-02-12 ENCOUNTER — Other Ambulatory Visit: Payer: Self-pay | Admitting: Family Medicine

## 2020-02-12 ENCOUNTER — Encounter: Payer: Self-pay | Admitting: Family Medicine

## 2020-02-12 ENCOUNTER — Telehealth: Payer: Self-pay | Admitting: Family Medicine

## 2020-02-12 MED ORDER — DOXYCYCLINE HYCLATE 100 MG PO CAPS: 100.0000 mg | ORAL_CAPSULE | Freq: Two times a day (BID) | ORAL | 0 refills | Status: DC

## 2020-02-12 NOTE — Telephone Encounter (Signed)
Patient wants to know if she can get back on an antibiotic since she was only prescribed a 10 day dose, and her leg still hurts and it has a dull ache  where the bulls eye was and she feels pins and needles in her toes or foot( right ) but no bulls eye appearance is there anymore.  Wants doxy for a month so she doesn't have any complications with her leg .

## 2020-02-12 NOTE — Telephone Encounter (Signed)
Caller: Kashayla Call back # [phone]  Patient states she still has some questions in regards to her Lyme results and would like to speak with someone.

## 2020-02-12 NOTE — Telephone Encounter (Signed)
I had sent in 4 more days of the Doxycycline already but I guess she never picked it up. I have honored her request for the 30 days please let her know to make sure the pharmacy gives her the right prescription.

## 2020-02-13 NOTE — Telephone Encounter (Signed)
Pt.notified

## 2020-02-14 LAB — LYME AB SCREEN %: Lyme AB Screen: 1.08 index — ABNORMAL HIGH

## 2020-02-14 LAB — B. BURGDORFI ANTIBODIES BY WB

## 2020-02-14 LAB — EHRLICHIA ANTIBODY PANEL
E. CHAFFEENSIS AB IGG: <1:64 {titer}
E. CHAFFEENSIS AB IGM: <1:20 {titer}

## 2020-02-14 LAB — ROCKY MTN SPOTTED FVR ABS PNL(IGG+IGM)
RMSF IgG: NOT DETECTED
RMSF IgM: NOT DETECTED

## 2020-03-03 ENCOUNTER — Telehealth (INDEPENDENT_AMBULATORY_CARE_PROVIDER_SITE_OTHER): Payer: 59 | Admitting: Family Medicine

## 2020-03-03 ENCOUNTER — Other Ambulatory Visit: Payer: Self-pay

## 2020-03-03 DIAGNOSIS — A6920 Lyme disease, unspecified: Secondary | ICD-10-CM

## 2020-03-03 NOTE — Progress Notes (Signed)
Virtual Visit via Video Note  I connected with Sandra Long on 03/03/20 at  3:00 PM EDT by a video enabled telemedicine application and verified that I am speaking with the correct person using two identifiers.  Location: Patient: care, patient and provider were in visit Provider: home   I discussed the limitations of evaluation and management by telemedicine and the availability of in person appointments. The patient expressed understanding and agreed to proceed. Sheketia Richardson, CMA was able to set the patient up on a video visit    Subjective:    Patient ID: Sandra Long, female    DOB: 05/20/1976, 44 y.o.   MRN: [id number]  Chief Complaint  Patient presents with  . Follow-up    HPI Patient is in today for follow up on her recent Lyme Disease. She feels well today. No recent febrile illness or hospitalizations. She denies any concerning symptoms. Her bull's eye rash has resolved. Denies CP/palp/SOB/HA/congestion/fevers/GI or GU c/o. Taking meds as prescribed. She tolerated her 10 day course of Doxycycline but did not take the extended course of Doxycycline. Denies CP/palp/SOB/HA/congestion/fevers/GI or GU c/o. Taking meds as prescribed  Past Medical History:  Diagnosis Date  . Anemia 06/07/2016  . Cervical disc disease 11/25/2014   MRI 2014  1. Moderate left central and foraminal stenosis due to a large left lateral recess disc protrusion at C6-7, potentially with a small amount of marginal blood products - correlate with chronicity of onset of patient's symptoms. 2. There is also moderate right eccentric central stenosis at the C5-6 level due to a right paracentral disc protrusion. 3. Chronic ethmoid sinusitis. 4. Mild   . Dog bite 10/20/2016  . Dyslipidemia 06/07/2016  . Eye disease 12/07/2014   Macular rosacea  . Gestational diabetes 2009  . Hyperglycemia 06/07/2016  . Palpitations 11/25/2014  . Paresthesia 11/25/2014  . Preventative health care 12/07/2014    Past  Surgical History:  Procedure Laterality Date  . CESAREAN SECTION  2007  . CESAREAN SECTION N/A 09/08/2015   Procedure: Repeat CESAREAN SECTION;  Surgeon: Richard Taavon, MD;  Location: [facility];  Service: Obstetrics;  Laterality: N/A;  EDD: 09/14/15  . WISDOM TOOTH EXTRACTION      Family History  Problem Relation Age of Onset  . Heart disease Mother        2 stents 2015  . Tremor Mother        benign essential  . Colon polyps Mother   . Heart disease Father        stents  . Diabetes Father        2015  . Stroke Father   . Gout Father   . Blindness Father   . Gout Brother   . COPD Son   . Cancer Maternal Grandfather        Multiple Myeloma  . Stroke Paternal Grandmother   . Cancer Paternal Grandfather        bladder cancer    Social History   Socioeconomic History  . Marital status: Married    Spouse name: Not on file  . Number of children: Not on file  . Years of education: Not on file  . Highest education level: Not on file  Occupational History  . Occupation: RN [employer]  Tobacco Use  . Smoking status: Never Smoker  . Smokeless tobacco: Never Used  Substance and Sexual Activity  . Alcohol use: Yes    Alcohol/week: 0.0 standard drinks  . Drug   use: No  . Sexual activity: Yes    Comment: lives with husband, no red meat, works as RN at [employer]  Other Topics Concern  . Not on file  Social History Narrative  . Not on file   Social Determinants of Health   Financial Resource Strain:   . Difficulty of Paying Living Expenses: Not on file  Food Insecurity:   . Worried About Running Out of Food in the Last Year: Not on file  . Ran Out of Food in the Last Year: Not on file  Transportation Needs:   . Lack of Transportation (Medical): Not on file  . Lack of Transportation (Non-Medical): Not on file  Physical Activity:   . Days of Exercise per Week: Not on file  . Minutes of Exercise per Session: Not on file  Stress:   . Feeling  of Stress : Not on file  Social Connections:   . Frequency of Communication with Friends and Family: Not on file  . Frequency of Social Gatherings with Friends and Family: Not on file  . Attends Religious Services: Not on file  . Active Member of Clubs or Organizations: Not on file  . Attends Club or Organization Meetings: Not on file  . Marital Status: Not on file  Intimate Partner Violence:   . Fear of Current or Ex-Partner: Not on file  . Emotionally Abused: Not on file  . Physically Abused: Not on file  . Sexually Abused: Not on file    Outpatient Medications Prior to Visit  Medication Sig Dispense Refill  . hydrocortisone 2.5 % lotion Apply topically 2 (two) times daily. 59 mL 0  . lisdexamfetamine (VYVANSE) 10 MG capsule Take 1-2 capsules (10-20 mg total) by mouth daily. May 2021 45 capsule 0  . lisdexamfetamine (VYVANSE) 10 MG capsule Take 1-2 capsules (10-20 mg total) by mouth daily. August 2021 45 capsule 0  . Magnesium 200 MG TABS Take 400 mg by mouth daily.    . Multiple Vitamins-Minerals (MULTIVITAMIN ADULT PO) Take 1 tablet by mouth daily.    . Olopatadine HCl 0.2 % SOLN Use one drop daily. 1 Bottle 0  . valACYclovir (VALTREX) 1000 MG tablet Take 2 tablets by mouth now and repeat in 12 hours 4 tablet 2  . Vitamin D, Cholecalciferol, 1000 UNITS TABS Take 2,000 Units by mouth daily.    . doxycycline (VIBRAMYCIN) 100 MG capsule Take 1 capsule (100 mg total) by mouth 2 (two) times daily. 60 capsule 0   No facility-administered medications prior to visit.    Allergies  Allergen Reactions  . No Known Allergies     Review of Systems  Constitutional: Negative for fever and malaise/fatigue.  HENT: Negative for congestion.   Eyes: Negative for blurred vision.  Respiratory: Negative for shortness of breath.   Cardiovascular: Negative for chest pain, palpitations and leg swelling.  Gastrointestinal: Negative for abdominal pain, blood in stool and nausea.  Genitourinary:  Negative for dysuria and frequency.  Musculoskeletal: Negative for falls.  Skin: Negative for rash.  Neurological: Negative for dizziness, loss of consciousness and headaches.  Endo/Heme/Allergies: Negative for environmental allergies.  Psychiatric/Behavioral: Negative for depression. The patient is not nervous/anxious.        Objective:    Physical Exam Constitutional:      Appearance: Normal appearance. She is not ill-appearing.  HENT:     Head: Normocephalic and atraumatic.     Right Ear: External ear normal.     Left Ear: External ear normal.    Eyes:     General:        Right eye: No discharge.        Left eye: No discharge.  Pulmonary:     Effort: Pulmonary effort is normal.  Neurological:     Mental Status: She is alert and oriented to person, place, and time.  Psychiatric:        Behavior: Behavior normal.     There were no vitals taken for this visit. Wt Readings from Last 3 Encounters:  11/05/19 117 lb 12.8 oz (53.4 kg)  07/05/19 120 lb (54.4 kg)  05/16/19 120 lb (54.4 kg)    Diabetic Foot Exam - Simple   No data filed     Lab Results  Component Value Date   WBC 4.6 11/05/2019   HGB 13.2 11/05/2019   HCT 39.2 11/05/2019   PLT 199.0 11/05/2019   GLUCOSE 111 (H) 11/05/2019   CHOL 133 11/05/2019   TRIG 54.0 11/05/2019   HDL 53.00 11/05/2019   LDLCALC 70 11/05/2019   ALT 17 11/05/2019   AST 21 11/05/2019   NA 139 11/05/2019   K 4.5 11/05/2019   CL 103 11/05/2019   CREATININE 0.50 11/05/2019   BUN 7 11/05/2019   CO2 28 11/05/2019   TSH 3.76 11/05/2019   HGBA1C 5.6 11/05/2019    Lab Results  Component Value Date   TSH 3.76 11/05/2019   Lab Results  Component Value Date   WBC 4.6 11/05/2019   HGB 13.2 11/05/2019   HCT 39.2 11/05/2019   MCV 93.6 11/05/2019   PLT 199.0 11/05/2019   Lab Results  Component Value Date   NA 139 11/05/2019   K 4.5 11/05/2019   CO2 28 11/05/2019   GLUCOSE 111 (H) 11/05/2019   BUN 7 11/05/2019   CREATININE  0.50 11/05/2019   BILITOT 0.6 11/05/2019   ALKPHOS 31 (L) 11/05/2019   AST 21 11/05/2019   ALT 17 11/05/2019   PROT 6.5 11/05/2019   ALBUMIN 4.3 11/05/2019   CALCIUM 9.3 11/05/2019   GFR 134.43 11/05/2019   Lab Results  Component Value Date   CHOL 133 11/05/2019   Lab Results  Component Value Date   HDL 53.00 11/05/2019   Lab Results  Component Value Date   LDLCALC 70 11/05/2019   Lab Results  Component Value Date   TRIG 54.0 11/05/2019   Lab Results  Component Value Date   CHOLHDL 3 11/05/2019   Lab Results  Component Value Date   HGBA1C 5.6 11/05/2019       Assessment & Plan:   Problem List Items Addressed This Visit    Lyme disease    She is feeling well and her rash has resolved. She tolerated the 10 day course of Lyme disease. She has no concerning persistent symptoms. She has decided after discussion not to take an extended course of Doxycycline. She will continue probiotics and if symptoms develop she will let us know.          I have discontinued Evette Georges. Brunelli's doxycycline. I am also having her maintain her Multiple Vitamins-Minerals (MULTIVITAMIN ADULT PO), Magnesium, Vitamin D (Cholecalciferol), Olopatadine HCl, valACYclovir, lisdexamfetamine, hydrocortisone, and lisdexamfetamine.  No orders of the defined types were placed in this encounter.   I discussed the assessment and treatment plan with the patient. The patient was provided an opportunity to ask questions and all were answered. The patient agreed with the plan and demonstrated an understanding of the instructions.   The patient  was advised to call back or seek an in-person evaluation if the symptoms worsen or if the condition fails to improve as anticipated.  I provided 10 minutes of non-face-to-face time during this encounter.    , MD  

## 2020-03-03 NOTE — Assessment & Plan Note (Signed)
She is feeling well and her rash has resolved. She tolerated the 10 day course of Lyme disease. She has no concerning persistent symptoms. She has decided after discussion not to take an extended course of Doxycycline. She will continue probiotics and if symptoms develop she will let us know.

## 2020-03-17 ENCOUNTER — Other Ambulatory Visit: Payer: Self-pay | Admitting: Family Medicine

## 2020-03-17 MED ORDER — LISDEXAMFETAMINE DIMESYLATE 10 MG PO CAPS: 10.0000 mg | ORAL_CAPSULE | Freq: Every day | ORAL | 0 refills | Status: DC

## 2020-03-17 NOTE — Telephone Encounter (Signed)
Requesting: vyvanse Contract:11/14/19 UDS:n/a Last Visit:11/05/19 Next Visit:n/a Last Refill:11/05/19  Please Advise

## 2020-05-06 ENCOUNTER — Other Ambulatory Visit: Payer: Self-pay | Admitting: Family Medicine

## 2020-05-06 ENCOUNTER — Encounter: Payer: Self-pay | Admitting: Family Medicine

## 2020-05-06 MED ORDER — VALACYCLOVIR HCL 1 G PO TABS
ORAL_TABLET | ORAL | 2 refills | Status: DC
Start: 2020-05-06 — End: 2020-07-15

## 2020-05-06 MED ORDER — LISDEXAMFETAMINE DIMESYLATE 10 MG PO CAPS
10.0000 mg | ORAL_CAPSULE | Freq: Every day | ORAL | 0 refills | Status: DC
Start: 2020-05-06 — End: 2020-07-15

## 2020-05-06 MED ORDER — LISDEXAMFETAMINE DIMESYLATE 10 MG PO CAPS: 10.0000 mg | ORAL_CAPSULE | Freq: Every day | ORAL | 0 refills | Status: DC

## 2020-05-06 NOTE — Telephone Encounter (Signed)
duplicate

## 2020-05-06 NOTE — Telephone Encounter (Signed)
Requesting:Vyvanse Contract:11/05/2019 UDS:11/05/2019 Last Visit:03/03/2020 Next Visit:none scheduled Last Refill:03/17/2020  Please Advise

## 2020-05-06 NOTE — Telephone Encounter (Signed)
Requesting: Vyvanse 10mg  Contract: 11/05/2019 UDS: 11/05/2019 Last Visit: 11/05/2019 Next Visit: None scheduled Last Refill: 03/17/2020 #45 and 0RF Pt sig: 1-2 capsules by mouth daily  Please Advise

## 2020-06-10 ENCOUNTER — Other Ambulatory Visit: Payer: Self-pay | Admitting: Family Medicine

## 2020-06-10 DIAGNOSIS — Z1231 Encounter for screening mammogram for malignant neoplasm of breast: Secondary | ICD-10-CM

## 2020-07-15 ENCOUNTER — Encounter: Payer: Self-pay | Admitting: Family Medicine

## 2020-07-15 ENCOUNTER — Other Ambulatory Visit: Payer: Self-pay | Admitting: Family Medicine

## 2020-07-15 MED ORDER — LISDEXAMFETAMINE DIMESYLATE 10 MG PO CAPS
10.0000 mg | ORAL_CAPSULE | Freq: Every day | ORAL | 0 refills | Status: DC
Start: 2020-07-15 — End: 2020-12-04

## 2020-07-15 MED ORDER — VALACYCLOVIR HCL 1 G PO TABS
ORAL_TABLET | ORAL | 2 refills | Status: DC
Start: 2020-07-15 — End: 2021-09-03

## 2020-07-15 NOTE — Telephone Encounter (Signed)
Requesting: Vyvanse 10mg  Contract: 11/05/2019 UDS: 11/05/2019 Last Visit: 03/03/2020 Next Visit: None Last Refill: 05/06/2020 #45 and 0RF (For November and December 2021)  Please Advise

## 2020-12-04 ENCOUNTER — Other Ambulatory Visit: Payer: Self-pay | Admitting: Family Medicine

## 2020-12-04 ENCOUNTER — Encounter: Payer: Self-pay | Admitting: Family Medicine

## 2020-12-04 MED ORDER — AMPHETAMINE-DEXTROAMPHET ER 20 MG PO CP24
20.0000 mg | ORAL_CAPSULE | Freq: Every day | ORAL | 0 refills | Status: DC
Start: 2020-12-04 — End: 2020-12-21

## 2020-12-21 ENCOUNTER — Other Ambulatory Visit: Payer: Self-pay

## 2020-12-21 ENCOUNTER — Telehealth: Payer: No Typology Code available for payment source | Admitting: Family Medicine

## 2020-12-21 DIAGNOSIS — F988 Other specified behavioral and emotional disorders with onset usually occurring in childhood and adolescence: Secondary | ICD-10-CM | POA: Diagnosis not present

## 2020-12-21 DIAGNOSIS — U071 COVID-19: Secondary | ICD-10-CM | POA: Diagnosis not present

## 2020-12-21 MED ORDER — LISDEXAMFETAMINE DIMESYLATE 20 MG PO CAPS: 20.0000 mg | ORAL_CAPSULE | Freq: Every day | ORAL | 0 refills | Status: DC

## 2020-12-21 NOTE — Progress Notes (Signed)
MyChart Video Visit    Virtual Visit via Video Note   This visit type was conducted due to national recommendations for restrictions regarding the COVID-19 Pandemic (e.g. social distancing) in an effort to limit this patient's exposure and mitigate transmission in our community. This patient is at least at moderate risk for complications without adequate follow up. This format is felt to be most appropriate for this patient at this time. Physical exam was limited by quality of the video and audio technology used for the visit. Nena Alexander, CMA was able to get the patient set up on a video visit.  Patient location: home Patient and provider in visit Provider location: Office  I discussed the limitations of evaluation and management by telemedicine and the availability of in person appointments. The patient expressed understanding and agreed to proceed.  Visit Date: 12/21/2020  Today's healthcare provider: Penni Homans, MD     Subjective:    Patient ID: Sandra Long, female    DOB: August 18, 1975, 45 y.o.   MRN: [id number]  Chief Complaint  Patient presents with   positive covid test   adhd follow up    HPI Patient is in today for evaluation of ADD and newly diagnosed with COVID. She was on vacation last week and was water skiing. She was very sore with muscle soreness yesterday which she thought was caused by the skiing but then she found out she was exposed to [place] so she tested today and was positive. She endorses myalgias and fatigue but no fevers or severe symptoms. She notes Vyvanse 20 mg works better than 10. She is interested in trying this again over Adderall. Denies CP/palp/SOB/HA/congestion/fevers/GI or GU c/o. Taking meds as prescribed   Past Medical History:  Diagnosis Date   Anemia 06/07/2016   Cervical disc disease 11/25/2014   MRI 2014  1.  Moderate left central and foraminal stenosis due to a large left lateral recess disc protrusion at C6-7, potentially with a  small amount of marginal blood products - correlate with chronicity of onset of patient's symptoms. 2.  There is also moderate right eccentric central stenosis at the C5-6 level due to a right paracentral disc protrusion. 3.  Chronic ethmoid sinusitis. 4.  Mild    Dog bite 10/20/2016   Dyslipidemia 06/07/2016   Eye disease 12/07/2014   Macular rosacea   Gestational diabetes 2009   Hyperglycemia 06/07/2016   Palpitations 11/25/2014   Paresthesia 11/25/2014   Preventative health care 12/07/2014    Past Surgical History:  Procedure Laterality Date   CESAREAN SECTION  2007   CESAREAN SECTION N/A 09/08/2015   Procedure: Repeat CESAREAN SECTION;  Surgeon: Brien Few, MD;  Location: [facility];  Service: Obstetrics;  Laterality: N/A;  EDD: 09/14/15   WISDOM TOOTH EXTRACTION      Family History  Problem Relation Age of Onset   Heart disease Mother        2 stents 2015   Tremor Mother        benign essential   Colon polyps Mother    Heart disease Father        stents   Diabetes Father        42   Stroke Father    Gout Father    Blindness Father    Gout Brother    COPD Son    Cancer Maternal Grandfather        Multiple Myeloma   Stroke Paternal Grandmother    Cancer Paternal Grandfather  bladder cancer    Social History   Socioeconomic History   Marital status: Married    Spouse name: Not on file   Number of children: Not on file   Years of education: Not on file   Highest education level: Not on file  Occupational History   Occupation: RN [employer]  Tobacco Use   Smoking status: Never   Smokeless tobacco: Never  Substance and Sexual Activity   Alcohol use: Yes    Alcohol/week: 0.0 standard drinks   Drug use: No   Sexual activity: Yes    Comment: lives with husband, no red meat, works as Therapist, sports at [employer]  Other Topics Concern   Not on file  Social History Narrative   Not on file   Social Determinants of Health   Financial  Resource Strain: Not on file  Food Insecurity: Not on file  Transportation Needs: Not on file  Physical Activity: Not on file  Stress: Not on file  Social Connections: Not on file  Intimate Partner Violence: Not on file    Outpatient Medications Prior to Visit  Medication Sig Dispense Refill   hydrocortisone 2.5 % lotion Apply topically 2 (two) times daily. 59 mL 0   Magnesium 200 MG TABS Take 400 mg by mouth daily.     Multiple Vitamins-Minerals (MULTIVITAMIN ADULT PO) Take 1 tablet by mouth daily.     Olopatadine HCl 0.2 % SOLN Use one drop daily. 1 Bottle 0   valACYclovir (VALTREX) 1000 MG tablet Take 2 tablets by mouth now and repeat in 12 hours 4 tablet 2   Vitamin D, Cholecalciferol, 1000 UNITS TABS Take 2,000 Units by mouth daily.     amphetamine-dextroamphetamine (ADDERALL XR) 20 MG 24 hr capsule Take 1 capsule (20 mg total) by mouth daily. June 2022 30 capsule 0   No facility-administered medications prior to visit.    Allergies  Allergen Reactions   No Known Allergies     Review of Systems  Constitutional:  Positive for malaise/fatigue. Negative for fever.  HENT:  Negative for congestion.   Eyes:  Negative for blurred vision.  Respiratory:  Negative for shortness of breath.   Cardiovascular:  Negative for chest pain, palpitations and leg swelling.  Gastrointestinal:  Negative for abdominal pain, blood in stool and nausea.  Genitourinary:  Negative for dysuria and frequency.  Musculoskeletal:  Positive for myalgias. Negative for falls.  Skin:  Negative for rash.  Neurological:  Negative for dizziness, loss of consciousness and headaches.  Endo/Heme/Allergies:  Negative for environmental allergies.  Psychiatric/Behavioral:  Negative for depression. The patient is not nervous/anxious.       Objective:    Physical Exam Constitutional:      General: She is not in acute distress.    Appearance: Normal appearance. She is not ill-appearing or toxic-appearing.  HENT:      Head: Normocephalic and atraumatic.     Right Ear: External ear normal.     Left Ear: External ear normal.     Nose: Nose normal.  Eyes:     General:        Right eye: No discharge.        Left eye: No discharge.  Pulmonary:     Effort: Pulmonary effort is normal.  Skin:    Findings: No rash.  Neurological:     Mental Status: She is alert and oriented to person, place, and time.  Psychiatric:        Behavior:  Behavior normal.    Pulse 64   Temp 98.6 F (37 C)  Wt Readings from Last 3 Encounters:  11/05/19 117 lb 12.8 oz (53.4 kg)  07/05/19 120 lb (54.4 kg)  05/16/19 120 lb (54.4 kg)    Diabetic Foot Exam - Simple   No data filed    Lab Results  Component Value Date   WBC 4.6 11/05/2019   HGB 13.2 11/05/2019   HCT 39.2 11/05/2019   PLT 199.0 11/05/2019   GLUCOSE 111 (H) 11/05/2019   CHOL 133 11/05/2019   TRIG 54.0 11/05/2019   HDL 53.00 11/05/2019   LDLCALC 70 11/05/2019   ALT 17 11/05/2019   AST 21 11/05/2019   NA 139 11/05/2019   K 4.5 11/05/2019   CL 103 11/05/2019   CREATININE 0.50 11/05/2019   BUN 7 11/05/2019   CO2 28 11/05/2019   TSH 3.76 11/05/2019   HGBA1C 5.6 11/05/2019    Lab Results  Component Value Date   TSH 3.76 11/05/2019   Lab Results  Component Value Date   WBC 4.6 11/05/2019   HGB 13.2 11/05/2019   HCT 39.2 11/05/2019   MCV 93.6 11/05/2019   PLT 199.0 11/05/2019   Lab Results  Component Value Date   NA 139 11/05/2019   K 4.5 11/05/2019   CO2 28 11/05/2019   GLUCOSE 111 (H) 11/05/2019   BUN 7 11/05/2019   CREATININE 0.50 11/05/2019   BILITOT 0.6 11/05/2019   ALKPHOS 31 (L) 11/05/2019   AST 21 11/05/2019   ALT 17 11/05/2019   PROT 6.5 11/05/2019   ALBUMIN 4.3 11/05/2019   CALCIUM 9.3 11/05/2019   GFR 134.43 11/05/2019   Lab Results  Component Value Date   CHOL 133 11/05/2019   Lab Results  Component Value Date   HDL 53.00 11/05/2019   Lab Results  Component Value Date   LDLCALC 70 11/05/2019   Lab  Results  Component Value Date   TRIG 54.0 11/05/2019   Lab Results  Component Value Date   CHOLHDL 3 11/05/2019   Lab Results  Component Value Date   HGBA1C 5.6 11/05/2019       Assessment & Plan:   Problem List Items Addressed This Visit     Attention deficit disorder    Vyvanse 20 mg better than 10 mg and hoping to afford it with coupon will prescribe and reevaluate       COVID-19    Symptoms began yesterday. Tested positive today. Notes mostly myalgias and fatigue. No significant risk factors for severe disease and she has had 3 immunizations. 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 Pulse oximeter, want oxygen in 90s  Take Multivitamin with minerals, selenium Vitamin D 1000-2000 IU daily Probiotic with lactobacillus and bifidophilus Asprin EC 81 mg daily Fish oil Melatonin 5 mg at bedtime         I have discontinued Evette Georges [place]'s amphetamine-dextroamphetamine. I am also having her start on lisdexamfetamine. Additionally, I am having her maintain her Multiple Vitamins-Minerals (MULTIVITAMIN ADULT PO), Magnesium, Vitamin D (Cholecalciferol), Olopatadine HCl, hydrocortisone, and valACYclovir.  Meds ordered this encounter  Medications   lisdexamfetamine (VYVANSE) 20 MG capsule    Sig: Take 1 capsule (20 mg total) by mouth daily. July 2022    Dispense:  30 capsule    Refill:  0    I discussed the assessment and treatment plan with the patient. The patient was provided  an opportunity to ask questions and all were answered. The patient agreed with the plan and demonstrated an understanding of the instructions.   The patient was advised to call back or seek an in-person evaluation if the symptoms worsen or if the condition fails to improve as anticipated.  I provided 15 minutes of face-to-face time during this encounter.   Penni Homans, MD [facility] at [facility] [phone] (phone) [phone] (fax)  [place]

## 2020-12-21 NOTE — Assessment & Plan Note (Signed)
Vyvanse 20 mg better than 10 mg and hoping to afford it with coupon will prescribe and reevaluate

## 2020-12-21 NOTE — Assessment & Plan Note (Signed)
Symptoms began yesterday. Tested positive today. Notes mostly myalgias and fatigue. No significant risk factors for severe disease and she has had 3 immunizations. 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 Pulse oximeter, want oxygen in 90s  Take Multivitamin with minerals, selenium Vitamin D 1000-2000 IU daily Probiotic with lactobacillus and bifidophilus Asprin EC 81 mg daily Fish oil Melatonin 5 mg at bedtime

## 2021-01-25 ENCOUNTER — Encounter: Payer: Self-pay | Admitting: Family Medicine

## 2021-01-27 ENCOUNTER — Other Ambulatory Visit (HOSPITAL_BASED_OUTPATIENT_CLINIC_OR_DEPARTMENT_OTHER): Payer: Self-pay

## 2021-01-27 ENCOUNTER — Other Ambulatory Visit: Payer: Self-pay | Admitting: Family Medicine

## 2021-01-27 MED ORDER — LISDEXAMFETAMINE DIMESYLATE 20 MG PO CAPS
20.0000 mg | ORAL_CAPSULE | Freq: Every day | ORAL | 0 refills | Status: DC
Start: 2021-01-27 — End: 2021-02-11
  Filled 2021-01-27: qty 30, 30d supply, fill #0

## 2021-02-01 ENCOUNTER — Other Ambulatory Visit (HOSPITAL_BASED_OUTPATIENT_CLINIC_OR_DEPARTMENT_OTHER): Payer: Self-pay

## 2021-02-11 ENCOUNTER — Encounter: Payer: Self-pay | Admitting: Family Medicine

## 2021-02-11 ENCOUNTER — Other Ambulatory Visit: Payer: Self-pay | Admitting: Family Medicine

## 2021-02-12 ENCOUNTER — Other Ambulatory Visit: Payer: Self-pay | Admitting: Family Medicine

## 2021-02-12 MED ORDER — LISDEXAMFETAMINE DIMESYLATE 30 MG PO CAPS: 30.0000 mg | ORAL_CAPSULE | Freq: Every day | ORAL | 0 refills | Status: DC

## 2021-02-17 ENCOUNTER — Telehealth: Payer: Self-pay

## 2021-02-17 NOTE — Telephone Encounter (Signed)
Called pt to schedule f/u for 4-8 weeks

## 2021-03-18 ENCOUNTER — Encounter: Payer: Self-pay | Admitting: Family Medicine

## 2021-03-18 ENCOUNTER — Other Ambulatory Visit: Payer: Self-pay

## 2021-03-18 ENCOUNTER — Ambulatory Visit (INDEPENDENT_AMBULATORY_CARE_PROVIDER_SITE_OTHER): Payer: No Typology Code available for payment source | Admitting: Family Medicine

## 2021-03-18 VITALS — HR 84 | Temp 98.3°F | Resp 12 | Ht 63.0 in | Wt 117.4 lb

## 2021-03-18 DIAGNOSIS — Z1231 Encounter for screening mammogram for malignant neoplasm of breast: Secondary | ICD-10-CM

## 2021-03-18 DIAGNOSIS — F988 Other specified behavioral and emotional disorders with onset usually occurring in childhood and adolescence: Secondary | ICD-10-CM

## 2021-03-18 DIAGNOSIS — Z0000 Encounter for general adult medical examination without abnormal findings: Secondary | ICD-10-CM | POA: Diagnosis not present

## 2021-03-18 DIAGNOSIS — R002 Palpitations: Secondary | ICD-10-CM

## 2021-03-18 DIAGNOSIS — R739 Hyperglycemia, unspecified: Secondary | ICD-10-CM | POA: Diagnosis not present

## 2021-03-18 DIAGNOSIS — Z23 Encounter for immunization: Secondary | ICD-10-CM

## 2021-03-18 DIAGNOSIS — Z79899 Other long term (current) drug therapy: Secondary | ICD-10-CM | POA: Diagnosis not present

## 2021-03-18 DIAGNOSIS — D649 Anemia, unspecified: Secondary | ICD-10-CM | POA: Diagnosis not present

## 2021-03-18 DIAGNOSIS — E785 Hyperlipidemia, unspecified: Secondary | ICD-10-CM

## 2021-03-18 DIAGNOSIS — F419 Anxiety disorder, unspecified: Secondary | ICD-10-CM

## 2021-03-18 DIAGNOSIS — H9190 Unspecified hearing loss, unspecified ear: Secondary | ICD-10-CM

## 2021-03-18 MED ORDER — LISDEXAMFETAMINE DIMESYLATE 30 MG PO CAPS: 30.0000 mg | ORAL_CAPSULE | Freq: Every day | ORAL | 0 refills | Status: DC

## 2021-03-18 MED ORDER — ESCITALOPRAM OXALATE 5 MG PO TABS: 5.0000 mg | ORAL_TABLET | Freq: Every day | ORAL | 3 refills | Status: DC

## 2021-03-18 NOTE — Assessment & Plan Note (Signed)
hgba1c acceptable, minimize simple carbs. Increase exercise as tolerated.  

## 2021-03-18 NOTE — Assessment & Plan Note (Addendum)
Patient encouraged to maintain heart healthy diet, regular exercise, adequate sleep. Consider daily probiotics. Take medications as prescribed.. Labs ordered and reviewed she will be at 2 years since last [facility] in November, she agrees to proceed with next [facility] by then. Due for colonoscopy next year

## 2021-03-18 NOTE — Patient Instructions (Signed)
Preventive Care 21-45 Years Old, Female Preventive care refers to lifestyle choices and visits with your health care provider that can promote health and wellness. This includes: A yearly physical exam. This is also called an annual wellness visit. Regular dental and eye exams. Immunizations. Screening for certain conditions. Healthy lifestyle choices, such as: Eating a healthy diet. Getting regular exercise. Not using drugs or products that contain nicotine and tobacco. Limiting alcohol use. What can I expect for my preventive care visit? Physical exam Your health care provider may check your: Height and weight. These may be used to calculate your BMI (body mass index). BMI is a measurement that tells if you are at a healthy weight. Heart rate and blood pressure. Body temperature. Skin for abnormal spots. Counseling Your health care provider may ask you questions about your: Past medical problems. Family's medical history. Alcohol, tobacco, and drug use. Emotional well-being. Home life and relationship well-being. Sexual activity. Diet, exercise, and sleep habits. Work and work environment. Access to firearms. Method of birth control. Menstrual cycle. Pregnancy history. What immunizations do I need? Vaccines are usually given at various ages, according to a schedule. Your health care provider will recommend vaccines for you based on your age, medical history, and lifestyle or other factors, such as travel or where you work. What tests do I need? Blood tests Lipid and cholesterol levels. These may be checked every 5 years starting at age 20. Hepatitis C test. Hepatitis B test. Screening Diabetes screening. This is done by checking your blood sugar (glucose) after you have not eaten for a while (fasting). STD (sexually transmitted disease) testing, if you are at risk. BRCA-related cancer screening. This may be done if you have a family history of breast, ovarian, tubal, or  peritoneal cancers. Pelvic exam and Pap test. This may be done every 3 years starting at age 21. Starting at age 30, this may be done every 5 years if you have a Pap test in combination with an HPV test. Talk with your health care provider about your test results, treatment options, and if necessary, the need for more tests. Follow these instructions at home: Eating and drinking  Eat a healthy diet that includes fresh fruits and vegetables, whole grains, lean protein, and low-fat dairy products. Take vitamin and mineral supplements as recommended by your health care provider. Do not drink alcohol if: Your health care provider tells you not to drink. You are pregnant, may be pregnant, or are planning to become pregnant. If you drink alcohol: Limit how much you have to 0-1 drink a day. Be aware of how much alcohol is in your drink. In the [place] one drink equals one 12 oz bottle of beer (355 mL), one 5 oz glass of wine (148 mL), or one 1 oz glass of hard liquor (44 mL). Lifestyle Take daily care of your teeth and gums. Brush your teeth every morning and night with fluoride toothpaste. Floss one time each day. Stay active. Exercise for at least 30 minutes 5 or more days each week. Do not use any products that contain nicotine or tobacco, such as cigarettes, e-cigarettes, and chewing tobacco. If you need help quitting, ask your health care provider. Do not use drugs. If you are sexually active, practice safe sex. Use a condom or other form of protection to prevent STIs (sexually transmitted infections). If you do not wish to become pregnant, use a form of birth control. If you plan to become pregnant, see your health care provider   for a prepregnancy visit. Find healthy ways to cope with stress, such as: Meditation, yoga, or listening to music. Journaling. Talking to a trusted person. Spending time with friends and family. Safety Always wear your seat belt while driving or riding in a  vehicle. Do not drive: If you have been drinking alcohol. Do not ride with someone who has been drinking. When you are tired or distracted. While texting. Wear a helmet and other protective equipment during sports activities. If you have firearms in your house, make sure you follow all gun safety procedures. Seek help if you have been physically or sexually abused. What's next? Go to your health care provider once a year for an annual wellness visit. Ask your health care provider how often you should have your eyes and teeth checked. Stay up to date on all vaccines. This information is not intended to replace advice given to you by your health care provider. Make sure you discuss any questions you have with your health care provider. Document Revised: 08/21/2020 Document Reviewed: 02/22/2018 Elsevier Patient Education  2022 Elsevier Inc.  

## 2021-03-18 NOTE — Progress Notes (Signed)
Patient ID: Sandra Long, female    DOB: 1975/10/04  Age: 45 y.o. MRN: [id number]    Subjective:   Chief Complaint  Patient presents with   Annual Exam   Subjective  HPI Sandra Long presents for office visit today for comprehensive physical exam today and follow up on management of chronic concerns. She is doing well on Vyvance 30 mg and currently takes it on weekdays and takes breaks on weekends. Denies CP/palp/SOB/HA/congestion/fevers/GI or GU c/o. Taking meds as prescribed.   She has had 3 ear tube replacement surgeries. Prozac has previously worked for her, but had the side effect of lowered libido. She has tried Buspar as well, but it was not as effective as Prozac.   Review of Systems  Constitutional:  Negative for chills, fatigue and fever.  HENT:  Negative for congestion, rhinorrhea, sinus pressure, sinus pain, sore throat and trouble swallowing.   Eyes:  Negative for pain.  Respiratory:  Negative for cough and shortness of breath.   Cardiovascular:  Negative for chest pain, palpitations and leg swelling.  Gastrointestinal:  Negative for abdominal pain, blood in stool, diarrhea, nausea and vomiting.  Genitourinary:  Negative for decreased urine volume, flank pain, frequency, vaginal bleeding and vaginal discharge.  Musculoskeletal:  Negative for back pain.  Neurological:  Negative for headaches.   History Past Medical History:  Diagnosis Date   Anemia 06/07/2016   Cervical disc disease 11/25/2014   MRI 2014  1.  Moderate left central and foraminal stenosis due to a large left lateral recess disc protrusion at C6-7, potentially with a small amount of marginal blood products - correlate with chronicity of onset of patient's symptoms. 2.  There is also moderate right eccentric central stenosis at the C5-6 level due to a right paracentral disc protrusion. 3.  Chronic ethmoid sinusitis. 4.  Mild    Dog bite 10/20/2016   Dyslipidemia 06/07/2016   Eye disease 12/07/2014   Macular  rosacea   Gestational diabetes 2009   Hyperglycemia 06/07/2016   Palpitations 11/25/2014   Paresthesia 11/25/2014   Preventative health care 12/07/2014    She has a past surgical history that includes Cesarean section (2007); Wisdom tooth extraction; and Cesarean section (N/A, 09/08/2015).   Her family history includes Blindness in her father; COPD in her son; Cancer in her maternal grandfather and paternal grandfather; Colon polyps in her mother; Diabetes in her father; Gout in her brother and father; Heart disease in her father and mother; Stroke in her father and paternal grandmother; Tremor in her mother.She reports that she has never smoked. She has never used smokeless tobacco. She reports current alcohol use. She reports that she does not use drugs.  Current Outpatient Medications on File Prior to Visit  Medication Sig Dispense Refill   hydrocortisone 2.5 % lotion Apply topically 2 (two) times daily. 59 mL 0   Magnesium 200 MG TABS Take 400 mg by mouth daily.     Multiple Vitamins-Minerals (MULTIVITAMIN ADULT PO) Take 1 tablet by mouth daily.     Olopatadine HCl 0.2 % SOLN Use one drop daily. 1 Bottle 0   valACYclovir (VALTREX) 1000 MG tablet Take 2 tablets by mouth now and repeat in 12 hours 4 tablet 2   Vitamin D, Cholecalciferol, 1000 UNITS TABS Take 2,000 Units by mouth daily.     No current facility-administered medications on file prior to visit.     Objective:  Objective  Physical Exam Constitutional:      General: She  is not in acute distress.    Appearance: Normal appearance. She is not ill-appearing or toxic-appearing.  HENT:     Head: Normocephalic and atraumatic.     Right Ear: Tympanic membrane, ear canal and external ear normal.     Left Ear: Tympanic membrane, ear canal and external ear normal.     Nose: No congestion or rhinorrhea.  Eyes:     Extraocular Movements: Extraocular movements intact.     Pupils: Pupils are equal, round, and reactive to light.   Cardiovascular:     Rate and Rhythm: Normal rate and regular rhythm.     Pulses: Normal pulses.     Heart sounds: Normal heart sounds. No murmur heard. Pulmonary:     Effort: Pulmonary effort is normal. No respiratory distress.     Breath sounds: Normal breath sounds. No wheezing, rhonchi or rales.  Abdominal:     General: Bowel sounds are normal.     Palpations: Abdomen is soft. There is no mass.     Tenderness: There is no abdominal tenderness. There is no guarding.     Hernia: No hernia is present.  Musculoskeletal:        General: Normal range of motion.     Cervical back: Normal range of motion and neck supple.  Skin:    General: Skin is warm and dry.  Neurological:     Mental Status: She is alert and oriented to person, place, and time.     Cranial Nerves: No facial asymmetry.     Motor: Motor function is intact. No weakness.     Deep Tendon Reflexes:     Reflex Scores:      Patellar reflexes are 2+ on the right side and 2+ on the left side. Psychiatric:        Behavior: Behavior normal.   Pulse 84   Temp 98.3 F (36.8 C) (Oral)   Resp 12   Ht 5\' 3"  (1.6 m)   Wt 117 lb 6.4 oz (53.3 kg)   LMP 03/04/2021   SpO2 97%   BMI 20.80 kg/m  Wt Readings from Last 3 Encounters:  03/18/21 117 lb 6.4 oz (53.3 kg)  11/05/19 117 lb 12.8 oz (53.4 kg)  07/05/19 120 lb (54.4 kg)     Lab Results  Component Value Date   WBC 4.6 11/05/2019   HGB 13.2 11/05/2019   HCT 39.2 11/05/2019   PLT 199.0 11/05/2019   GLUCOSE 111 (H) 11/05/2019   CHOL 133 11/05/2019   TRIG 54.0 11/05/2019   HDL 53.00 11/05/2019   LDLCALC 70 11/05/2019   ALT 17 11/05/2019   AST 21 11/05/2019   NA 139 11/05/2019   K 4.5 11/05/2019   CL 103 11/05/2019   CREATININE 0.50 11/05/2019   BUN 7 11/05/2019   CO2 28 11/05/2019   TSH 3.76 11/05/2019   HGBA1C 5.6 11/05/2019    No results found.   Assessment & Plan:  Plan    Meds ordered this encounter  Medications   lisdexamfetamine (VYVANSE) 30  MG capsule    Sig: Take 1 capsule (30 mg total) by mouth daily.    Dispense:  30 capsule    Refill:  0   escitalopram (LEXAPRO) 5 MG tablet    Sig: Take 1 tablet (5 mg total) by mouth daily.    Dispense:  30 tablet    Refill:  3    Problem List Items Addressed This Visit     Palpitations   Relevant  Orders   TSH   Preventative health care    Patient encouraged to maintain heart healthy diet, regular exercise, adequate sleep. Consider daily probiotics. Take medications as prescribed.. Labs ordered and reviewed she will be at 2 years since last [facility] in November, she agrees to proceed with next [facility] by then. Due for colonoscopy next year      Hyperglycemia    hgba1c acceptable, minimize simple carbs. Increase exercise as tolerated.       Relevant Orders   Comprehensive metabolic panel   Hemoglobin A1c   Anemia   Relevant Orders   CBC   Dyslipidemia   Relevant Orders   Lipid panel   Attention deficit disorder - Primary    Vyvanse 30 mg has been a good dose. No changes today      Relevant Orders   Drug Monitoring Panel [phone] , Urine   Anxiety    Had a good calming effect from Prozac in the past but her side effect of decreased libido was too much so she stopped it. She is willing to try a new SSRI, she is started on Lexapro 5 mg daily and reassess in 2-3 months or as needed      Relevant Medications   escitalopram (LEXAPRO) 5 MG tablet   Hearing loss    Had several sets of tubes in her ears as a child and has always had some trouble with worsening hearing loss recently. Is referred for audiology evaluation       Relevant Orders   Ambulatory referral to Audiology   Other Visit Diagnoses     High risk medication use       Relevant Orders   Drug Monitoring Panel [phone] , Urine   Encounter for screening mammogram for malignant neoplasm of breast       Influenza vaccine administered       Relevant Orders   Flu Vaccine QUAD 36+ mos IM (Fluarix, Fluzone & Afluria Quad PF  (Completed)       Follow-up: Return in about 2 months (around 05/31/2021), or VV.  I, Suezanne Jacquet, acting as a scribe for Penni Homans, MD, have documented all relevent documentation on behalf of Penni Homans, MD, as directed by Penni Homans, MD while in the presence of Penni Homans, MD. DO:03/19/21.  I, Mosie Lukes, MD personally performed the services described in this documentation. All medical record entries made by the scribe were at my direction and in my presence. I have reviewed the chart and agree that the record reflects my personal performance and is accurate and complete

## 2021-03-19 ENCOUNTER — Encounter: Payer: Self-pay | Admitting: Family Medicine

## 2021-03-19 DIAGNOSIS — H9190 Unspecified hearing loss, unspecified ear: Secondary | ICD-10-CM | POA: Insufficient documentation

## 2021-03-19 LAB — CBC
HCT: 39.4 % (ref 36.0–46.0)
Hemoglobin: 12.9 g/dL (ref 12.0–15.0)
MCHC: 32.8 g/dL (ref 30.0–36.0)
MCV: 92.3 fl (ref 78.0–100.0)
Platelets: 219 10*3/uL (ref 150.0–400.0)
RBC: 4.27 Mil/uL (ref 3.87–5.11)
RDW: 13.8 % (ref 11.5–15.5)
WBC: 6.7 10*3/uL (ref 4.0–10.5)

## 2021-03-19 LAB — COMPREHENSIVE METABOLIC PANEL
ALT: 16 U/L (ref 0–35)
AST: 20 U/L (ref 0–37)
Albumin: 4.2 g/dL (ref 3.5–5.2)
Alkaline Phosphatase: 38 U/L — ABNORMAL LOW (ref 39–117)
BUN: 9 mg/dL (ref 6–23)
CO2: 30 mEq/L (ref 19–32)
Calcium: 9.1 mg/dL (ref 8.4–10.5)
Chloride: 100 mEq/L (ref 96–112)
Creatinine, Ser: 0.54 mg/dL (ref 0.40–1.20)
GFR: 111.72 mL/min (ref >60.00)
Glucose, Bld: 82 mg/dL (ref 70–99)
Potassium: 5 mEq/L (ref 3.5–5.1)
Sodium: 136 mEq/L (ref 135–145)
Total Bilirubin: 0.4 mg/dL (ref 0.2–1.2)
Total Protein: 6.5 g/dL (ref 6.0–8.3)

## 2021-03-19 LAB — LIPID PANEL
Cholesterol: 130 mg/dL (ref 0–200)
HDL: 46.8 mg/dL (ref >39.00)
LDL Cholesterol: 74 mg/dL (ref 0–99)
NonHDL: 83.43
Total CHOL/HDL Ratio: 3
Triglycerides: 47 mg/dL (ref 0.0–149.0)
VLDL: 9.4 mg/dL (ref 0.0–40.0)

## 2021-03-19 LAB — HEMOGLOBIN A1C: Hgb A1c MFr Bld: 5.8 % (ref 4.6–6.5)

## 2021-03-19 LAB — TSH: TSH: 2.58 u[IU]/mL (ref 0.35–5.50)

## 2021-03-19 NOTE — Assessment & Plan Note (Signed)
Had several sets of tubes in her ears as a child and has always had some trouble with worsening hearing loss recently. Is referred for audiology evaluation

## 2021-03-19 NOTE — Assessment & Plan Note (Signed)
Had a good calming effect from Prozac in the past but her side effect of decreased libido was too much so she stopped it. She is willing to try a new SSRI, she is started on Lexapro 5 mg daily and reassess in 2-3 months or as needed

## 2021-03-19 NOTE — Assessment & Plan Note (Signed)
Vyvanse 30 mg has been a good dose. No changes today

## 2021-03-24 LAB — DRUG MONITORING PANEL 376104, URINE
Amphetamine: NEGATIVE ng/mL (ref <250)
Amphetamines: NEGATIVE ng/mL (ref <500)
Barbiturates: NEGATIVE ng/mL (ref <300)
Benzodiazepines: NEGATIVE ng/mL (ref <100)
Cocaine Metabolite: NEGATIVE ng/mL (ref <150)
Desmethyltramadol: NEGATIVE ng/mL (ref <100)
Methamphetamine: NEGATIVE ng/mL (ref <250)
Opiates: NEGATIVE ng/mL (ref <100)
Oxycodone: NEGATIVE ng/mL (ref <100)
Tramadol: NEGATIVE ng/mL (ref <100)

## 2021-03-24 LAB — DM TEMPLATE

## 2021-04-13 ENCOUNTER — Other Ambulatory Visit: Payer: Self-pay | Admitting: Family Medicine

## 2021-04-20 ENCOUNTER — Other Ambulatory Visit: Payer: Self-pay | Admitting: Family Medicine

## 2021-04-20 MED ORDER — LISDEXAMFETAMINE DIMESYLATE 30 MG PO CAPS: 30.0000 mg | ORAL_CAPSULE | Freq: Every day | ORAL | 0 refills | Status: DC

## 2021-04-20 NOTE — Telephone Encounter (Signed)
Requesting: Vyvanse Contract: 03/18/21 UDS: 03/18/21 Last Visit: 03/18/21 Next Visit: none  Last Refill: 03/18/21  Please Advise

## 2021-05-04 ENCOUNTER — Telehealth: Payer: Self-pay | Admitting: Family Medicine

## 2021-05-04 NOTE — Telephone Encounter (Signed)
Spoke with pt and she printed it off herself

## 2021-05-04 NOTE — Telephone Encounter (Signed)
Pt was requesting immunization records for the flu shot for work, she stated she would like them printed, on mychart, and emailed if possible. Please advise.

## 2021-05-12 ENCOUNTER — Other Ambulatory Visit: Payer: Self-pay | Admitting: Family Medicine

## 2021-05-28 ENCOUNTER — Other Ambulatory Visit: Payer: Self-pay | Admitting: Family Medicine

## 2021-05-31 MED ORDER — LISDEXAMFETAMINE DIMESYLATE 30 MG PO CAPS: 30.0000 mg | ORAL_CAPSULE | Freq: Every day | ORAL | 0 refills | Status: DC

## 2021-05-31 NOTE — Telephone Encounter (Signed)
Requesting: Vyvanse 30mg   Contract: 03/18/2021 UDS: 03/18/2021 Last Visit: 03/18/2021 Next Visit: None Last Refill: 04/20/2021 #30 and 0RF  Please Advise

## 2021-06-01 ENCOUNTER — Other Ambulatory Visit: Payer: Self-pay

## 2021-06-01 ENCOUNTER — Ambulatory Visit (HOSPITAL_BASED_OUTPATIENT_CLINIC_OR_DEPARTMENT_OTHER)
Admission: RE | Admit: 2021-06-01 | Discharge: 2021-06-01 | Disposition: A | Discharge status: Home / Self Care | Payer: No Typology Code available for payment source | Source: Ambulatory Visit | Attending: Family Medicine | Admitting: Family Medicine

## 2021-06-01 ENCOUNTER — Encounter (HOSPITAL_BASED_OUTPATIENT_CLINIC_OR_DEPARTMENT_OTHER): Payer: Self-pay

## 2021-06-01 DIAGNOSIS — Z1231 Encounter for screening mammogram for malignant neoplasm of breast: Secondary | ICD-10-CM | POA: Insufficient documentation

## 2021-06-14 ENCOUNTER — Other Ambulatory Visit: Payer: Self-pay | Admitting: Family Medicine

## 2021-06-18 ENCOUNTER — Other Ambulatory Visit: Payer: Self-pay | Admitting: Family Medicine

## 2021-07-30 ENCOUNTER — Other Ambulatory Visit: Payer: Self-pay | Admitting: Family Medicine

## 2021-08-02 MED ORDER — LISDEXAMFETAMINE DIMESYLATE 30 MG PO CAPS: 30.0000 mg | ORAL_CAPSULE | Freq: Every day | ORAL | 0 refills | Status: DC

## 2021-08-02 NOTE — Telephone Encounter (Signed)
Requesting: Vyvanse 30mg   Contract: 03/18/2021  UDS: 03/18/2021 Last Visit: 03/18/2021 Next Visit: None Last Refill: 05/31/2021 #30 and 0RF  Please Advise

## 2021-09-03 ENCOUNTER — Other Ambulatory Visit: Payer: Self-pay

## 2021-09-03 ENCOUNTER — Telehealth: Payer: Self-pay | Admitting: Family Medicine

## 2021-09-03 ENCOUNTER — Encounter: Payer: Self-pay | Admitting: Family Medicine

## 2021-09-03 DIAGNOSIS — B001 Herpesviral vesicular dermatitis: Secondary | ICD-10-CM

## 2021-09-03 MED ORDER — LISDEXAMFETAMINE DIMESYLATE 30 MG PO CAPS: 30.0000 mg | ORAL_CAPSULE | Freq: Every day | ORAL | 0 refills | Status: DC

## 2021-09-03 MED ORDER — VALACYCLOVIR HCL 1 G PO TABS: ORAL_TABLET | ORAL | 2 refills | Status: DC

## 2021-09-03 NOTE — Telephone Encounter (Signed)
[facility] Pt.  ? ?Requesting: Vyvanse '30mg'$   ?Contract: 03/18/2021 ?UDS: 03/18/2021 ?Last Visit: 03/18/2021 ?Next Visit: None ?Last Refill: 08/02/21 #30 and 0RF ? ?Please Advise ? ?

## 2021-11-11 ENCOUNTER — Other Ambulatory Visit: Payer: Self-pay | Admitting: Family Medicine

## 2021-11-11 ENCOUNTER — Encounter: Payer: Self-pay | Admitting: *Deleted

## 2021-11-11 NOTE — Telephone Encounter (Signed)
Requesting: Vyvanse Contract: 03/18/21 UDS:  03/18/21 Last Visit: 03/18/21 Next Visit: none Last Refill: 09/03/21  Message sent to patient to follow up.  Please Advise

## 2021-11-12 MED ORDER — LISDEXAMFETAMINE DIMESYLATE 30 MG PO CAPS: 30.0000 mg | ORAL_CAPSULE | Freq: Every day | ORAL | 0 refills | Status: DC

## 2021-12-23 ENCOUNTER — Ambulatory Visit: Payer: No Typology Code available for payment source | Admitting: Family Medicine

## 2021-12-23 ENCOUNTER — Encounter: Payer: Self-pay | Admitting: Family Medicine

## 2021-12-23 VITALS — HR 69 | Temp 97.6°F | Ht 63.0 in | Wt 122.4 lb

## 2021-12-23 DIAGNOSIS — Z1211 Encounter for screening for malignant neoplasm of colon: Secondary | ICD-10-CM | POA: Diagnosis not present

## 2021-12-23 DIAGNOSIS — Z79899 Other long term (current) drug therapy: Secondary | ICD-10-CM

## 2021-12-23 DIAGNOSIS — F988 Other specified behavioral and emotional disorders with onset usually occurring in childhood and adolescence: Secondary | ICD-10-CM | POA: Diagnosis not present

## 2021-12-23 DIAGNOSIS — E785 Hyperlipidemia, unspecified: Secondary | ICD-10-CM

## 2021-12-23 DIAGNOSIS — F419 Anxiety disorder, unspecified: Secondary | ICD-10-CM

## 2021-12-23 MED ORDER — LISDEXAMFETAMINE DIMESYLATE 30 MG PO CAPS: 30.0000 mg | ORAL_CAPSULE | Freq: Every day | ORAL | 0 refills | Status: DC

## 2021-12-23 MED ORDER — LISDEXAMFETAMINE DIMESYLATE 30 MG PO CAPS
30.0000 mg | ORAL_CAPSULE | Freq: Every day | ORAL | 0 refills | Status: DC
Start: 2021-12-23 — End: 2022-07-11

## 2021-12-23 NOTE — Patient Instructions (Signed)
Yerba Mat[employer] do not drink  Colorectal Cancer Screening  Colorectal cancer screening is a group of tests that are used to check for colorectal cancer before symptoms develop. Colorectal refers to the colon and rectum. The colon and rectum are located at the end of the digestive tract and carry stool (feces) out of the body. Who should have screening? All adults who are 27-46 years old should have screening. Your health care provider may recommend screening before age 69. You will have tests every 1-10 years, depending on your results and the type of screening test. Screening recommendations for adults who are 36-53 years old vary depending on a person's health. People older than age 46 should no longer get colorectal cancer screening. You may have screening tests starting before age 34, or more often than other people, if you have any of these risk factors: A personal or family history of colorectal cancer or abnormal growths known as polyps in your colon. Inflammatory bowel disease, such as ulcerative colitis or Crohn's disease. A history of having radiation treatment to the abdomen or the area between the hip bones (pelvic area) for cancer. A type of genetic syndrome that is passed from parent to child (hereditary), such as: Lynch syndrome. Familial adenomatous polyposis. Turcot syndrome. Peutz-Jeghers syndrome. MUTYH-associated polyposis (MAP). A personal history of diabetes. Types of tests There are several types of colorectal screening tests. You may have one or more of the following: Guaiac-based fecal occult blood testing. For this test, a stool sample is checked for hidden (occult) blood, which could be a sign of colorectal cancer. Fecal immunochemical test (FIT). For this test, a stool sample is checked for blood, which could be a sign of colorectal cancer. Stool DNA test. For this test, a stool sample is checked for blood and changes in DNA that could lead to colorectal  cancer. Sigmoidoscopy. During this test, a thin, flexible tube with a camera on the end, called a sigmoidoscope, is used to examine the rectum and the lower colon. Colonoscopy. During this test, a long, flexible tube with a camera on the end, called a colonoscope, is used to examine the entire colon and rectum. Also, sometimes a tissue sample is taken to be looked at under a microscope (biopsy) or small polyps are removed during this test. Virtual colonoscopy. Instead of a colonoscope, this type of colonoscopy uses a CT scan to take pictures of the colon and rectum. A CT scan is a type of X-ray that is made using computers. What are the benefits of screening? Screening reduces your risk for colorectal cancer and can help identify cancer at an early stage, when the cancer can be removed or treated more easily. It is common for polyps to form in the lining of the colon, especially as you age. These polyps may be cancerous or become cancerous over time. Screening can identify these polyps. What are the risks of screening? Generally, these are safe tests. However, problems may occur, including: The need for more tests to confirm results from a stool sample test. Stool sample tests have fewer risks than other types of screening tests. Being exposed to low levels of radiation, if you had a test involving X-rays. This may slightly increase your cancer risk. The benefit of detecting cancer outweighs the slight increase in risk. Bleeding, damage to the intestine, or infection caused by a sigmoidoscopy or colonoscopy. A reaction to medicines given during a sigmoidoscopy or colonoscopy. Talk with your health care provider to understand your risk  for colorectal cancer and to make a screening plan that is right for you. Questions to ask your health care provider When should I start colorectal cancer screening? What is my risk for colorectal cancer? How often do I need screening? Which screening tests do I  need? How do I get my test results? What do my results mean? Where to find more information Learn more about colorectal cancer screening from: The American Cancer Society: cancer.Funk: cancer.gov Summary Colorectal cancer screening is a group of tests used to check for colorectal cancer before symptoms develop. All adults who are 7-83 years old should have screening. Your health care provider may recommend screening before age 48. You may have screening tests starting before age 75, or more often than other people, if you have certain risk factors. Screening reduces your risk for colorectal cancer and can help identify cancer at an early stage, when the cancer can be removed or treated more easily. Talk with your health care provider to understand your risk for colorectal cancer and to make a screening plan that is right for you. This information is not intended to replace advice given to you by your health care provider. Make sure you discuss any questions you have with your health care provider. Document Revised: 10/02/2019 Document Reviewed: 10/02/2019 Elsevier Patient Education  [place].

## 2021-12-23 NOTE — Assessment & Plan Note (Signed)
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 She is going to start L theonine and see if that helps.

## 2021-12-23 NOTE — Assessment & Plan Note (Addendum)
Doing well, update UDS, refill meds today

## 2021-12-23 NOTE — Assessment & Plan Note (Signed)
Encourage heart healthy diet such as MIND or DASH diet, increase exercise, avoid trans fats, simple carbohydrates and processed foods, consider a krill or fish or flaxseed oil cap daily.  °

## 2021-12-23 NOTE — Progress Notes (Signed)
Subjective:    Patient ID: Sandra Long, female    DOB: 10/23/1975, 46 y.o.   MRN: [id number]  Chief Complaint  Patient presents with   ADHD    HPI Patient is in today for medication refills. Overall she is doing well. No recent febrile illness or acute hospitalizations. 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 She notes her Vyvanse is helpful but she does note some insomnia with it at times. No complaints of headache or palpitations. Denies CP/palp/SOB/HA/congestion/fevers/GI or GU c/o. Taking meds as prescribed   Past Medical History:  Diagnosis Date   Anemia 06/07/2016   Cervical disc disease 11/25/2014   MRI 2014  1.  Moderate left central and foraminal stenosis due to a large left lateral recess disc protrusion at C6-7, potentially with a small amount of marginal blood products - correlate with chronicity of onset of patient's symptoms. 2.  There is also moderate right eccentric central stenosis at the C5-6 level due to a right paracentral disc protrusion. 3.  Chronic ethmoid sinusitis. 4.  Mild    Dog bite 10/20/2016   Dyslipidemia 06/07/2016   Eye disease 12/07/2014   Macular rosacea   Gestational diabetes 2009   Hyperglycemia 06/07/2016   Palpitations 11/25/2014   Paresthesia 11/25/2014   Preventative health care 12/07/2014    Past Surgical History:  Procedure Laterality Date   CESAREAN SECTION  2007   CESAREAN SECTION N/A 09/08/2015   Procedure: Repeat CESAREAN SECTION;  Surgeon: Brien Few, MD;  Location: [facility];  Service: Obstetrics;  Laterality: N/A;  EDD: 09/14/15   WISDOM TOOTH EXTRACTION      Family History  Problem Relation Age of Onset   Heart disease Mother        2 stents 2015   Tremor Mother        benign essential   Colon polyps Mother    Heart disease Father        stents   Diabetes Father        73   Stroke Father    Gout Father    Blindness Father    Gout Brother    COPD Son    Cancer Maternal Grandfather        Multiple  Myeloma   Stroke Paternal Grandmother    Cancer Paternal Grandfather        bladder cancer    Social History   Socioeconomic History   Marital status: Married    Spouse name: Not on file   Number of children: Not on file   Years of education: Not on file   Highest education level: Not on file  Occupational History   Occupation: RN [employer]  Tobacco Use   Smoking status: Never   Smokeless tobacco: Never  Substance and Sexual Activity   Alcohol use: Yes    Alcohol/week: 0.0 standard drinks of alcohol   Drug use: No   Sexual activity: Yes    Comment: lives with husband, no red meat, works as Therapist, sports at [employer]  Other Topics Concern   Not on file  Social History Narrative   Not on file   Social Determinants of Health   Financial Resource Strain: Not on file  Food Insecurity: Not on file  Transportation Needs: Not on file  Physical Activity: Not on file  Stress: Not on file  Social Connections: Not on file  Intimate Partner Violence: Not on file    Outpatient  Medications Prior to Visit  Medication Sig Dispense Refill   lisdexamfetamine (VYVANSE) 30 MG capsule Take 1 capsule (30 mg total) by mouth daily. 30 capsule 0   Magnesium 200 MG TABS Take 400 mg by mouth daily. (Patient not taking: Reported on 12/23/2021)     Vitamin D, Cholecalciferol, 1000 UNITS TABS Take 2,000 Units by mouth daily. (Patient not taking: Reported on 12/23/2021)     escitalopram (LEXAPRO) 5 MG tablet TAKE 1 TABLET (5 MG TOTAL) BY MOUTH DAILY. 30 tablet 0   hydrocortisone 2.5 % lotion Apply topically 2 (two) times daily. 59 mL 0   Multiple Vitamins-Minerals (MULTIVITAMIN ADULT PO) Take 1 tablet by mouth daily.     Olopatadine HCl 0.2 % SOLN Use one drop daily. 1 Bottle 0   valACYclovir (VALTREX) 1000 MG tablet Take 2 tablets by mouth now and repeat in 12 hours 4 tablet 2   No facility-administered medications prior to visit.    Allergies  Allergen Reactions   No  Known Allergies     Review of Systems  Constitutional:  Negative for fever and malaise/fatigue.  HENT:  Negative for congestion.   Eyes:  Negative for blurred vision.  Respiratory:  Negative for shortness of breath.   Cardiovascular:  Negative for chest pain, palpitations and leg swelling.  Gastrointestinal:  Negative for abdominal pain, blood in stool and nausea.  Genitourinary:  Negative for dysuria and frequency.  Musculoskeletal:  Negative for falls.  Skin:  Negative for rash.  Neurological:  Negative for dizziness, loss of consciousness and headaches.  Endo/Heme/Allergies:  Negative for environmental allergies.  Psychiatric/Behavioral:  Negative for depression. The patient is nervous/anxious.        Objective:    Physical Exam Constitutional:      General: She is not in acute distress.    Appearance: She is well-developed.  HENT:     Head: Normocephalic and atraumatic.  Eyes:     Conjunctiva/sclera: Conjunctivae normal.  Neck:     Thyroid: No thyromegaly.  Cardiovascular:     Rate and Rhythm: Normal rate and regular rhythm.     Heart sounds: Normal heart sounds. No murmur heard. Pulmonary:     Effort: Pulmonary effort is normal. No respiratory distress.     Breath sounds: Normal breath sounds.  Abdominal:     General: Bowel sounds are normal. There is no distension.     Palpations: Abdomen is soft. There is no mass.     Tenderness: There is no abdominal tenderness.  Musculoskeletal:     Cervical back: Neck supple.  Lymphadenopathy:     Cervical: No cervical adenopathy.  Skin:    General: Skin is warm and dry.  Neurological:     Mental Status: She is alert and oriented to person, place, and time.  Psychiatric:        Behavior: Behavior normal.     Pulse 69   Temp 97.6 F (36.4 C) (Oral)   Ht '5\' 3"'$  (1.6 m)   Wt 122 lb 6.4 oz (55.5 kg)   LMP 12/19/2021   SpO2 100%   BMI 21.68 kg/m  Wt Readings from Last 3 Encounters:  12/23/21 122 lb 6.4 oz (55.5 kg)   03/18/21 117 lb 6.4 oz (53.3 kg)  11/05/19 117 lb 12.8 oz (53.4 kg)    Diabetic Foot Exam - Simple   No data filed    Lab Results  Component Value Date   WBC 6.7 03/18/2021   HGB 12.9 03/18/2021  HCT 39.4 03/18/2021   PLT 219.0 03/18/2021   GLUCOSE 82 03/18/2021   CHOL 130 03/18/2021   TRIG 47.0 03/18/2021   HDL 46.80 03/18/2021   LDLCALC 74 03/18/2021   ALT 16 03/18/2021   AST 20 03/18/2021   NA 136 03/18/2021   K 5.0 03/18/2021   CL 100 03/18/2021   CREATININE 0.54 03/18/2021   BUN 9 03/18/2021   CO2 30 03/18/2021   TSH 2.58 03/18/2021   HGBA1C 5.8 03/18/2021    Lab Results  Component Value Date   TSH 2.58 03/18/2021   Lab Results  Component Value Date   WBC 6.7 03/18/2021   HGB 12.9 03/18/2021   HCT 39.4 03/18/2021   MCV 92.3 03/18/2021   PLT 219.0 03/18/2021   Lab Results  Component Value Date   NA 136 03/18/2021   K 5.0 03/18/2021   CO2 30 03/18/2021   GLUCOSE 82 03/18/2021   BUN 9 03/18/2021   CREATININE 0.54 03/18/2021   BILITOT 0.4 03/18/2021   ALKPHOS 38 (L) 03/18/2021   AST 20 03/18/2021   ALT 16 03/18/2021   PROT 6.5 03/18/2021   ALBUMIN 4.2 03/18/2021   CALCIUM 9.1 03/18/2021   GFR 111.72 03/18/2021   Lab Results  Component Value Date   CHOL 130 03/18/2021   Lab Results  Component Value Date   HDL 46.80 03/18/2021   Lab Results  Component Value Date   LDLCALC 74 03/18/2021   Lab Results  Component Value Date   TRIG 47.0 03/18/2021   Lab Results  Component Value Date   CHOLHDL 3 03/18/2021   Lab Results  Component Value Date   HGBA1C 5.8 03/18/2021       Assessment & Plan:      Problem List Items Addressed This Visit     Dyslipidemia    Encourage heart healthy diet such as MIND or DASH diet, increase exercise, avoid trans fats, simple carbohydrates and processed foods, consider a krill or fish or flaxseed oil cap daily.       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 She is going to start L theonine and see if that helps.       Other Visit Diagnoses     High risk medication use       Relevant Orders   Drug Monitoring Panel [phone] , Urine   Colon cancer screening       Relevant Orders   Ambulatory referral to Gastroenterology       I have discontinued Evette Georges Dealmeida's Multiple Vitamins-Minerals (MULTIVITAMIN ADULT PO), Olopatadine HCl, hydrocortisone, escitalopram, and valACYclovir. I have also changed her lisdexamfetamine. Additionally, I am having her start on lisdexamfetamine and lisdexamfetamine. Lastly, I am having her maintain her Magnesium and Vitamin D (Cholecalciferol).  Meds ordered this encounter  Medications   lisdexamfetamine (VYVANSE) 30 MG capsule    Sig: Take 1 capsule (30 mg total) by mouth daily. June 2023 rx    Dispense:  30 capsule    Refill:  0   lisdexamfetamine (VYVANSE) 30 MG capsule    Sig: Take 1 capsule (30 mg total) by mouth daily.  July 2023    Dispense:  30 capsule    Refill:  0   lisdexamfetamine (VYVANSE) 30 MG capsule    Sig: Take 1 capsule (30 mg total) by mouth daily. August 2023    Dispense:  30 capsule    Refill:  0

## 2021-12-26 LAB — DRUG MONITORING PANEL 376104, URINE
Amphetamine: 697 ng/mL — ABNORMAL HIGH (ref <250)
Amphetamines: POSITIVE ng/mL — AB (ref <500)
Barbiturates: NEGATIVE ng/mL (ref <300)
Benzodiazepines: NEGATIVE ng/mL (ref <100)
Cocaine Metabolite: NEGATIVE ng/mL (ref <150)
Desmethyltramadol: NEGATIVE ng/mL (ref <100)
Methamphetamine: NEGATIVE ng/mL (ref <250)
Opiates: NEGATIVE ng/mL (ref <100)
Oxycodone: NEGATIVE ng/mL (ref <100)
Tramadol: NEGATIVE ng/mL (ref <100)

## 2021-12-26 LAB — DM TEMPLATE

## 2022-02-23 ENCOUNTER — Other Ambulatory Visit: Payer: Self-pay | Admitting: Family Medicine

## 2022-02-24 MED ORDER — LISDEXAMFETAMINE DIMESYLATE 30 MG PO CAPS: 30.0000 mg | ORAL_CAPSULE | Freq: Every day | ORAL | 0 refills | Status: DC

## 2022-02-24 NOTE — Telephone Encounter (Signed)
Requesting: Vyvanse '30mg'$   Contract: 03/18/21 UDS: 12/23/21 Last Visit: 12/23/21 Next Visit: 07/11/22 Last Refill: 12/23/21 #30 and 0RF (x3)  Please Advise

## 2022-02-28 ENCOUNTER — Other Ambulatory Visit: Payer: Self-pay | Admitting: Family Medicine

## 2022-02-28 DIAGNOSIS — B001 Herpesviral vesicular dermatitis: Secondary | ICD-10-CM

## 2022-05-10 ENCOUNTER — Encounter: Payer: Self-pay | Admitting: Gastroenterology

## 2022-05-27 ENCOUNTER — Ambulatory Visit (AMBULATORY_SURGERY_CENTER): Payer: No Typology Code available for payment source

## 2022-05-27 VITALS — Ht 63.0 in | Wt 121.0 lb

## 2022-05-27 DIAGNOSIS — Z1211 Encounter for screening for malignant neoplasm of colon: Secondary | ICD-10-CM

## 2022-05-27 MED ORDER — NA SULFATE-K SULFATE-MG SULF 17.5-3.13-1.6 GM/177ML PO SOLN: 1.0000 | Freq: Once | ORAL | 0 refills | Status: AC

## 2022-05-27 NOTE — Progress Notes (Signed)
No egg or soy allergy known to patient; No issues known to pt with past sedation with any surgeries or procedures; Patient denies ever being told they had issues or difficulty with intubation;  No FH of Malignant Hyperthermia; Pt is not on diet pills; Pt is not on home 02;  Pt is not on blood thinners;  Pt denies issues with constipation;  No A fib or A flutter; Have any cardiac testing pending--NO Pt instructed to use [email or web address] or GoodRx for a price reduction on prep;  Insurance verified during [place] appt=[employer]  Patient's chart reviewed by Sandra Long CNRA prior to previsit and patient appropriate for the [facility].  Previsit completed and red dot placed by patient's name on their procedure day (on provider's schedule).    GoodRx coupon for [facility] given to patient during [facility] appt;

## 2022-06-14 ENCOUNTER — Encounter: Payer: No Typology Code available for payment source | Admitting: Gastroenterology

## 2022-06-21 IMAGING — MG MM DIGITAL SCREENING BILAT W/ TOMO AND CAD
8 series · 9 of 24 positions shown · non-contrast
Comparison: Previous exam(s).

CLINICAL DATA: Screening.

EXAM:
DIGITAL SCREENING BILATERAL MAMMOGRAM WITH TOMOSYNTHESIS AND CAD
TECHNIQUE: Bilateral screening digital craniocaudal and mediolateral oblique
mammograms were obtained. Bilateral screening digital breast
tomosynthesis was performed. The images were evaluated with
computer-aided detection.

[L MLO synth-2D]
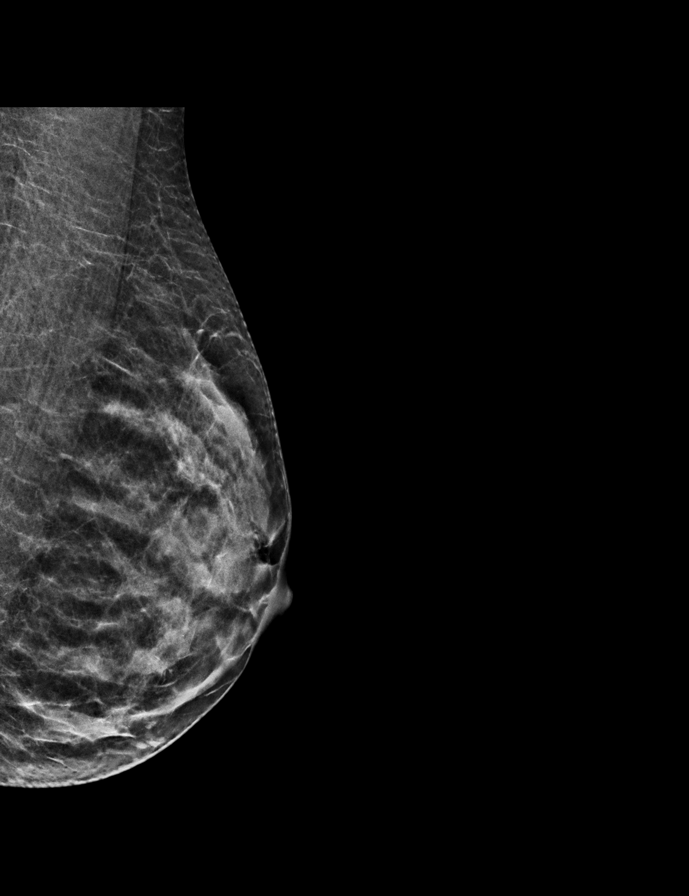

[R MLO synth-2D]
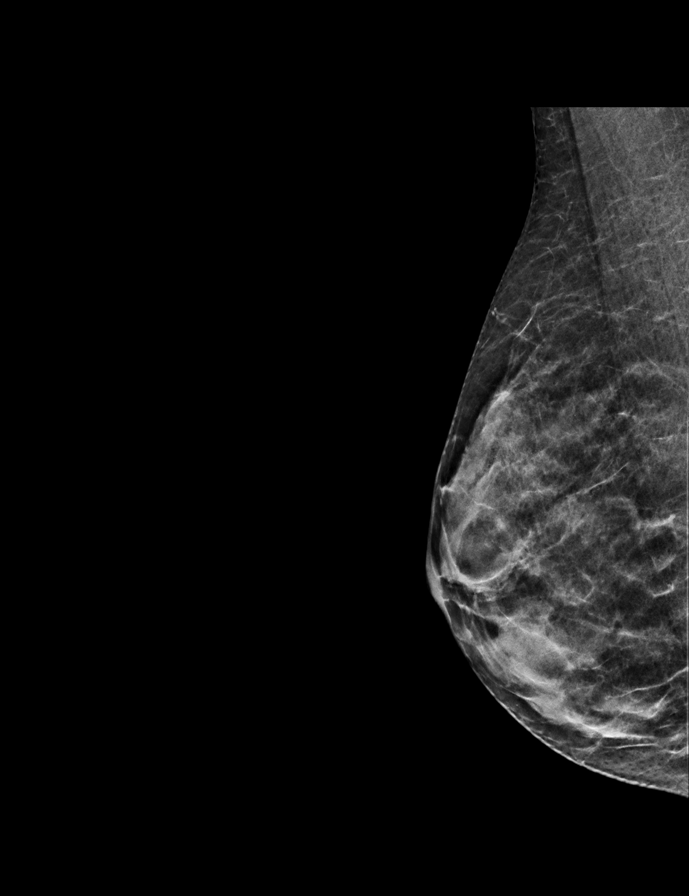

[L CC synth-2D]
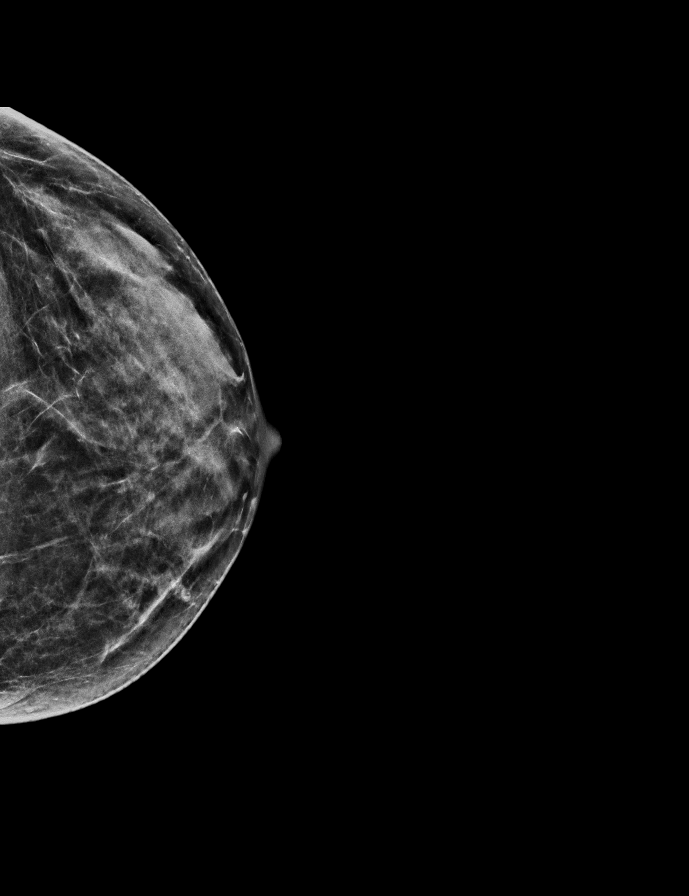

[R CC synth-2D]
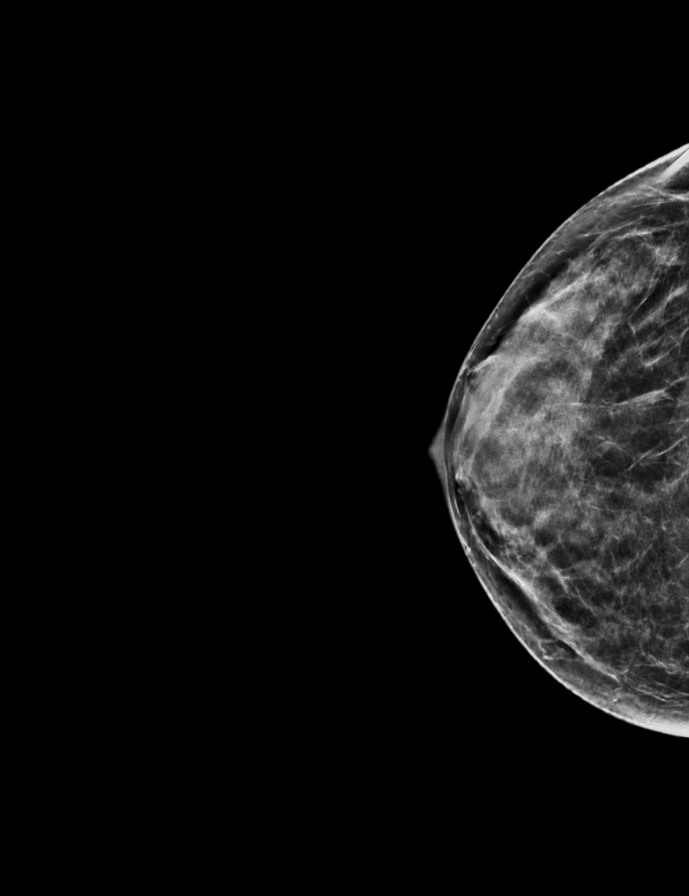

[R CC tomo · 2 of 43 frames shown]
[frame 14/43]
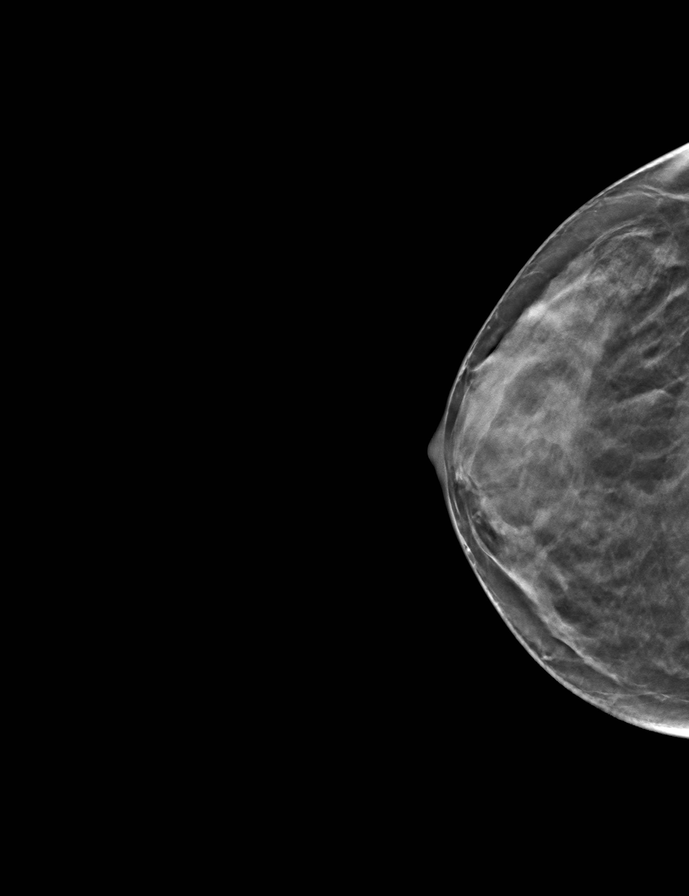
[frame 22/43]
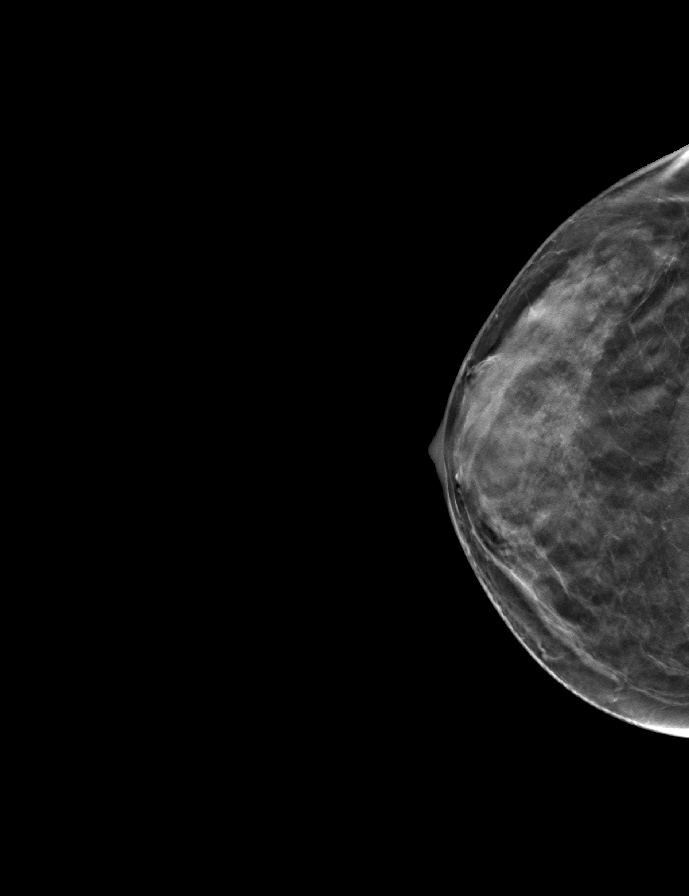

[L CC tomo · tomo slice 23/44.0]
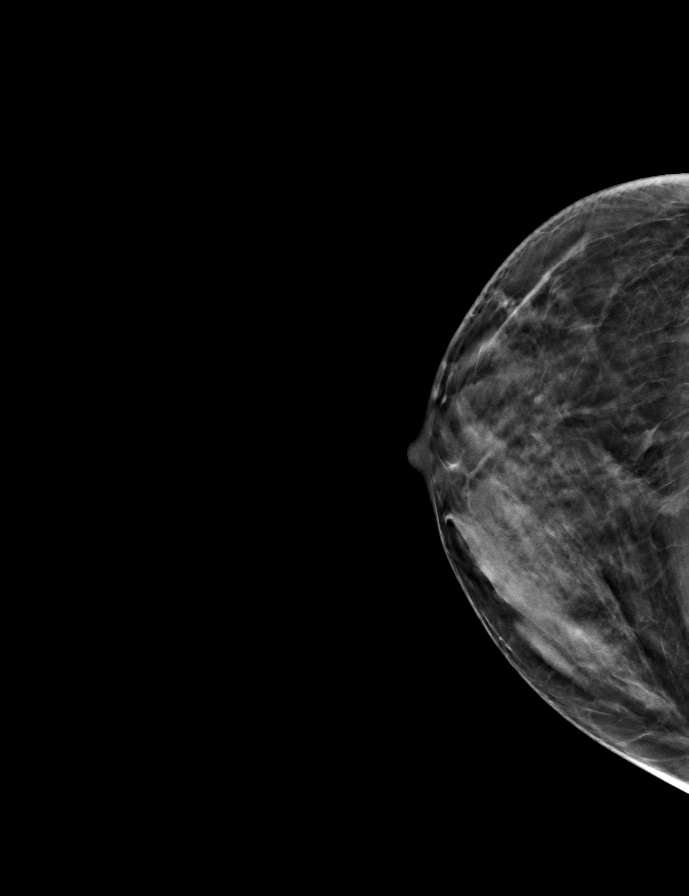

[L MLO tomo · tomo slice 22/43.0]
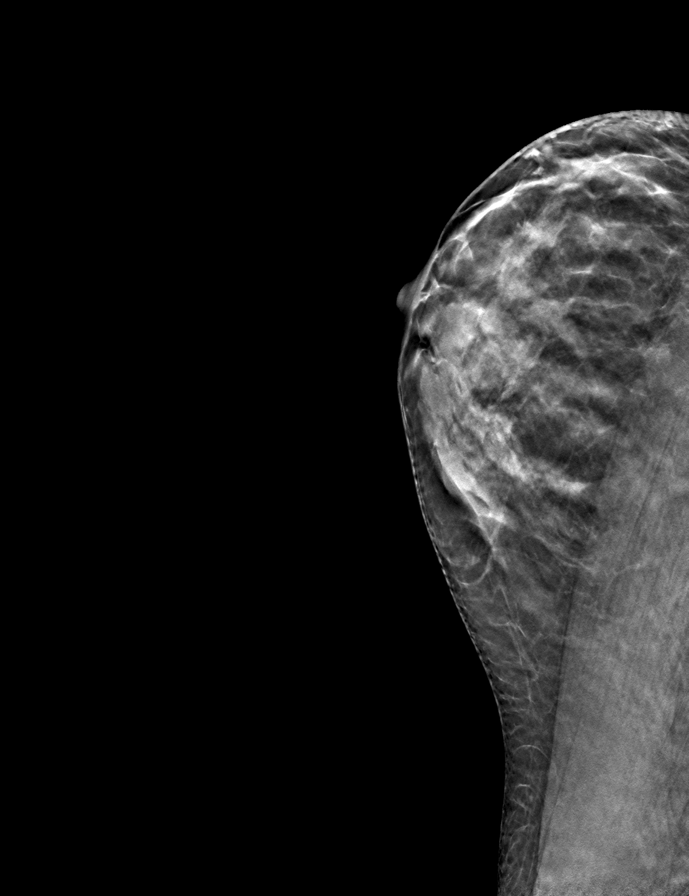

[R MLO tomo · tomo slice 21/42.0]
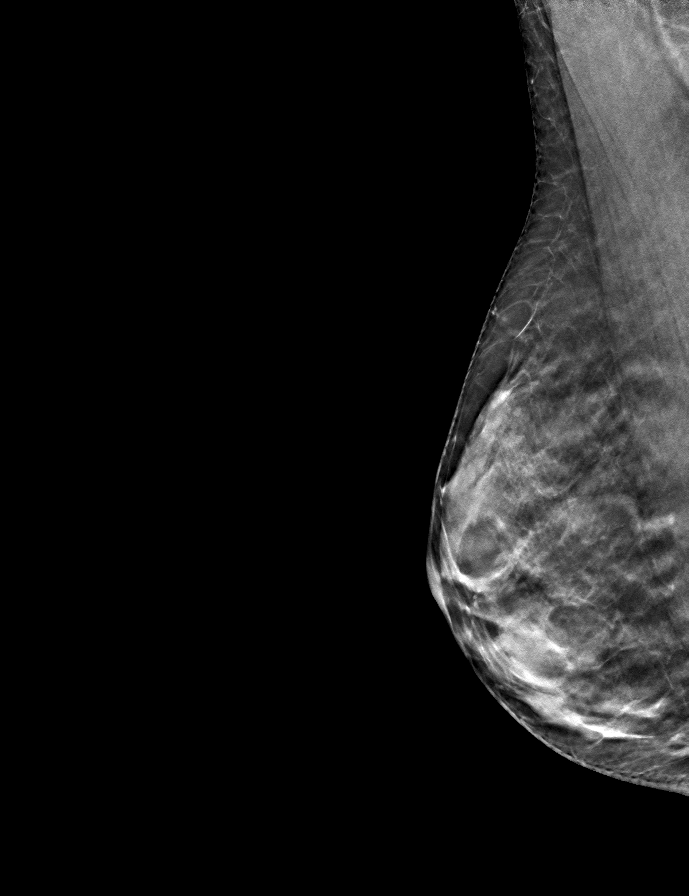

[9 of 24 positions shown; findings below may reference images not displayed]

ACR Breast Density Category c: The breast tissue is heterogeneously
dense, which may obscure small masses.
FINDINGS: There are no findings suspicious for malignancy.
IMPRESSION: No mammographic evidence of malignancy. A result letter of this
screening mammogram will be mailed directly to the patient.

RECOMMENDATION:
Screening mammogram in one year. (Code:Q3-W-BC3)

BI-RADS CATEGORY  1: Negative.

## 2022-06-29 ENCOUNTER — Encounter: Payer: Self-pay | Admitting: Certified Registered Nurse Anesthetist

## 2022-06-29 ENCOUNTER — Encounter: Payer: Self-pay | Admitting: Gastroenterology

## 2022-07-01 ENCOUNTER — Encounter: Payer: Self-pay | Admitting: Gastroenterology

## 2022-07-01 ENCOUNTER — Ambulatory Visit (AMBULATORY_SURGERY_CENTER): Payer: No Typology Code available for payment source | Admitting: Gastroenterology

## 2022-07-01 VITALS — BP 114/62 | HR 73 | Temp 96.9°F | Resp 10 | Ht 63.0 in | Wt 121.0 lb

## 2022-07-01 DIAGNOSIS — Z1211 Encounter for screening for malignant neoplasm of colon: Secondary | ICD-10-CM | POA: Diagnosis present

## 2022-07-01 MED ORDER — SODIUM CHLORIDE 0.9 % IV SOLN: 500.0000 mL | Freq: Once | INTRAVENOUS | Status: DC

## 2022-07-01 NOTE — Progress Notes (Signed)
VS completed by DT.  Pt's states no medical or surgical changes since previsit or office visit.  

## 2022-07-01 NOTE — Progress Notes (Signed)
[facility] History and Physical   Primary Care Physician:  Mosie Lukes, MD   Reason for Procedure:  Colorectal cancer screening  Plan:    Screening colonoscopy with possible interventions as needed     HPI: Sandra Long is a very pleasant 47 y.o. female here for screening colonoscopy. Denies any nausea, vomiting, abdominal pain, melena or bright red blood per rectum  The risks and benefits as well as alternatives of endoscopic procedure(s) have been discussed and reviewed. All questions answered. The patient agrees to proceed.    Past Medical History:  Diagnosis Date   Anemia 06/07/2016   Cervical disc disease 11/25/2014   MRI 2014  1.  Moderate left central and foraminal stenosis due to a large left lateral recess disc protrusion at C6-7, potentially with a small amount of marginal blood products - correlate with chronicity of onset of patient's symptoms. 2.  There is also moderate right eccentric central stenosis at the C5-6 level due to a right paracentral disc protrusion. 3.  Chronic ethmoid sinusitis. 4.  Mild    Dog bite 10/20/2016   Dyslipidemia 06/07/2016   Eye disease 12/07/2014   Macular rosacea   Gestational diabetes 2009   Hyperglycemia 06/07/2016   Palpitations 11/25/2014   Paresthesia 11/25/2014   Preventative health care 12/07/2014    Past Surgical History:  Procedure Laterality Date   CESAREAN SECTION  2007   CESAREAN SECTION N/A 09/08/2015   Procedure: Repeat CESAREAN SECTION;  Surgeon: Brien Few, MD;  Location: [facility];  Service: Obstetrics;  Laterality: N/A;  EDD: 09/14/15   WISDOM TOOTH EXTRACTION      Prior to Admission medications   Medication Sig Start Date End Date Taking? Authorizing Provider  lisdexamfetamine (VYVANSE) 30 MG capsule Take 1 capsule (30 mg total) by mouth daily. September 2023 02/24/22  Yes Mosie Lukes, MD  lisdexamfetamine (VYVANSE) 30 MG capsule Take 1 capsule (30 mg total) by mouth daily. June 2023 rx  12/23/21   Mosie Lukes, MD  lisdexamfetamine (VYVANSE) 30 MG capsule Take 1 capsule (30 mg total) by mouth daily. July 2023 12/23/21   Mosie Lukes, MD  valACYclovir (VALTREX) 1000 MG tablet Take 1,000 mg by mouth daily as needed. 05/24/22   [provider]    Current Outpatient Medications  Medication Sig Dispense Refill   lisdexamfetamine (VYVANSE) 30 MG capsule Take 1 capsule (30 mg total) by mouth daily. September 2023 30 capsule 0   lisdexamfetamine (VYVANSE) 30 MG capsule Take 1 capsule (30 mg total) by mouth daily. June 2023 rx 30 capsule 0   lisdexamfetamine (VYVANSE) 30 MG capsule Take 1 capsule (30 mg total) by mouth daily. July 2023 30 capsule 0   valACYclovir (VALTREX) 1000 MG tablet Take 1,000 mg by mouth daily as needed.     Current Facility-Administered Medications  Medication Dose Route Frequency Provider Last Rate Last Admin   0.9 %  sodium chloride infusion  500 mL Intravenous Once Mauri Pole, MD        Allergies as of 07/01/2022 - Review Complete 07/01/2022  Allergen Reaction Noted   No known allergies  01/12/2020    Family History  Problem Relation Age of Onset   Heart disease Mother        2 stents 2015   Tremor Mother        benign essential   Colon polyps Mother 64   Heart disease Father        stents  Diabetes Father        84   Stroke Father    Gout Father    Blindness Father    Gout Brother    Cancer Maternal Grandfather        Multiple Myeloma   Stroke Paternal Grandmother    Cancer Paternal Grandfather        bladder cancer   COPD Son    Esophageal cancer Neg Hx    Colon cancer Neg Hx    Rectal cancer Neg Hx    Stomach cancer Neg Hx     Social History   Socioeconomic History   Marital status: Married    Spouse name: Not on file   Number of children: Not on file   Years of education: Not on file   Highest education level: Not on file  Occupational History   Occupation: RN [employer]  Tobacco Use   Smoking status: Never   Smokeless tobacco: Never  Vaping Use   Vaping Use: Never used  Substance and Sexual Activity   Alcohol use: Yes    Alcohol/week: 4.0 standard drinks of alcohol    Types: 4 Glasses of wine per week   Drug use: No   Sexual activity: Yes    Comment: lives with husband, no red meat, works as Therapist, sports at [employer]  Other Topics Concern   Not on file  Social History Narrative   Not on file   Social Determinants of Health   Financial Resource Strain: Not on file  Food Insecurity: Not on file  Transportation Needs: Not on file  Physical Activity: Not on file  Stress: Not on file  Social Connections: Not on file  Intimate Partner Violence: Not on file    Review of Systems:  All other review of systems negative except as mentioned in the HPI.  Physical Exam: Vital signs in last 24 hours: Blood Pressure 100/67   Pulse 81   Temperature (Abnormal) 96.9 F (36.1 C) (Temporal)   Respiration 16   Height '5\' 3"'$  (1.6 m)   Weight 121 lb (54.9 kg)   Oxygen Saturation 100%   Body Mass Index 21.43 kg/m  General:   Alert, NAD Lungs:  Clear .   Heart:  Regular rate and rhythm Abdomen:  Soft, nontender and nondistended. Neuro/Psych:  Alert and cooperative. Normal mood and affect. A and O x 3  Reviewed labs, radiology imaging, old records and pertinent past GI work up  Patient is appropriate for planned procedure(s) and anesthesia in an ambulatory setting   K. Denzil Magnuson , MD [phone]

## 2022-07-01 NOTE — Op Note (Signed)
[facility] Patient Name: Sandra Long Procedure Date: 07/01/2022 7:34 AM MRN: [id number] Endoscopist: Mauri Pole , MD, [id number] Age: 47 Referring MD:  Date of Birth: 1976/05/28 Gender: Female Account #: [id number] Procedure:                Colonoscopy Indications:              Screening for colorectal malignant neoplasm Medicines:                Monitored Anesthesia Care Procedure:                Pre-Anesthesia Assessment:                           - Prior to the procedure, a History and Physical                            was performed, and patient medications and                            allergies were reviewed. The patient's tolerance of                            previous anesthesia was also reviewed. The risks                            and benefits of the procedure and the sedation                            options and risks were discussed with the patient.                            All questions were answered, and informed consent                            was obtained. Prior Anticoagulants: The patient has                            taken no anticoagulant or antiplatelet agents. ASA                            Grade Assessment: I - A normal, healthy patient.                            After reviewing the risks and benefits, the patient                            was deemed in satisfactory condition to undergo the                            procedure.                           After obtaining informed consent, the colonoscope  was passed under direct vision. Throughout the                            procedure, the patient's blood pressure, pulse, and                            oxygen saturations were monitored continuously. The                            [id number]-HQ190L Colonoscope was introduced through the                            anus and advanced to the the cecum, identified by                            appendiceal orifice  and ileocecal valve. The                            colonoscopy was performed without difficulty. The                            patient tolerated the procedure well. The quality                            of the bowel preparation was excellent. The                            ileocecal valve, appendiceal orifice, and rectum                            were photographed. Scope In: 8:30:32 AM Scope Out: 8:46:52 AM Scope Withdrawal Time: 0 hours 11 minutes 40 seconds  Total Procedure Duration: 0 hours 16 minutes 20 seconds  Findings:                 The perianal and digital rectal examinations were                            normal.                           Non-bleeding internal hemorrhoids were found during                            retroflexion. The hemorrhoids were medium-sized.                           The exam was otherwise without abnormality. Complications:            No immediate complications. Estimated Blood Loss:     Estimated blood loss: none. Impression:               - Non-bleeding internal hemorrhoids.                           - The examination was otherwise normal.                           -  No specimens collected. Recommendation:           - Patient has a contact number available for                            emergencies. The signs and symptoms of potential                            delayed complications were discussed with the                            patient. Return to normal activities tomorrow.                            Written discharge instructions were provided to the                            patient.                           - Resume previous diet.                           - Continue present medications.                           - Repeat colonoscopy in 10 years for surveillance. Mauri Pole, MD 07/01/2022 8:52:54 AM This report has been signed electronically.

## 2022-07-01 NOTE — Patient Instructions (Signed)
Thank you for coming in to see [place] today! Resume your diet and medications/supplements today. Return to regular daily activities tomorrow.  YOU HAD AN ENDOSCOPIC PROCEDURE TODAY AT [facility]:   Refer to the procedure report that was given to you for any specific questions about what was found during the examination.  If the procedure report does not answer your questions, please call your gastroenterologist to clarify.  If you requested that your care partner not be given the details of your procedure findings, then the procedure report has been included in a sealed envelope for you to review at your convenience later.  YOU SHOULD EXPECT: Some feelings of bloating in the abdomen. Passage of more gas than usual.  Walking can help get rid of the air that was put into your GI tract during the procedure and reduce the bloating. If you had a lower endoscopy (such as a colonoscopy or flexible sigmoidoscopy) you may notice spotting of blood in your stool or on the toilet paper. If you underwent a bowel prep for your procedure, you may not have a normal bowel movement for a few days.  Please Note:  You might notice some irritation and congestion in your nose or some drainage.  This is from the oxygen used during your procedure.  There is no need for concern and it should clear up in a day or so.  SYMPTOMS TO REPORT IMMEDIATELY:  Following lower endoscopy (colonoscopy or flexible sigmoidoscopy):  Excessive amounts of blood in the stool  Significant tenderness or worsening of abdominal pains  Swelling of the abdomen that is new, acute  Fever of 100F or higher  For urgent or emergent issues, a gastroenterologist can be reached at any hour by calling [phone]. Do not use MyChart messaging for urgent concerns.    DIET:  We do recommend a small meal at first, but then you may proceed to your regular diet.  Drink plenty of fluids but you should avoid alcoholic beverages for 24  hours.  ACTIVITY:  You should plan to take it easy for the rest of today and you should NOT DRIVE or use heavy machinery until tomorrow (because of the sedation medicines used during the test).    FOLLOW UP: Our staff will call the number listed on your records the next business day following your procedure.  We will call around 7:15- 8:00 am to check on you and address any questions or concerns that you may have regarding the information given to you following your procedure. If we do not reach you, we will leave a message.     If any biopsies were taken you will be contacted by phone or by letter within the next 1-3 weeks.  Please call us at [phone] if you have not heard about the biopsies in 3 weeks.    SIGNATURES/CONFIDENTIALITY: You and/or your care partner have signed paperwork which will be entered into your electronic medical record.  These signatures attest to the fact that that the information above on your After Visit Summary has been reviewed and is understood.  Full responsibility of the confidentiality of this discharge information lies with you and/or your care-partner.

## 2022-07-04 ENCOUNTER — Telehealth: Payer: Self-pay | Admitting: *Deleted

## 2022-07-04 NOTE — Telephone Encounter (Signed)
  Follow up Call-     07/01/2022    7:44 AM  Call back number  Post procedure Call Back phone  # [phone]  Permission to leave phone message Yes     Patient questions:  Do you have a fever, pain , or abdominal swelling? No. Pain Score  0 *  Have you tolerated food without any problems? Yes.    Have you been able to return to your normal activities? Yes.    Do you have any questions about your discharge instructions: Diet   No. Medications  No. Follow up visit  No.  Do you have questions or concerns about your Care? No.  Actions: * If pain score is 4 or above: No action needed, pain <4.

## 2022-07-11 ENCOUNTER — Ambulatory Visit (INDEPENDENT_AMBULATORY_CARE_PROVIDER_SITE_OTHER): Payer: No Typology Code available for payment source | Admitting: Family Medicine

## 2022-07-11 VITALS — BP 100/69 | HR 65 | Temp 97.9°F | Resp 16 | Ht 63.0 in | Wt 118.0 lb

## 2022-07-11 DIAGNOSIS — Z0000 Encounter for general adult medical examination without abnormal findings: Secondary | ICD-10-CM | POA: Diagnosis not present

## 2022-07-11 DIAGNOSIS — F419 Anxiety disorder, unspecified: Secondary | ICD-10-CM

## 2022-07-11 DIAGNOSIS — F988 Other specified behavioral and emotional disorders with onset usually occurring in childhood and adolescence: Secondary | ICD-10-CM

## 2022-07-11 DIAGNOSIS — R739 Hyperglycemia, unspecified: Secondary | ICD-10-CM | POA: Diagnosis not present

## 2022-07-11 DIAGNOSIS — Z79899 Other long term (current) drug therapy: Secondary | ICD-10-CM

## 2022-07-11 DIAGNOSIS — G4700 Insomnia, unspecified: Secondary | ICD-10-CM | POA: Diagnosis not present

## 2022-07-11 DIAGNOSIS — E785 Hyperlipidemia, unspecified: Secondary | ICD-10-CM

## 2022-07-11 LAB — CBC WITH DIFFERENTIAL/PLATELET
Basophils Absolute: 0 10*3/uL (ref 0.0–0.1)
Basophils Relative: 0.5 % (ref 0.0–3.0)
Eosinophils Absolute: 0.4 10*3/uL (ref 0.0–0.7)
Eosinophils Relative: 6.6 % — ABNORMAL HIGH (ref 0.0–5.0)
HCT: 35.9 % — ABNORMAL LOW (ref 36.0–46.0)
Hemoglobin: 12.1 g/dL (ref 12.0–15.0)
Lymphocytes Relative: 31.8 % (ref 12.0–46.0)
Lymphs Abs: 1.8 10*3/uL (ref 0.7–4.0)
MCHC: 33.6 g/dL (ref 30.0–36.0)
MCV: 88.9 fl (ref 78.0–100.0)
Monocytes Absolute: 0.4 10*3/uL (ref 0.1–1.0)
Monocytes Relative: 7.9 % (ref 3.0–12.0)
Neutro Abs: 3 10*3/uL (ref 1.4–7.7)
Neutrophils Relative %: 53.2 % (ref 43.0–77.0)
Platelets: 238 10*3/uL (ref 150.0–400.0)
RBC: 4.04 Mil/uL (ref 3.87–5.11)
RDW: 15.2 % (ref 11.5–15.5)
WBC: 5.6 10*3/uL (ref 4.0–10.5)

## 2022-07-11 LAB — LIPID PANEL
Cholesterol: 130 mg/dL (ref 0–200)
HDL: 44.2 mg/dL (ref >39.00)
LDL Cholesterol: 80 mg/dL (ref 0–99)
NonHDL: 86.13
Total CHOL/HDL Ratio: 3
Triglycerides: 33 mg/dL (ref 0.0–149.0)
VLDL: 6.6 mg/dL (ref 0.0–40.0)

## 2022-07-11 LAB — COMPREHENSIVE METABOLIC PANEL
ALT: 17 U/L (ref 0–35)
AST: 20 U/L (ref 0–37)
Albumin: 4.2 g/dL (ref 3.5–5.2)
Alkaline Phosphatase: 37 U/L — ABNORMAL LOW (ref 39–117)
BUN: 8 mg/dL (ref 6–23)
CO2: 26 mEq/L (ref 19–32)
Calcium: 9 mg/dL (ref 8.4–10.5)
Chloride: 105 mEq/L (ref 96–112)
Creatinine, Ser: 0.5 mg/dL (ref 0.40–1.20)
GFR: 112.77 mL/min (ref >60.00)
Glucose, Bld: 95 mg/dL (ref 70–99)
Potassium: 4.7 mEq/L (ref 3.5–5.1)
Sodium: 137 mEq/L (ref 135–145)
Total Bilirubin: 0.4 mg/dL (ref 0.2–1.2)
Total Protein: 6.4 g/dL (ref 6.0–8.3)

## 2022-07-11 LAB — HEMOGLOBIN A1C: Hgb A1c MFr Bld: 6 % (ref 4.6–6.5)

## 2022-07-11 LAB — TSH: TSH: 1.91 u[IU]/mL (ref 0.35–5.50)

## 2022-07-11 LAB — CORTISOL: Cortisol, Plasma: 5.9 ug/dL

## 2022-07-11 MED ORDER — LISDEXAMFETAMINE DIMESYLATE 30 MG PO CAPS: 30.0000 mg | ORAL_CAPSULE | Freq: Every day | ORAL | 0 refills | Status: DC

## 2022-07-11 MED ORDER — AMITRIPTYLINE HCL 10 MG PO TABS: 5.0000 mg | ORAL_TABLET | Freq: Every day | ORAL | 2 refills | Status: DC

## 2022-07-11 NOTE — Assessment & Plan Note (Addendum)
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 The move has helped her anxiety to some degree

## 2022-07-11 NOTE — Assessment & Plan Note (Addendum)
Patient encouraged to maintain heart healthy diet, regular exercise, adequate sleep. Consider daily probiotics. Take medications as prescribed.. Labs ordered and reviewed. Last MGM in 2022 repeat this year. Colonoscopy 06/2022 repeat in 10 years. Pap May 2021

## 2022-07-11 NOTE — Assessment & Plan Note (Signed)
hgba1c acceptable, minimize simple carbs. Increase exercise as tolerated.  

## 2022-07-11 NOTE — Progress Notes (Signed)
Subjective:   By signing my name below, I, Marlana Latus, attest that this documentation has been prepared under the direction and in the presence of Mosie Lukes MD 07/11/2022   Patient ID: Sandra Long, female    DOB: [birth year], 47 y.o.   MRN: [id number]  Chief Complaint  Patient presents with   Annual Exam    Annual Exam    HPI Patient is in today for a comprehensive physical exam.  She has been taking Vyvance and states that it helps significantly, though she notes that it affects her appetite. She doesn't tend to take it everyday.  She believes that her anxiety and stress has improved. Her medication has seemed to helped. She notes that she occasionally has days where she is anxious and stressed related to procrastination. She is also planning to find a therapist soon.  She has not been sleeping as well. She has been taking a melatonin pill from [employer] to help. We discussed possible medications to help with sleep and anxiety.   She has not been staying as active and states she tends to have a pretty sedentary life from day to day. For work, she is mainly sitting and typing most of her day.   Her appetite has been well, and even when her appetite is low, she tends to still make sure to eat well.   Last mammogram: 06/01/2021. Results were: There are no findings suspicious for malignancy.  Last pap: 11/05/2019. Results were normal.   Last colonoscopy: 07/01/2022. Results were: Non-bleeding internal hemorrhoids. The examination was otherwise normal. No specimens collected.  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  She is planning to schedule a hearing test soon.   She denies having any fever, new muscle pain, new joint pain, new moles, congestion, sinus pain, sore throat, chest pain, palpitations, cough, SOB, wheezing, n/v/d, constipation, blood in stool, dysuria, frequency, hematuria, or headaches at this time.   Past Medical History:  Diagnosis Date   Anemia  06/07/2016   Cervical disc disease 11/25/2014   MRI 2014  1.  Moderate left central and foraminal stenosis due to a large left lateral recess disc protrusion at C6-7, potentially with a small amount of marginal blood products - correlate with chronicity of onset of patient's symptoms. 2.  There is also moderate right eccentric central stenosis at the C5-6 level due to a right paracentral disc protrusion. 3.  Chronic ethmoid sinusitis. 4.  Mild    Dog bite 10/20/2016   Dyslipidemia 06/07/2016   Eye disease 12/07/2014   Macular rosacea   Gestational diabetes 2009   Hyperglycemia 06/07/2016   Palpitations 11/25/2014   Paresthesia 11/25/2014   Preventative health care 12/07/2014    Past Surgical History:  Procedure Laterality Date   CESAREAN SECTION  2007   CESAREAN SECTION N/A 09/08/2015   Procedure: Repeat CESAREAN SECTION;  Surgeon: Brien Few, MD;  Location: [facility];  Service: Obstetrics;  Laterality: N/A;  EDD: 09/14/15   WISDOM TOOTH EXTRACTION      Family History  Problem Relation Age of Onset   Heart disease Mother        2 stents 2015   Tremor Mother        benign essential   Colon polyps Mother 73   Heart disease Father        stents   Diabetes Father        22   Stroke Father    Gout Father    Blindness Father  Gout Brother    Cancer Maternal Grandfather        Multiple Myeloma   Stroke Paternal Grandmother    Cancer Paternal Grandfather        bladder cancer   COPD Son    Esophageal cancer Neg Hx    Colon cancer Neg Hx    Rectal cancer Neg Hx    Stomach cancer Neg Hx     Social History   Socioeconomic History   Marital status: Married    Spouse name: Not on file   Number of children: Not on file   Years of education: Not on file   Highest education level: Not on file  Occupational History   Occupation: RN [employer]  Tobacco Use   Smoking status: Never   Smokeless tobacco: Never  Vaping Use   Vaping Use:  Never used  Substance and Sexual Activity   Alcohol use: Yes    Alcohol/week: 4.0 standard drinks of alcohol    Types: 4 Glasses of wine per week   Drug use: No   Sexual activity: Yes    Comment: lives with husband, no red meat, works as Therapist, sports at [employer]  Other Topics Concern   Not on file  Social History Narrative   Not on file   Social Determinants of Health   Financial Resource Strain: Not on file  Food Insecurity: Not on file  Transportation Needs: Not on file  Physical Activity: Not on file  Stress: Not on file  Social Connections: Not on file  Intimate Partner Violence: Not on file    Outpatient Medications Prior to Visit  Medication Sig Dispense Refill   valACYclovir (VALTREX) 1000 MG tablet Take 1,000 mg by mouth daily as needed.     lisdexamfetamine (VYVANSE) 30 MG capsule Take 1 capsule (30 mg total) by mouth daily. June 2023 rx 30 capsule 0   lisdexamfetamine (VYVANSE) 30 MG capsule Take 1 capsule (30 mg total) by mouth daily. July 2023 30 capsule 0   lisdexamfetamine (VYVANSE) 30 MG capsule Take 1 capsule (30 mg total) by mouth daily. September 2023 30 capsule 0   No facility-administered medications prior to visit.    Allergies  Allergen Reactions   No Known Allergies     Review of Systems  Psychiatric/Behavioral:  The patient is nervous/anxious and has insomnia (having trouble sleeping (may be associated with vyvance)).        Objective:    Physical Exam Constitutional:      General: She is not in acute distress.    Appearance: Normal appearance.  HENT:     Head: Normocephalic and atraumatic.     Right Ear: Tympanic membrane, ear canal and external ear normal.     Left Ear: Tympanic membrane, ear canal and external ear normal.  Eyes:     Extraocular Movements: Extraocular movements intact.     Pupils: Pupils are equal, round, and reactive to light.  Cardiovascular:     Rate and Rhythm: Normal rate and regular rhythm.     Pulses: Normal pulses.      Heart sounds: Normal heart sounds. No murmur heard.    No gallop.  Pulmonary:     Effort: Pulmonary effort is normal. No respiratory distress.     Breath sounds: Normal breath sounds. No wheezing.  Abdominal:     General: Abdomen is flat.     Palpations: Abdomen is soft. There is no mass.     Tenderness: There is  no guarding.  Musculoskeletal:     Right lower leg: No edema.     Left lower leg: No edema.  Skin:    General: Skin is warm and dry.  Neurological:     General: No focal deficit present.     Mental Status: She is alert and oriented to person, place, and time.     Cranial Nerves: No cranial nerve deficit.     Coordination: Coordination normal.  Psychiatric:        Judgment: Judgment normal.     BP 100/69 (BP Location: Right Arm, Patient Position: Sitting, Cuff Size: Normal)   Pulse 65   Temp 97.9 F (36.6 C) (Oral)   Resp 16   Ht '5\' 3"'$  (1.6 m)   Wt 118 lb (53.5 kg)   SpO2 99%   BMI 20.90 kg/m  Wt Readings from Last 3 Encounters:  07/11/22 118 lb (53.5 kg)  07/01/22 121 lb (54.9 kg)  05/27/22 121 lb (54.9 kg)    Diabetic Foot Exam - Simple   No data filed    Lab Results  Component Value Date   WBC 5.6 07/11/2022   HGB 12.1 07/11/2022   HCT 35.9 (L) 07/11/2022   PLT 238.0 07/11/2022   GLUCOSE 95 07/11/2022   CHOL 130 07/11/2022   TRIG 33.0 07/11/2022   HDL 44.20 07/11/2022   LDLCALC 80 07/11/2022   ALT 17 07/11/2022   AST 20 07/11/2022   NA 137 07/11/2022   K 4.7 07/11/2022   CL 105 07/11/2022   CREATININE 0.50 07/11/2022   BUN 8 07/11/2022   CO2 26 07/11/2022   TSH 1.91 07/11/2022   HGBA1C 6.0 07/11/2022    Lab Results  Component Value Date   TSH 1.91 07/11/2022   Lab Results  Component Value Date   WBC 5.6 07/11/2022   HGB 12.1 07/11/2022   HCT 35.9 (L) 07/11/2022   MCV 88.9 07/11/2022   PLT 238.0 07/11/2022   Lab Results  Component Value Date   NA 137 07/11/2022   K 4.7 07/11/2022   CO2 26 07/11/2022   GLUCOSE 95 07/11/2022    BUN 8 07/11/2022   CREATININE 0.50 07/11/2022   BILITOT 0.4 07/11/2022   ALKPHOS 37 (L) 07/11/2022   AST 20 07/11/2022   ALT 17 07/11/2022   PROT 6.4 07/11/2022   ALBUMIN 4.2 07/11/2022   CALCIUM 9.0 07/11/2022   GFR 112.77 07/11/2022   Lab Results  Component Value Date   CHOL 130 07/11/2022   Lab Results  Component Value Date   HDL 44.20 07/11/2022   Lab Results  Component Value Date   LDLCALC 80 07/11/2022   Lab Results  Component Value Date   TRIG 33.0 07/11/2022   Lab Results  Component Value Date   CHOLHDL 3 07/11/2022   Lab Results  Component Value Date   HGBA1C 6.0 07/11/2022       Assessment & Plan:   Problem List Items Addressed This Visit     Preventative health care    Patient encouraged to maintain heart healthy diet, regular exercise, adequate sleep. Consider daily probiotics. Take medications as prescribed.. Labs ordered and reviewed. Last MGM in 2022 repeat this year. Colonoscopy 06/2022 repeat in 10 years. Pap May 2021       Relevant Orders   CBC with Differential/Platelet (Completed)   Hyperglycemia - Primary    hgba1c acceptable, minimize simple carbs. Increase exercise as tolerated.       Relevant Orders   Hemoglobin  A1c (Completed)   CBC with Differential/Platelet (Completed)   Comprehensive metabolic panel (Completed)   Lipid panel (Completed)   TSH (Completed)   Insulin, random (Completed)   Cortisol (Completed)   Dyslipidemia    Encourage heart healthy diet such as MIND or DASH diet, increase exercise, avoid trans fats, simple carbohydrates and processed foods, consider a krill or fish or flaxseed oil cap daily.       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 The move has helped her anxiety to some degree      Relevant Medications   amitriptyline (ELAVIL) 10 MG tablet   Other Relevant Orders   CBC with Differential/Platelet (Completed)   Insulin, random (Completed)   Drug Monitoring Panel [phone] , Urine (Completed)   Insomnia    Amitriptyline 10 to 20 mg qhs prn or daily        Relevant Orders   Drug Monitoring Panel [phone] , Urine (Completed)   Other Visit Diagnoses     High risk medication use       Relevant Orders   Drug Monitoring Panel [phone] , Urine (Completed)      Meds ordered this encounter  Medications   lisdexamfetamine (VYVANSE) 30 MG capsule    Sig: Take 1 capsule (30 mg total) by mouth daily. January 2024 rx    Dispense:  30 capsule    Refill:  0   lisdexamfetamine (VYVANSE) 30 MG capsule    Sig: Take 1 capsule (30 mg total) by mouth daily. February 2024 rx    Dispense:  30 capsule    Refill:  0   lisdexamfetamine (VYVANSE) 30 MG capsule    Sig: Take 1 capsule (30 mg total) by mouth daily. March 2024 rx    Dispense:  30 capsule    Refill:  0   amitriptyline (ELAVIL) 10 MG tablet    Sig: Take 0.5-2 tablets (5-20 mg total) by mouth at bedtime.    Dispense:  60 tablet    Refill:  2    I, Penni Homans, MD, personally preformed the services described in this documentation.  All medical record entries made by the scribe were at my direction and in my presence.  I have reviewed the chart and discharge instructions (if applicable) and agree that the record reflects my personal performance and is accurate and complete. 07/13/22  I,Rachel Rivera,acting as a scribe for Penni Homans, MD.,have documented all relevant documentation on the behalf of Penni Homans, MD,as directed by  Penni Homans, MD while in the presence of Penni Homans, MD.     Penni Homans, MD

## 2022-07-11 NOTE — Assessment & Plan Note (Addendum)
Vyvanse helps with her work but it does affect her sleep

## 2022-07-11 NOTE — Assessment & Plan Note (Signed)
Amitriptyline 10 to 20 mg qhs prn or daily

## 2022-07-11 NOTE — Patient Instructions (Addendum)
Magnesium Glycinate 200-400 mg [employer] website Psychology Today website for counselor  Preventive Care 32-47 Years Old, Female Preventive care refers to lifestyle choices and visits with your health care provider that can promote health and wellness. Preventive care visits are also called wellness exams. What can I expect for my preventive care visit? Counseling Your health care provider may ask you questions about your: Medical history, including: Past medical problems. Family medical history. Pregnancy history. Current health, including: Menstrual cycle. Method of birth control. Emotional well-being. Home life and relationship well-being. Sexual activity and sexual health. Lifestyle, including: Alcohol, nicotine or tobacco, and drug use. Access to firearms. Diet, exercise, and sleep habits. Work and work Statistician. Sunscreen use. Safety issues such as seatbelt and bike helmet use. Physical exam Your health care provider will check your: Height and weight. These may be used to calculate your BMI (body mass index). BMI is a measurement that tells if you are at a healthy weight. Waist circumference. This measures the distance around your waistline. This measurement also tells if you are at a healthy weight and may help predict your risk of certain diseases, such as type 2 diabetes and high blood pressure. Heart rate and blood pressure. Body temperature. Skin for abnormal spots. What immunizations do I need?  Vaccines are usually given at various ages, according to a schedule. Your health care provider will recommend vaccines for you based on your age, medical history, and lifestyle or other factors, such as travel or where you work. What tests do I need? Screening Your health care provider may recommend screening tests for certain conditions. This may include: Lipid and cholesterol levels. Diabetes screening. This is done by checking your blood sugar (glucose) after you  have not eaten for a while (fasting). Pelvic exam and Pap test. Hepatitis B test. Hepatitis C test. HIV (human immunodeficiency virus) test. STI (sexually transmitted infection) testing, if you are at risk. Lung cancer screening. Colorectal cancer screening. Mammogram. Talk with your health care provider about when you should start having regular mammograms. This may depend on whether you have a family history of breast cancer. BRCA-related cancer screening. This may be done if you have a family history of breast, ovarian, tubal, or peritoneal cancers. Bone density scan. This is done to screen for osteoporosis. Talk with your health care provider about your test results, treatment options, and if necessary, the need for more tests. Follow these instructions at home: Eating and drinking  Eat a diet that includes fresh fruits and vegetables, whole grains, lean protein, and low-fat dairy products. Take vitamin and mineral supplements as recommended by your health care provider. Do not drink alcohol if: Your health care provider tells you not to drink. You are pregnant, may be pregnant, or are planning to become pregnant. If you drink alcohol: Limit how much you have to 0-1 drink a day. Know how much alcohol is in your drink. In the [place] one drink equals one 12 oz bottle of beer (355 mL), one 5 oz glass of wine (148 mL), or one 1 oz glass of hard liquor (44 mL). Lifestyle Brush your teeth every morning and night with fluoride toothpaste. Floss one time each day. Exercise for at least 30 minutes 5 or more days each week. Do not use any products that contain nicotine or tobacco. These products include cigarettes, chewing tobacco, and vaping devices, such as e-cigarettes. If you need help quitting, ask your health care provider. Do not use drugs. If you are sexually active,  practice safe sex. Use a condom or other form of protection to prevent STIs. If you do not wish to become pregnant, use  a form of birth control. If you plan to become pregnant, see your health care provider for a prepregnancy visit. Take aspirin only as told by your health care provider. Make sure that you understand how much to take and what form to take. Work with your health care provider to find out whether it is safe and beneficial for you to take aspirin daily. Find healthy ways to manage stress, such as: Meditation, yoga, or listening to music. Journaling. Talking to a trusted person. Spending time with friends and family. Minimize exposure to UV radiation to reduce your risk of skin cancer. Safety Always wear your seat belt while driving or riding in a vehicle. Do not drive: If you have been drinking alcohol. Do not ride with someone who has been drinking. When you are tired or distracted. While texting. If you have been using any mind-altering substances or drugs. Wear a helmet and other protective equipment during sports activities. If you have firearms in your house, make sure you follow all gun safety procedures. Seek help if you have been physically or sexually abused. What's next? Visit your health care provider once a year for an annual wellness visit. Ask your health care provider how often you should have your eyes and teeth checked. Stay up to date on all vaccines. This information is not intended to replace advice given to you by your health care provider. Make sure you discuss any questions you have with your health care provider. Document Revised: 12/09/2020 Document Reviewed: 12/09/2020 Elsevier Patient Education  Sandra Long.

## 2022-07-12 LAB — INSULIN, RANDOM: Insulin: 5.2 u[IU]/mL

## 2022-07-13 LAB — DRUG MONITORING PANEL 376104, URINE
Amphetamines: NEGATIVE ng/mL (ref <500)
Barbiturates: NEGATIVE ng/mL (ref <300)
Benzodiazepines: NEGATIVE ng/mL (ref <100)
Cocaine Metabolite: NEGATIVE ng/mL (ref <150)
Desmethyltramadol: NEGATIVE ng/mL (ref <100)
Opiates: NEGATIVE ng/mL (ref <100)
Oxycodone: NEGATIVE ng/mL (ref <100)
Tramadol: NEGATIVE ng/mL (ref <100)

## 2022-07-13 LAB — DM TEMPLATE

## 2022-07-13 NOTE — Assessment & Plan Note (Signed)
Encourage heart healthy diet such as MIND or DASH diet, increase exercise, avoid trans fats, simple carbohydrates and processed foods, consider a krill or fish or flaxseed oil cap daily.

## 2022-07-14 ENCOUNTER — Encounter: Payer: Self-pay | Admitting: Family Medicine

## 2022-07-15 ENCOUNTER — Other Ambulatory Visit: Payer: Self-pay | Admitting: Family Medicine

## 2022-07-15 ENCOUNTER — Other Ambulatory Visit (HOSPITAL_BASED_OUTPATIENT_CLINIC_OR_DEPARTMENT_OTHER): Payer: Self-pay

## 2022-07-15 MED ORDER — LISDEXAMFETAMINE DIMESYLATE 30 MG PO CAPS
30.0000 mg | ORAL_CAPSULE | Freq: Every day | ORAL | 0 refills | Status: DC
  Filled 2022-07-15 – 2022-07-27 (×2): qty 30, 30d supply, fill #0

## 2022-07-15 NOTE — Telephone Encounter (Signed)
Pt called stating that the pharmacy downstairs does have the vyvanse and was looking into getting the script sent over to them when possible.

## 2022-07-27 ENCOUNTER — Other Ambulatory Visit (HOSPITAL_BASED_OUTPATIENT_CLINIC_OR_DEPARTMENT_OTHER): Payer: Self-pay

## 2022-08-05 ENCOUNTER — Other Ambulatory Visit: Payer: Self-pay | Admitting: Family Medicine

## 2022-09-06 ENCOUNTER — Other Ambulatory Visit: Payer: Self-pay

## 2022-09-06 ENCOUNTER — Telehealth: Payer: Self-pay | Admitting: Family Medicine

## 2022-09-06 MED ORDER — VALACYCLOVIR HCL 1 G PO TABS: 1000.0000 mg | ORAL_TABLET | Freq: Every day | ORAL | 2 refills | Status: DC | PRN

## 2022-09-06 NOTE — Telephone Encounter (Signed)
Prescription Request  09/06/2022  Is this a "Controlled Substance" medicine? Yes  LOV: 07/11/2022  What is the name of the medication or equipment?   valACYclovir (VALTREX) 1000 MG tablet [id number]   Have you contacted your pharmacy to request a refill? No   Which pharmacy would you like this sent to?   [id number] [place], [place] - [address] [place] [id number]: [phone]: [phone] Patient notified that their request is being sent to the clinical staff for review and that they should receive a response within 2 business days.   Please advise at Mobile [phone](mobile)

## 2022-09-28 NOTE — Assessment & Plan Note (Addendum)
Vyvanse 30 mg refills given

## 2022-09-28 NOTE — Assessment & Plan Note (Signed)
hgba1c acceptable, minimize simple carbs. Increase exercise as tolerated.  

## 2022-09-28 NOTE — Assessment & Plan Note (Signed)
Encouraged good sleep hygiene such as dark, quiet room. No blue/green glowing lights such as computer screens in bedroom. No alcohol or stimulants in evening. Cut down on caffeine as able. Regular exercise is helpful but not just prior to bed time.  Using Elavil prn

## 2022-09-28 NOTE — Assessment & Plan Note (Signed)
Encourage heart healthy diet such as MIND or DASH diet, increase exercise, avoid trans fats, simple carbohydrates and processed foods, consider a krill or fish or flaxseed oil cap daily.  °

## 2022-09-29 ENCOUNTER — Telehealth (INDEPENDENT_AMBULATORY_CARE_PROVIDER_SITE_OTHER): Payer: No Typology Code available for payment source | Admitting: Family Medicine

## 2022-09-29 ENCOUNTER — Other Ambulatory Visit (HOSPITAL_BASED_OUTPATIENT_CLINIC_OR_DEPARTMENT_OTHER): Payer: Self-pay

## 2022-09-29 DIAGNOSIS — B001 Herpesviral vesicular dermatitis: Secondary | ICD-10-CM

## 2022-09-29 DIAGNOSIS — F419 Anxiety disorder, unspecified: Secondary | ICD-10-CM

## 2022-09-29 DIAGNOSIS — F988 Other specified behavioral and emotional disorders with onset usually occurring in childhood and adolescence: Secondary | ICD-10-CM

## 2022-09-29 DIAGNOSIS — R739 Hyperglycemia, unspecified: Secondary | ICD-10-CM

## 2022-09-29 DIAGNOSIS — E785 Hyperlipidemia, unspecified: Secondary | ICD-10-CM | POA: Diagnosis not present

## 2022-09-29 DIAGNOSIS — G4700 Insomnia, unspecified: Secondary | ICD-10-CM

## 2022-09-29 MED ORDER — VALACYCLOVIR HCL 1 G PO TABS
1000.0000 mg | ORAL_TABLET | Freq: Every day | ORAL | 2 refills | Status: DC | PRN
  Filled 2022-09-29: qty 30, 30d supply, fill #0

## 2022-09-29 MED ORDER — LISDEXAMFETAMINE DIMESYLATE 30 MG PO CAPS
30.0000 mg | ORAL_CAPSULE | Freq: Every day | ORAL | 0 refills | Status: DC
  Filled 2022-09-29: qty 30, 30d supply, fill #0

## 2022-09-29 MED ORDER — LISDEXAMFETAMINE DIMESYLATE 30 MG PO CAPS
30.0000 mg | ORAL_CAPSULE | Freq: Every day | ORAL | 0 refills | Status: DC
  Filled 2022-09-29 – 2022-11-04 (×2): qty 30, 30d supply, fill #0

## 2022-09-29 NOTE — Assessment & Plan Note (Signed)
She is struggling with stress as her 47 year old son, a Paramedic and he is not managing the school grades. She is trying to manage by talking with her son more

## 2022-10-02 ENCOUNTER — Encounter: Payer: Self-pay | Admitting: Family Medicine

## 2022-10-02 NOTE — Assessment & Plan Note (Signed)
Given refills on Valcyclovir

## 2022-10-02 NOTE — Progress Notes (Signed)
MyChart Video Visit    Virtual Visit via Video Note   This visit type was conducted due to national recommendations for restrictions regarding the COVID-19 Pandemic (e.g. social distancing) in an effort to limit this patient's exposure and mitigate transmission in our community. This patient is at least at moderate risk for complications without adequate follow up. This format is felt to be most appropriate for this patient at this time. Physical exam was limited by quality of the video and audio technology used for the visit. [name], CMA was able to get the patient set up on a video visit.  Patient location: home Patient and provider in visit Provider location: Office  I discussed the limitations of evaluation and management by telemedicine and the availability of in person appointments. The patient expressed understanding and agreed to proceed.  Visit Date: 09/29/2022  Today's healthcare provider: Danise EdgeStacey Donnavan Covault, MD     Subjective:    Patient ID: Sandra BeersKelly S Hargraves, female    DOB: [birth year], 47 y.o.   MRN: [id number]  Chief Complaint  Patient presents with   Follow-up    Follow up    HPI Patient is in today for follow up on chronic medical concerns. No recent febrile illness or hospitalizations. Denies CP/palp/SOB/HA/congestion/fevers/GI or GU c/o. Taking meds as prescribed. Her Vyvanse has been working adequately and will be refilled. No acute concerns. No complaints regarding polyuria or polydipsia.   Past Medical History:  Diagnosis Date   Anemia 06/07/2016   Cervical disc disease 11/25/2014   MRI 2014  1.  Moderate left central and foraminal stenosis due to a large left lateral recess disc protrusion at C6-7, potentially with a small amount of marginal blood products - correlate with chronicity of onset of patient's symptoms. 2.  There is also moderate right eccentric central stenosis at the C5-6 level due to a right paracentral disc protrusion. 3.  Chronic ethmoid sinusitis.  4.  Mild    Dog bite 10/20/2016   Dyslipidemia 06/07/2016   Eye disease 12/07/2014   Macular rosacea   Gestational diabetes 2009   Hyperglycemia 06/07/2016   Palpitations 11/25/2014   Paresthesia 11/25/2014   Preventative health care 12/07/2014    Past Surgical History:  Procedure Laterality Date   CESAREAN SECTION  2007   CESAREAN SECTION N/A 09/08/2015   Procedure: Repeat CESAREAN SECTION;  Surgeon: Olivia Mackieichard Taavon, MD;  Location: [facility];  Service: Obstetrics;  Laterality: N/A;  EDD: 09/14/15   WISDOM TOOTH EXTRACTION      Family History  Problem Relation Age of Onset   Heart disease Mother        2 stents 2015   Tremor Mother        benign essential   Colon polyps Mother [id number]   Heart disease Father        stents   Diabetes Father        [id number]   Stroke Father    Gout Father    Blindness Father    Gout Brother    Cancer Maternal Grandfather        Multiple Myeloma   Stroke Paternal Grandmother    Cancer Paternal Grandfather        bladder cancer   COPD Son    Esophageal cancer Neg Hx    Colon cancer Neg Hx    Rectal cancer Neg Hx    Stomach cancer Neg Hx     Social History   Socioeconomic History   Marital status: Married  Spouse name: Not on file   Number of children: Not on file   Years of education: Not on file   Highest education level: Not on file  Occupational History   Occupation: RN [employer]  Tobacco Use   Smoking status: Never   Smokeless tobacco: Never  Vaping Use   Vaping Use: Never used  Substance and Sexual Activity   Alcohol use: Yes    Alcohol/week: 4.0 standard drinks of alcohol    Types: 4 Glasses of wine per week   Drug use: No   Sexual activity: Yes    Comment: lives with husband, no red meat, works as Charity fundraiser at [employer]  Other Topics Concern   Not on file  Social History Narrative   Not on file   Social Determinants of Health   Financial Resource Strain: Not on file  Food Insecurity: Not on  file  Transportation Needs: Not on file  Physical Activity: Not on file  Stress: Not on file  Social Connections: Not on file  Intimate Partner Violence: Not on file    Outpatient Medications Prior to Visit  Medication Sig Dispense Refill   amitriptyline (ELAVIL) 10 MG tablet TAKE 0.5-2 TABLETS (5-20 MG TOTAL) BY MOUTH AT BEDTIME. 180 tablet 1   lisdexamfetamine (VYVANSE) 30 MG capsule Take 1 capsule (30 mg total) by mouth daily. February 2024 rx 30 capsule 0   lisdexamfetamine (VYVANSE) 30 MG capsule Take 1 capsule (30 mg total) by mouth daily. March 2024 rx 30 capsule 0   lisdexamfetamine (VYVANSE) 30 MG capsule Take 1 capsule (30 mg total) by mouth daily. 30 capsule 0   valACYclovir (VALTREX) 1000 MG tablet Take 1 tablet (1,000 mg total) by mouth daily as needed. 30 tablet 2   No facility-administered medications prior to visit.    Allergies  Allergen Reactions   No Known Allergies     Review of Systems  Constitutional:  Negative for fever and malaise/fatigue.  HENT:  Negative for congestion.   Eyes:  Negative for blurred vision.  Respiratory:  Negative for shortness of breath.   Cardiovascular:  Negative for chest pain, palpitations and leg swelling.  Gastrointestinal:  Negative for abdominal pain, blood in stool and nausea.  Genitourinary:  Negative for dysuria and frequency.  Musculoskeletal:  Negative for falls.  Skin:  Negative for rash.  Neurological:  Negative for dizziness, loss of consciousness and headaches.  Endo/Heme/Allergies:  Negative for environmental allergies.  Psychiatric/Behavioral:  Negative for depression. The patient is nervous/anxious.        Objective:    Physical Exam Constitutional:      General: She is not in acute distress.    Appearance: Normal appearance. She is not ill-appearing or toxic-appearing.  HENT:     Head: Normocephalic and atraumatic.     Right Ear: External ear normal.     Left Ear: External ear normal.     Nose: Nose  normal.  Eyes:     General:        Right eye: No discharge.        Left eye: No discharge.  Pulmonary:     Effort: Pulmonary effort is normal.  Skin:    Findings: No rash.  Neurological:     Mental Status: She is alert and oriented to person, place, and time.  Psychiatric:        Behavior: Behavior normal.     There were no vitals taken for this visit. Wt Readings from  Last 3 Encounters:  07/11/22 118 lb (53.5 kg)  07/01/22 121 lb (54.9 kg)  05/27/22 121 lb (54.9 kg)       Assessment & Plan:  Hyperglycemia Assessment & Plan: hgba1c acceptable, minimize simple carbs. Increase exercise as tolerated.   Orders: -     Hemoglobin A1c; Future -     CBC with Differential/Platelet; Future -     Comprehensive metabolic panel; Future -     TSH; Future  Insomnia, unspecified type Assessment & Plan: Encouraged good sleep hygiene such as dark, quiet room. No blue/green glowing lights such as computer screens in bedroom. No alcohol or stimulants in evening. Cut down on caffeine as able. Regular exercise is helpful but not just prior to bed time.  Using Elavil prn  Orders: -     CBC with Differential/Platelet; Future -     TSH; Future  Attention deficit disorder, unspecified hyperactivity presence Assessment & Plan: Vyvanse 30 mg refills given  Orders: -     CBC with Differential/Platelet; Future -     TSH; Future  Dyslipidemia Assessment & Plan: Encourage heart healthy diet such as MIND or DASH diet, increase exercise, avoid trans fats, simple carbohydrates and processed foods, consider a krill or fish or flaxseed oil cap daily.    Orders: -     CBC with Differential/Platelet; Future -     Lipid panel; Future -     TSH; Future  Anxiety Assessment & Plan: She is struggling with stress as her 48 year old son, a Holiday representative and he is not managing the school grades. She is trying to manage by talking with her son more   Recurrent cold sores Assessment & Plan: Given refills  on Valcyclovir   Other orders -     Lisdexamfetamine Dimesylate; Take 1 capsule (30 mg total) by mouth daily.  Dispense: 30 capsule; Refill: 0 -     Lisdexamfetamine Dimesylate; Take 1 capsule (30 mg total) by mouth daily.  Dispense: 30 capsule; Refill: 0 -     Lisdexamfetamine Dimesylate; Take 1 capsule (30 mg total) by mouth daily.  Dispense: 30 capsule; Refill: 0 -     valACYclovir HCl; Take 1 tablet (1,000 mg total) by mouth daily as needed.  Dispense: 30 tablet; Refill: 2     I discussed the assessment and treatment plan with the patient. The patient was provided an opportunity to ask questions and all were answered. The patient agreed with the plan and demonstrated an understanding of the instructions.   The patient was advised to call back or seek an in-person evaluation if the symptoms worsen or if the condition fails to improve as anticipated.  Danise Edge, MD [facility] Primary Care at [facility] [phone] (phone) [phone] (fax)  [facility]

## 2022-10-06 ENCOUNTER — Other Ambulatory Visit (HOSPITAL_BASED_OUTPATIENT_CLINIC_OR_DEPARTMENT_OTHER): Payer: Self-pay

## 2022-10-17 ENCOUNTER — Other Ambulatory Visit: Payer: Self-pay

## 2022-10-17 ENCOUNTER — Telehealth: Payer: Self-pay | Admitting: Family Medicine

## 2022-10-17 MED ORDER — FREESTYLE LIBRE 3 SENSOR MISC: 5 refills | Status: DC

## 2022-10-17 MED ORDER — FREESTYLE LIBRE 3 READER DEVI: 1.0000 | 0 refills | Status: AC

## 2022-10-17 NOTE — Telephone Encounter (Signed)
Glucometers reader sent

## 2022-10-17 NOTE — Telephone Encounter (Signed)
Pt called stating that she would like to get a Rx for a glucometer. Pt called her insurance and they gave her two glucometers that they would cover:  Freestyle Libre 3 - [place] is required Dexcom (pt stated they did not give her a specific model)  Pt stated she would like to try to get the Freestyle first if possible.

## 2022-11-04 ENCOUNTER — Other Ambulatory Visit (HOSPITAL_BASED_OUTPATIENT_CLINIC_OR_DEPARTMENT_OTHER): Payer: Self-pay

## 2022-12-01 ENCOUNTER — Other Ambulatory Visit: Payer: Self-pay | Admitting: Family Medicine

## 2023-01-11 ENCOUNTER — Other Ambulatory Visit: Payer: Self-pay

## 2023-01-11 ENCOUNTER — Other Ambulatory Visit (INDEPENDENT_AMBULATORY_CARE_PROVIDER_SITE_OTHER): Payer: No Typology Code available for payment source

## 2023-01-11 ENCOUNTER — Telehealth: Payer: Self-pay | Admitting: *Deleted

## 2023-01-11 DIAGNOSIS — E785 Hyperlipidemia, unspecified: Secondary | ICD-10-CM | POA: Diagnosis not present

## 2023-01-11 DIAGNOSIS — Z79899 Other long term (current) drug therapy: Secondary | ICD-10-CM

## 2023-01-11 DIAGNOSIS — F988 Other specified behavioral and emotional disorders with onset usually occurring in childhood and adolescence: Secondary | ICD-10-CM

## 2023-01-11 DIAGNOSIS — G4700 Insomnia, unspecified: Secondary | ICD-10-CM

## 2023-01-11 DIAGNOSIS — R739 Hyperglycemia, unspecified: Secondary | ICD-10-CM

## 2023-01-11 LAB — LIPID PANEL
Cholesterol: 149 mg/dL (ref 0–200)
HDL: 49.8 mg/dL (ref >39.00)
LDL Cholesterol: 91 mg/dL (ref 0–99)
NonHDL: 99.11
Total CHOL/HDL Ratio: 3
Triglycerides: 42 mg/dL (ref 0.0–149.0)
VLDL: 8.4 mg/dL (ref 0.0–40.0)

## 2023-01-11 LAB — COMPREHENSIVE METABOLIC PANEL
ALT: 21 U/L (ref 0–35)
AST: 25 U/L (ref 0–37)
Albumin: 4.2 g/dL (ref 3.5–5.2)
Alkaline Phosphatase: 44 U/L (ref 39–117)
BUN: 15 mg/dL (ref 6–23)
CO2: 26 mEq/L (ref 19–32)
Calcium: 9 mg/dL (ref 8.4–10.5)
Chloride: 107 mEq/L (ref 96–112)
Creatinine, Ser: 0.59 mg/dL (ref 0.40–1.20)
GFR: 107.98 mL/min (ref >60.00)
Glucose, Bld: 102 mg/dL — ABNORMAL HIGH (ref 70–99)
Potassium: 4.4 mEq/L (ref 3.5–5.1)
Sodium: 141 mEq/L (ref 135–145)
Total Bilirubin: 0.3 mg/dL (ref 0.2–1.2)
Total Protein: 6.7 g/dL (ref 6.0–8.3)

## 2023-01-11 LAB — CBC WITH DIFFERENTIAL/PLATELET
Basophils Absolute: 0 10*3/uL (ref 0.0–0.1)
Basophils Relative: 0.8 % (ref 0.0–3.0)
Eosinophils Absolute: 0.9 10*3/uL — ABNORMAL HIGH (ref 0.0–0.7)
Eosinophils Relative: 15.4 % — ABNORMAL HIGH (ref 0.0–5.0)
HCT: 36.1 % (ref 36.0–46.0)
Hemoglobin: 11.9 g/dL — ABNORMAL LOW (ref 12.0–15.0)
Lymphocytes Relative: 29.7 % (ref 12.0–46.0)
Lymphs Abs: 1.7 10*3/uL (ref 0.7–4.0)
MCHC: 32.9 g/dL (ref 30.0–36.0)
MCV: 87.6 fl (ref 78.0–100.0)
Monocytes Absolute: 0.4 10*3/uL (ref 0.1–1.0)
Monocytes Relative: 7.8 % (ref 3.0–12.0)
Neutro Abs: 2.7 10*3/uL (ref 1.4–7.7)
Neutrophils Relative %: 46.3 % (ref 43.0–77.0)
Platelets: 291 10*3/uL (ref 150.0–400.0)
RBC: 4.12 Mil/uL (ref 3.87–5.11)
RDW: 14.6 % (ref 11.5–15.5)
WBC: 5.7 10*3/uL (ref 4.0–10.5)

## 2023-01-11 LAB — HEMOGLOBIN A1C: Hgb A1c MFr Bld: 6 % (ref 4.6–6.5)

## 2023-01-11 LAB — TSH: TSH: 2.69 u[IU]/mL (ref 0.35–5.50)

## 2023-01-11 NOTE — Telephone Encounter (Signed)
Pt came in for labs this morning.  She is questioning if UDS is due at this time. Advised pt I did not have an order for one and last UDS collected was 07/11/22.  Pt left urine sample in case it is due at this time.  Please advise and place future order if appropriate.

## 2023-01-11 NOTE — Assessment & Plan Note (Signed)
Encouraged good sleep hygiene such as dark, quiet room. No blue/green glowing lights such as computer screens in bedroom. No alcohol or stimulants in evening. Cut down on caffeine as able. Regular exercise is helpful but not just prior to bed time.  Amitriptyline prn

## 2023-01-11 NOTE — Assessment & Plan Note (Signed)
Encourage heart healthy diet such as MIND or DASH diet, increase exercise, avoid trans fats, simple carbohydrates and processed foods, consider a krill or fish or flaxseed oil cap daily.  °

## 2023-01-11 NOTE — Assessment & Plan Note (Signed)
 hgba1c acceptable, minimize simple carbs. Increase exercise as tolerated.  

## 2023-01-11 NOTE — Assessment & Plan Note (Signed)
Stale on current meds, no changes

## 2023-01-11 NOTE — Telephone Encounter (Signed)
Yes she up to date on Uds and contract

## 2023-01-11 NOTE — Telephone Encounter (Signed)
Thanks

## 2023-01-12 ENCOUNTER — Other Ambulatory Visit (HOSPITAL_BASED_OUTPATIENT_CLINIC_OR_DEPARTMENT_OTHER): Payer: Self-pay

## 2023-01-12 ENCOUNTER — Telehealth (INDEPENDENT_AMBULATORY_CARE_PROVIDER_SITE_OTHER): Payer: No Typology Code available for payment source | Admitting: Family Medicine

## 2023-01-12 DIAGNOSIS — G4700 Insomnia, unspecified: Secondary | ICD-10-CM | POA: Diagnosis not present

## 2023-01-12 DIAGNOSIS — E785 Hyperlipidemia, unspecified: Secondary | ICD-10-CM | POA: Diagnosis not present

## 2023-01-12 DIAGNOSIS — R739 Hyperglycemia, unspecified: Secondary | ICD-10-CM | POA: Diagnosis not present

## 2023-01-12 DIAGNOSIS — F988 Other specified behavioral and emotional disorders with onset usually occurring in childhood and adolescence: Secondary | ICD-10-CM

## 2023-01-12 MED ORDER — LISDEXAMFETAMINE DIMESYLATE 30 MG PO CAPS
30.0000 mg | ORAL_CAPSULE | Freq: Every day | ORAL | 0 refills | Status: DC
  Filled 2023-01-12: qty 30, 30d supply, fill #0

## 2023-01-12 MED ORDER — LISDEXAMFETAMINE DIMESYLATE 30 MG PO CAPS: 30.0000 mg | ORAL_CAPSULE | Freq: Every day | ORAL | 0 refills | Status: DC

## 2023-01-12 MED ORDER — SERTRALINE HCL 25 MG PO TABS: 25.0000 mg | ORAL_TABLET | Freq: Every day | ORAL | 3 refills | Status: DC

## 2023-01-12 MED ORDER — LISDEXAMFETAMINE DIMESYLATE 30 MG PO CAPS
30.0000 mg | ORAL_CAPSULE | Freq: Every day | ORAL | 0 refills | Status: DC
  Filled 2023-01-12 – 2023-04-17 (×3): qty 30, 30d supply, fill #0

## 2023-01-13 ENCOUNTER — Encounter: Payer: Self-pay | Admitting: Family Medicine

## 2023-01-13 LAB — DRUG MONITORING PANEL 376104, URINE: Desmethyltramadol: NEGATIVE ng/mL (ref <100)

## 2023-01-13 LAB — DM TEMPLATE

## 2023-01-13 NOTE — Progress Notes (Signed)
MyChart Video Visit    Virtual Visit via Video Note   This patient is at least at moderate risk for complications without adequate follow up. This format is felt to be most appropriate for this patient at this time. Physical exam was limited by quality of the video and audio technology used for the visit. Sandra Long, CMA was able to get the patient set up on a video visit.  Patient location: Work Patient and provider in visit Provider location: Office  I discussed the limitations of evaluation and management by telemedicine and the availability of in person appointments. The patient expressed understanding and agreed to proceed.  Visit Date: 01/12/2023  Today's healthcare provider: Danise Edge, MD     Subjective:    Patient ID: Sandra Long, female    DOB: 1976/01/15, 47 y.o.   MRN: [id number]  Chief Complaint  Patient presents with  . ADHD    Follow up, " doing ok with medication"    HPI Discussed the use of AI scribe software for clinical note transcription with the patient, who gave verbal consent to proceed. Patient is a 47 yo female in today for follow up on chronic medical concerns. No recent febrile illness or hospitalizations. Denies CP/palp/SOB/HA/congestion/fevers/GI or GU c/o. Taking meds as prescribed  History of Present Illness   The patient, with a history of hyperglycemia., has been attempting to manage their condition through weight training and walking. Despite these efforts, the patient reports no change in their A1c levels. The patient also reports experiencing anxiety, which impacts their daily life and sleep. The patient has previously tried Lexapro and Prozac to manage their anxiety but discontinued due to side effects. The patient's anxiety has been heightened recently due to their son's struggles with anxiety and their dog's health issues. she acknowledges not eating for extended hours a day. often does not eat all evening or breakfast in the morning.         Past Medical History:  Diagnosis Date  . Anemia 06/07/2016  . Cervical disc disease 11/25/2014   MRI 2014  1.  Moderate left central and foraminal stenosis due to a large left lateral recess disc protrusion at C6-7, potentially with a small amount of marginal blood products - correlate with chronicity of onset of patient's symptoms. 2.  There is also moderate right eccentric central stenosis at the C5-6 level due to a right paracentral disc protrusion. 3.  Chronic ethmoid sinusitis. 4.  Mild   . Dog bite 10/20/2016  . Dyslipidemia 06/07/2016  . Eye disease 12/07/2014   Macular rosacea  . Gestational diabetes 2009  . Hyperglycemia 06/07/2016  . Palpitations 11/25/2014  . Paresthesia 11/25/2014  . Preventative health care 12/07/2014    Past Surgical History:  Procedure Laterality Date  . CESAREAN SECTION  2007  . CESAREAN SECTION N/A 09/08/2015   Procedure: Repeat CESAREAN SECTION;  Surgeon: Olivia Mackie, MD;  Location: [facility];  Service: Obstetrics;  Laterality: N/A;  EDD: 09/14/15  . WISDOM TOOTH EXTRACTION      Family History  Problem Relation Age of Onset  . Heart disease Mother        2 stents 2015  . Tremor Mother        benign essential  . Colon polyps Mother 73  . Heart disease Father        stents  . Diabetes Father        93  . Stroke Father   . Gout Father   .  Blindness Father   . Gout Brother   . Cancer Maternal Grandfather        Multiple Myeloma  . Stroke Paternal Grandmother   . Cancer Paternal Grandfather        bladder cancer  . COPD Son   . Esophageal cancer Neg Hx   . Colon cancer Neg Hx   . Rectal cancer Neg Hx   . Stomach cancer Neg Hx     Social History   Socioeconomic History  . Marital status: Married    Spouse name: Not on file  . Number of children: Not on file  . Years of education: Not on file  . Highest education level: Not on file  Occupational History  . Occupation: RN [employer]   Tobacco Use  . Smoking status: Never  . Smokeless tobacco: Never  Vaping Use  . Vaping status: Never Used  Substance and Sexual Activity  . Alcohol use: Yes    Alcohol/week: 4.0 standard drinks of alcohol    Types: 4 Glasses of wine per week  . Drug use: No  . Sexual activity: Yes    Comment: lives with husband, no red meat, works as Charity fundraiser at [employer]  Other Topics Concern  . Not on file  Social History Narrative  . Not on file   Social Determinants of Health   Financial Resource Strain: Not on file  Food Insecurity: Not on file  Transportation Needs: Not on file  Physical Activity: Not on file  Stress: Not on file  Social Connections: Not on file  Intimate Partner Violence: Not on file    Outpatient Medications Prior to Visit  Medication Sig Dispense Refill  . amitriptyline (ELAVIL) 10 MG tablet TAKE 0.5-2 TABLETS (5-20 MG TOTAL) BY MOUTH AT BEDTIME. 180 tablet 1  . Continuous Glucose Receiver (FREESTYLE LIBRE 3 READER) DEVI 1 each by Does not apply route as directed. 1 each 0  . Continuous Glucose Sensor (FREESTYLE LIBRE 3 SENSOR) MISC Use as directed 2 each 5  . valACYclovir (VALTREX) 1000 MG tablet TAKE 1 TABLET (1,000 MG TOTAL) BY MOUTH DAILY AS NEEDED. 90 tablet 1  . lisdexamfetamine (VYVANSE) 30 MG capsule Take 1 capsule (30 mg total) by mouth daily. 30 capsule 0  . lisdexamfetamine (VYVANSE) 30 MG capsule Take 1 capsule (30 mg total) by mouth daily. 30 capsule 0  . lisdexamfetamine (VYVANSE) 30 MG capsule Take 1 capsule (30 mg total) by mouth daily. 30 capsule 0   No facility-administered medications prior to visit.    Allergies  Allergen Reactions  . No Known Allergies     Review of Systems  Constitutional:  Positive for malaise/fatigue. Negative for fever.  HENT:  Negative for congestion.   Eyes:  Negative for blurred vision.  Respiratory:  Negative for shortness of breath.   Cardiovascular:  Negative for chest pain, palpitations and leg swelling.   Gastrointestinal:  Negative for abdominal pain, blood in stool and nausea.  Genitourinary:  Negative for dysuria and frequency.  Musculoskeletal:  Negative for falls.  Skin:  Negative for rash.  Neurological:  Negative for dizziness, loss of consciousness and headaches.  Endo/Heme/Allergies:  Negative for environmental allergies.  Psychiatric/Behavioral:  Positive for depression. Negative for substance abuse and suicidal ideas. The patient is nervous/anxious and has insomnia.        Objective:    Physical Exam Constitutional:      General: She is not in acute distress.    Appearance: Normal  appearance. She is well-developed. She is not toxic-appearing.  HENT:     Head: Normocephalic and atraumatic.     Right Ear: External ear normal.     Left Ear: External ear normal.     Nose: Nose normal.  Eyes:     General:        Right eye: No discharge.        Left eye: No discharge.     Conjunctiva/sclera: Conjunctivae normal.  Neck:     Thyroid: No thyromegaly.  Cardiovascular:     Rate and Rhythm: Normal rate and regular rhythm.     Heart sounds: Normal heart sounds. No murmur heard. Pulmonary:     Effort: Pulmonary effort is normal. No respiratory distress.     Breath sounds: Normal breath sounds.  Abdominal:     General: Bowel sounds are normal.     Palpations: Abdomen is soft.     Tenderness: There is no abdominal tenderness. There is no guarding.  Musculoskeletal:        General: Normal range of motion.     Cervical back: Neck supple.  Lymphadenopathy:     Cervical: No cervical adenopathy.  Skin:    General: Skin is warm and dry.  Neurological:     Mental Status: She is alert and oriented to person, place, and time.  Psychiatric:        Mood and Affect: Mood normal.        Behavior: Behavior normal.        Thought Content: Thought content normal.        Judgment: Judgment normal.    There were no vitals taken for this visit. Wt Readings from Last 3 Encounters:   07/11/22 118 lb (53.5 kg)  07/01/22 121 lb (54.9 kg)  05/27/22 121 lb (54.9 kg)       Assessment & Plan:  Dyslipidemia Assessment & Plan: Encourage heart healthy diet such as MIND or DASH diet, increase exercise, avoid trans fats, simple carbohydrates and processed foods, consider a krill or fish or flaxseed oil cap daily.     Hyperglycemia Assessment & Plan: hgba1c acceptable, minimize simple carbs. Increase exercise as tolerated.    Attention deficit disorder, unspecified hyperactivity presence Assessment & Plan: Stale on current meds, no changes   Insomnia, unspecified type Assessment & Plan: Encouraged good sleep hygiene such as dark, quiet room. No blue/green glowing lights such as computer screens in bedroom. No alcohol or stimulants in evening. Cut down on caffeine as able. Regular exercise is helpful but not just prior to bed time.  Amitriptyline prn   Other orders -     Sertraline HCl; Take 1 tablet (25 mg total) by mouth daily.  Dispense: 30 tablet; Refill: 3 -     Lisdexamfetamine Dimesylate; Take 1 capsule (30 mg total) by mouth daily.  Dispense: 30 capsule; Refill: 0 -     Lisdexamfetamine Dimesylate; Take 1 capsule (30 mg total) by mouth daily. September 2024  Dispense: 30 capsule; Refill: 0 -     Lisdexamfetamine Dimesylate; Take 1 capsule (30 mg total) by mouth daily. October 2024  Dispense: 30 capsule; Refill: 0     Assessment and Plan    Prediabetes: Stable HbA1c at 6.0. No evidence of insulin resistance. Discussed potential impact of meal timing and content on blood glucose levels. -Continue current exercise regimen. -Add small protein snack in the morning and evening. -Check HbA1c in 6 months.  Hyperlipidemia: Increase in total cholesterol, but good cholesterol also  increased appropriately. Discussed potential impact of midlife hormonal changes on cholesterol levels. -Continue current diet and exercise regimen. -Check lipid panel in 6  months.  Anxiety: History of unsuccessful trials of Lexapro and Prozac due to side effects. Current stressors include work and family health issues. -Start Sertraline 25mg  daily, may increase to 50mg  daily after 1 month if well-tolerated. -Schedule virtual follow-up in 3 months to assess response to Sertraline.  ADHD: Currently on Vyvanse, but experiencing appetite suppression. -Continue Vyvanse as prescribed. -Consider evaluation for comprehensive ADHD testing.  General Health Maintenance -Schedule physical exam with Pap smear in 6 months.         I discussed the assessment and treatment plan with the patient. The patient was provided an opportunity to ask questions and all were answered. The patient agreed with the plan and demonstrated an understanding of the instructions.   The patient was advised to call back or seek an in-person evaluation if the symptoms worsen or if the condition fails to improve as anticipated.  Danise Edge, MD [facility] Primary Care at [facility] [phone] (phone) [phone] (fax)  [facility]

## 2023-01-14 LAB — DRUG MONITORING PANEL 376104, URINE
Amphetamines: POSITIVE ng/mL — AB (ref <500)
Barbiturates: NEGATIVE ng/mL (ref <300)
Benzodiazepines: NEGATIVE ng/mL (ref <100)
Cocaine Metabolite: NEGATIVE ng/mL (ref <150)
Desmethyltramadol: NEGATIVE ng/mL (ref <100)
Methamphetamine: NEGATIVE ng/mL (ref <250)
Opiates: NEGATIVE ng/mL (ref <100)
Oxycodone: NEGATIVE ng/mL (ref <100)
Tramadol: NEGATIVE ng/mL (ref <100)

## 2023-01-22 ENCOUNTER — Encounter: Payer: Self-pay | Admitting: Family Medicine

## 2023-01-22 NOTE — Assessment & Plan Note (Deleted)
Was recently started on Sertraline 25 mg daily secondary to ongoing and worsening stressors.

## 2023-01-22 NOTE — Progress Notes (Deleted)
Subjective:    Patient ID: Sandra Long, female    DOB: 25-Jan-1976, 47 y.o.   MRN: [id number]  No chief complaint on file.   HPI Discussed the use of AI scribe software for clinical note transcription with the patient, who gave verbal consent to proceed.  History of Present Illness        Patient is a 47 yo female in today for follow up on chronic medical concerns. No recent febrile illness or hospitalizations. Denies CP/palp/SOB/HA/congestion/fevers/GI or GU c/o. Taking meds as prescribed     Past Medical History:  Diagnosis Date  . Anemia 06/07/2016  . Cervical disc disease 11/25/2014   MRI 2014  1.  Moderate left central and foraminal stenosis due to a large left lateral recess disc protrusion at C6-7, potentially with a small amount of marginal blood products - correlate with chronicity of onset of patient's symptoms. 2.  There is also moderate right eccentric central stenosis at the C5-6 level due to a right paracentral disc protrusion. 3.  Chronic ethmoid sinusitis. 4.  Mild   . Dog bite 10/20/2016  . Dyslipidemia 06/07/2016  . Eye disease 12/07/2014   Macular rosacea  . Gestational diabetes 2009  . Hyperglycemia 06/07/2016  . Palpitations 11/25/2014  . Paresthesia 11/25/2014  . Preventative health care 12/07/2014    Past Surgical History:  Procedure Laterality Date  . CESAREAN SECTION  2007  . CESAREAN SECTION N/A 09/08/2015   Procedure: Repeat CESAREAN SECTION;  Surgeon: Olivia Mackie, MD;  Location: [facility];  Service: Obstetrics;  Laterality: N/A;  EDD: 09/14/15  . WISDOM TOOTH EXTRACTION      Family History  Problem Relation Age of Onset  . Heart disease Mother        2 stents 2015  . Tremor Mother        benign essential  . Colon polyps Mother 65  . Heart disease Father        stents  . Diabetes Father        40  . Stroke Father   . Gout Father   . Blindness Father   . Gout Brother   . Cancer Maternal Grandfather        Multiple Myeloma  .  Stroke Paternal Grandmother   . Cancer Paternal Grandfather        bladder cancer  . COPD Son   . Esophageal cancer Neg Hx   . Colon cancer Neg Hx   . Rectal cancer Neg Hx   . Stomach cancer Neg Hx     Social History   Socioeconomic History  . Marital status: Married    Spouse name: Not on file  . Number of children: Not on file  . Years of education: Not on file  . Highest education level: Not on file  Occupational History  . Occupation: RN [employer]  Tobacco Use  . Smoking status: Never  . Smokeless tobacco: Never  Vaping Use  . Vaping status: Never Used  Substance and Sexual Activity  . Alcohol use: Yes    Alcohol/week: 4.0 standard drinks of alcohol    Types: 4 Glasses of wine per week  . Drug use: No  . Sexual activity: Yes    Comment: lives with husband, no red meat, works as Charity fundraiser at [employer]  Other Topics Concern  . Not on file  Social History Narrative  . Not on file   Social Determinants of Health   Financial Resource  Strain: Not on file  Food Insecurity: Not on file  Transportation Needs: Not on file  Physical Activity: Not on file  Stress: Not on file  Social Connections: Not on file  Intimate Partner Violence: Not on file    Outpatient Medications Prior to Visit  Medication Sig Dispense Refill  . amitriptyline (ELAVIL) 10 MG tablet TAKE 0.5-2 TABLETS (5-20 MG TOTAL) BY MOUTH AT BEDTIME. 180 tablet 1  . Continuous Glucose Receiver (FREESTYLE LIBRE 3 READER) DEVI 1 each by Does not apply route as directed. 1 each 0  . Continuous Glucose Sensor (FREESTYLE LIBRE 3 SENSOR) MISC Use as directed 2 each 5  . lisdexamfetamine (VYVANSE) 30 MG capsule Take 1 capsule (30 mg total) by mouth daily. 30 capsule 0  . lisdexamfetamine (VYVANSE) 30 MG capsule Take 1 capsule (30 mg total) by mouth daily. September 2024 30 capsule 0  . lisdexamfetamine (VYVANSE) 30 MG capsule Take 1 capsule (30 mg total) by mouth daily. October 2024 30  capsule 0  . sertraline (ZOLOFT) 25 MG tablet Take 1 tablet (25 mg total) by mouth daily. 30 tablet 3  . valACYclovir (VALTREX) 1000 MG tablet TAKE 1 TABLET (1,000 MG TOTAL) BY MOUTH DAILY AS NEEDED. 90 tablet 1   No facility-administered medications prior to visit.    Allergies  Allergen Reactions  . No Known Allergies     Review of Systems  Constitutional:  Negative for fever and malaise/fatigue.  HENT:  Negative for congestion.   Eyes:  Negative for blurred vision.  Respiratory:  Negative for shortness of breath.   Cardiovascular:  Negative for chest pain, palpitations and leg swelling.  Gastrointestinal:  Negative for abdominal pain, blood in stool and nausea.  Genitourinary:  Negative for dysuria and frequency.  Musculoskeletal:  Negative for falls.  Skin:  Negative for rash.  Neurological:  Negative for dizziness, loss of consciousness and headaches.  Endo/Heme/Allergies:  Negative for environmental allergies.  Psychiatric/Behavioral:  Negative for depression. The patient is nervous/anxious.       Objective:    Physical Exam Constitutional:      General: She is not in acute distress.    Appearance: Normal appearance. She is well-developed. She is not toxic-appearing.  HENT:     Head: Normocephalic and atraumatic.     Right Ear: External ear normal.     Left Ear: External ear normal.     Nose: Nose normal.  Eyes:     General:        Right eye: No discharge.        Left eye: No discharge.     Conjunctiva/sclera: Conjunctivae normal.  Neck:     Thyroid: No thyromegaly.  Cardiovascular:     Rate and Rhythm: Normal rate and regular rhythm.     Heart sounds: Normal heart sounds. No murmur heard. Pulmonary:     Effort: Pulmonary effort is normal. No respiratory distress.     Breath sounds: Normal breath sounds.  Abdominal:     General: Bowel sounds are normal.     Palpations: Abdomen is soft.     Tenderness: There is no abdominal tenderness. There is no guarding.   Musculoskeletal:        General: Normal range of motion.     Cervical back: Neck supple.  Lymphadenopathy:     Cervical: No cervical adenopathy.  Skin:    General: Skin is warm and dry.  Neurological:     Mental Status: She is alert and oriented to person,  place, and time.  Psychiatric:        Mood and Affect: Mood normal.        Behavior: Behavior normal.        Thought Content: Thought content normal.        Judgment: Judgment normal.   There were no vitals taken for this visit. Wt Readings from Last 3 Encounters:  07/11/22 118 lb (53.5 kg)  07/01/22 121 lb (54.9 kg)  05/27/22 121 lb (54.9 kg)    Diabetic Foot Exam - Simple   No data filed    Lab Results  Component Value Date   WBC 5.7 01/11/2023   HGB 11.9 (L) 01/11/2023   HCT 36.1 01/11/2023   PLT 291.0 01/11/2023   GLUCOSE 102 (H) 01/11/2023   CHOL 149 01/11/2023   TRIG 42.0 01/11/2023   HDL 49.80 01/11/2023   LDLCALC 91 01/11/2023   ALT 21 01/11/2023   AST 25 01/11/2023   NA 141 01/11/2023   K 4.4 01/11/2023   CL 107 01/11/2023   CREATININE 0.59 01/11/2023   BUN 15 01/11/2023   CO2 26 01/11/2023   TSH 2.69 01/11/2023   HGBA1C 6.0 01/11/2023    Lab Results  Component Value Date   TSH 2.69 01/11/2023   Lab Results  Component Value Date   WBC 5.7 01/11/2023   HGB 11.9 (L) 01/11/2023   HCT 36.1 01/11/2023   MCV 87.6 01/11/2023   PLT 291.0 01/11/2023   Lab Results  Component Value Date   NA 141 01/11/2023   K 4.4 01/11/2023   CO2 26 01/11/2023   GLUCOSE 102 (H) 01/11/2023   BUN 15 01/11/2023   CREATININE 0.59 01/11/2023   BILITOT 0.3 01/11/2023   ALKPHOS 44 01/11/2023   AST 25 01/11/2023   ALT 21 01/11/2023   PROT 6.7 01/11/2023   ALBUMIN 4.2 01/11/2023   CALCIUM 9.0 01/11/2023   GFR 107.98 01/11/2023   Lab Results  Component Value Date   CHOL 149 01/11/2023   Lab Results  Component Value Date   HDL 49.80 01/11/2023   Lab Results  Component Value Date   LDLCALC 91 01/11/2023    Lab Results  Component Value Date   TRIG 42.0 01/11/2023   Lab Results  Component Value Date   CHOLHDL 3 01/11/2023   Lab Results  Component Value Date   HGBA1C 6.0 01/11/2023       Assessment & Plan:  Anxiety Assessment & Plan: Was recently started on Sertraline 25 mg daily secondary to ongoing and worsening stressors.    Attention deficit disorder, unspecified hyperactivity presence Assessment & Plan: Stable on current meds, no changes   Hyperglycemia Assessment & Plan: hgba1c acceptable, minimize simple carbs. Increase exercise as tolerated.    Dyslipidemia Assessment & Plan: Encourage heart healthy diet such as MIND or DASH diet, increase exercise, avoid trans fats, simple carbohydrates and processed foods, consider a krill or fish or flaxseed oil cap daily.       Assessment and Plan              Danise Edge, MD

## 2023-01-22 NOTE — Assessment & Plan Note (Deleted)
Encourage heart healthy diet such as MIND or DASH diet, increase exercise, avoid trans fats, simple carbohydrates and processed foods, consider a krill or fish or flaxseed oil cap daily.  °

## 2023-01-22 NOTE — Assessment & Plan Note (Deleted)
hgba1c acceptable, minimize simple carbs. Increase exercise as tolerated.  

## 2023-01-22 NOTE — Assessment & Plan Note (Deleted)
Stable on current meds, no changes. 

## 2023-01-23 ENCOUNTER — Ambulatory Visit: Payer: No Typology Code available for payment source | Admitting: Family Medicine

## 2023-01-23 DIAGNOSIS — F988 Other specified behavioral and emotional disorders with onset usually occurring in childhood and adolescence: Secondary | ICD-10-CM

## 2023-01-23 DIAGNOSIS — F419 Anxiety disorder, unspecified: Secondary | ICD-10-CM

## 2023-01-23 DIAGNOSIS — R739 Hyperglycemia, unspecified: Secondary | ICD-10-CM

## 2023-01-23 DIAGNOSIS — E785 Hyperlipidemia, unspecified: Secondary | ICD-10-CM

## 2023-02-09 ENCOUNTER — Other Ambulatory Visit: Payer: Self-pay | Admitting: Family Medicine

## 2023-03-27 ENCOUNTER — Other Ambulatory Visit (HOSPITAL_BASED_OUTPATIENT_CLINIC_OR_DEPARTMENT_OTHER): Payer: Self-pay

## 2023-03-27 ENCOUNTER — Other Ambulatory Visit: Payer: Self-pay

## 2023-04-12 ENCOUNTER — Other Ambulatory Visit (HOSPITAL_BASED_OUTPATIENT_CLINIC_OR_DEPARTMENT_OTHER): Payer: Self-pay

## 2023-04-16 NOTE — Assessment & Plan Note (Signed)
Continue Sertraline

## 2023-04-16 NOTE — Assessment & Plan Note (Signed)
hgba1c acceptable, minimize simple carbs. Increase exercise as tolerated.  

## 2023-04-16 NOTE — Assessment & Plan Note (Signed)
Tolerating Vyvanse

## 2023-04-16 NOTE — Assessment & Plan Note (Signed)
Encouraged good sleep hygiene such as dark, quiet room. No blue/green glowing lights such as computer screens in bedroom. No alcohol or stimulants in evening. Cut down on caffeine as able. Regular exercise is helpful but not just prior to bed time.  Amitriptyline prn

## 2023-04-16 NOTE — Assessment & Plan Note (Signed)
Encourage heart healthy diet such as MIND or DASH diet, increase exercise, avoid trans fats, simple carbohydrates and processed foods, consider a krill or fish or flaxseed oil cap daily.  °

## 2023-04-17 ENCOUNTER — Telehealth (INDEPENDENT_AMBULATORY_CARE_PROVIDER_SITE_OTHER): Payer: 59 | Admitting: Family Medicine

## 2023-04-17 ENCOUNTER — Other Ambulatory Visit (HOSPITAL_BASED_OUTPATIENT_CLINIC_OR_DEPARTMENT_OTHER): Payer: Self-pay

## 2023-04-17 ENCOUNTER — Other Ambulatory Visit: Payer: Self-pay | Admitting: Family Medicine

## 2023-04-17 ENCOUNTER — Encounter: Payer: Self-pay | Admitting: Family Medicine

## 2023-04-17 DIAGNOSIS — F419 Anxiety disorder, unspecified: Secondary | ICD-10-CM

## 2023-04-17 DIAGNOSIS — R739 Hyperglycemia, unspecified: Secondary | ICD-10-CM

## 2023-04-17 DIAGNOSIS — G4700 Insomnia, unspecified: Secondary | ICD-10-CM | POA: Diagnosis not present

## 2023-04-17 DIAGNOSIS — E785 Hyperlipidemia, unspecified: Secondary | ICD-10-CM | POA: Diagnosis not present

## 2023-04-17 DIAGNOSIS — F909 Attention-deficit hyperactivity disorder, unspecified type: Secondary | ICD-10-CM | POA: Diagnosis not present

## 2023-04-17 MED ORDER — LISDEXAMFETAMINE DIMESYLATE 30 MG PO CAPS
30.0000 mg | ORAL_CAPSULE | Freq: Every day | ORAL | 0 refills | Status: DC
  Filled 2023-04-17: qty 30, 30d supply, fill #0

## 2023-04-17 MED ORDER — LISDEXAMFETAMINE DIMESYLATE 30 MG PO CAPS
30.0000 mg | ORAL_CAPSULE | Freq: Every day | ORAL | 0 refills | Status: DC
  Filled 2023-04-17 – 2023-05-31 (×2): qty 30, 30d supply, fill #0

## 2023-04-17 MED ORDER — LISDEXAMFETAMINE DIMESYLATE 30 MG PO CAPS
30.0000 mg | ORAL_CAPSULE | Freq: Every day | ORAL | 0 refills | Status: DC
  Filled 2023-04-17 – 2023-07-17 (×3): qty 30, 30d supply, fill #0

## 2023-04-17 NOTE — Progress Notes (Addendum)
Subjective:    Patient ID: Sandra Long, female    DOB: 06/16/1976, 47 y.o.   MRN: [id number] Virtual Visit via Video Note   This patient is at least at moderate risk for complications without adequate follow up. This format is felt to be most appropriate for this patient at this time. Physical exam was limited by quality of the video and audio technology used for the visit. Juanetta CMA was able to get the patient set up on a video visit.  Patient location: Home Patient and provider in visit Provider location: Office  I discussed the limitations of evaluation and management by telemedicine and the availability of in person appointments. The patient expressed understanding and agreed to proceed. No chief complaint on file.   HPI Discussed the use of AI scribe software for clinical note transcription with the patient, who gave verbal consent to proceed.  History of Present Illness   The patient, with a history of perimenopause, presents with concerns about muscle loss, particularly in the hips and joints. They have been working with a trainer to prevent further muscle loss. The trainer suggested that the muscle loss could be due to perimenopause and estrogen levels. The patient is considering hormone therapy but is hesitant due to potential side effects and risks, including breast cancer and blood clots. They deny experiencing hot flashes but admit to mood changes, which they attribute to potential hormonal changes.  The patient also discusses the ongoing process of adopting a child, which has been a significant source of stress. They express frustration with the financial and bureaucratic challenges of the adoption process.  Lastly, the patient inquires about potential therapists, indicating a desire for mental health support. They express a preference for a therapist outside of the medical center and are open to virtual sessions.        Past Medical History:  Diagnosis Date   Anemia  06/07/2016   Cervical disc disease 11/25/2014   MRI 2014  1.  Moderate left central and foraminal stenosis due to a large left lateral recess disc protrusion at C6-7, potentially with a small amount of marginal blood products - correlate with chronicity of onset of patient's symptoms. 2.  There is also moderate right eccentric central stenosis at the C5-6 level due to a right paracentral disc protrusion. 3.  Chronic ethmoid sinusitis. 4.  Mild    Dog bite 10/20/2016   Dyslipidemia 06/07/2016   Eye disease 12/07/2014   Macular rosacea   Gestational diabetes 2009   Hyperglycemia 06/07/2016   Palpitations 11/25/2014   Paresthesia 11/25/2014   Preventative health care 12/07/2014    Past Surgical History:  Procedure Laterality Date   CESAREAN SECTION  2007   CESAREAN SECTION N/A 09/08/2015   Procedure: Repeat CESAREAN SECTION;  Surgeon: Olivia Mackie, MD;  Location: [facility];  Service: Obstetrics;  Laterality: N/A;  EDD: 09/14/15   WISDOM TOOTH EXTRACTION      Family History  Problem Relation Age of Onset   Heart disease Mother        2 stents 2015   Tremor Mother        benign essential   Colon polyps Mother 59   Heart disease Father        stents   Diabetes Father        17   Stroke Father    Gout Father    Blindness Father    Gout Brother    Cancer Maternal Grandfather  Multiple Myeloma   Stroke Paternal Grandmother    Cancer Paternal Grandfather        bladder cancer   COPD Son    Esophageal cancer Neg Hx    Colon cancer Neg Hx    Rectal cancer Neg Hx    Stomach cancer Neg Hx     Social History   Socioeconomic History   Marital status: Married    Spouse name: Not on file   Number of children: Not on file   Years of education: Not on file   Highest education level: Not on file  Occupational History   Occupation: RN [employer]  Tobacco Use   Smoking status: Never   Smokeless tobacco: Never  Vaping Use   Vaping status:  Never Used  Substance and Sexual Activity   Alcohol use: Yes    Alcohol/week: 4.0 standard drinks of alcohol    Types: 4 Glasses of wine per week   Drug use: No   Sexual activity: Yes    Comment: lives with husband, no red meat, works as Charity fundraiser at [employer]  Other Topics Concern   Not on file  Social History Narrative   Not on file   Social Determinants of Health   Financial Resource Strain: Not on file  Food Insecurity: Not on file  Transportation Needs: Not on file  Physical Activity: Not on file  Stress: Not on file  Social Connections: Not on file  Intimate Partner Violence: Not on file    Outpatient Medications Prior to Visit  Medication Sig Dispense Refill   amitriptyline (ELAVIL) 10 MG tablet TAKE 0.5-2 TABLETS (5-20 MG TOTAL) BY MOUTH AT BEDTIME. 180 tablet 1   Continuous Glucose Receiver (FREESTYLE LIBRE 3 READER) DEVI 1 each by Does not apply route as directed. 1 each 0   Continuous Glucose Sensor (FREESTYLE LIBRE 3 SENSOR) MISC Use as directed 2 each 5   sertraline (ZOLOFT) 25 MG tablet Take 1 tablet (25 mg total) by mouth daily. 90 tablet 0   valACYclovir (VALTREX) 1000 MG tablet TAKE 1 TABLET (1,000 MG TOTAL) BY MOUTH DAILY AS NEEDED. 90 tablet 1   lisdexamfetamine (VYVANSE) 30 MG capsule Take 1 capsule (30 mg total) by mouth daily. 30 capsule 0   lisdexamfetamine (VYVANSE) 30 MG capsule Take 1 capsule (30 mg total) by mouth daily. September 2024 30 capsule 0   lisdexamfetamine (VYVANSE) 30 MG capsule Take 1 capsule (30 mg total) by mouth daily. October 2024 30 capsule 0   No facility-administered medications prior to visit.    Allergies  Allergen Reactions   No Known Allergies     Review of Systems  Constitutional:  Negative for fever and malaise/fatigue.  HENT:  Negative for congestion.   Eyes:  Negative for blurred vision.  Respiratory:  Negative for shortness of breath.   Cardiovascular:  Negative for chest pain, palpitations and leg swelling.   Gastrointestinal:  Negative for abdominal pain, blood in stool and nausea.  Genitourinary:  Negative for dysuria and frequency.  Musculoskeletal:  Negative for falls.  Skin:  Negative for rash.  Neurological:  Negative for dizziness, loss of consciousness and headaches.  Endo/Heme/Allergies:  Negative for environmental allergies.  Psychiatric/Behavioral:  Negative for depression. The patient is not nervous/anxious.       Objective:    Physical Exam Constitutional:      General: She is not in acute distress.    Appearance: Normal appearance. She is well-developed. She is not toxic-appearing.  HENT:     Head: Normocephalic and atraumatic.     Right Ear: External ear normal.     Left Ear: External ear normal.     Nose: Nose normal.  Eyes:     General:        Right eye: No discharge.        Left eye: No discharge.     Conjunctiva/sclera: Conjunctivae normal.  Neck:     Thyroid: No thyromegaly.  Cardiovascular:     Rate and Rhythm: Normal rate and regular rhythm.     Heart sounds: Normal heart sounds. No murmur heard. Pulmonary:     Effort: Pulmonary effort is normal. No respiratory distress.     Breath sounds: Normal breath sounds.  Abdominal:     General: Bowel sounds are normal.     Palpations: Abdomen is soft.     Tenderness: There is no abdominal tenderness. There is no guarding.  Musculoskeletal:        General: Normal range of motion.     Cervical back: Neck supple.  Lymphadenopathy:     Cervical: No cervical adenopathy.  Skin:    General: Skin is warm and dry.  Neurological:     Mental Status: She is alert and oriented to person, place, and time.  Psychiatric:        Mood and Affect: Mood normal.        Behavior: Behavior normal.        Thought Content: Thought content normal.        Judgment: Judgment normal.   There were no vitals taken for this visit. Wt Readings from Last 3 Encounters:  07/11/22 118 lb (53.5 kg)  07/01/22 121 lb (54.9 kg)  05/27/22 121  lb (54.9 kg)    Diabetic Foot Exam - Simple   No data filed    Lab Results  Component Value Date   WBC 5.7 01/11/2023   HGB 11.9 (L) 01/11/2023   HCT 36.1 01/11/2023   PLT 291.0 01/11/2023   GLUCOSE 102 (H) 01/11/2023   CHOL 149 01/11/2023   TRIG 42.0 01/11/2023   HDL 49.80 01/11/2023   LDLCALC 91 01/11/2023   ALT 21 01/11/2023   AST 25 01/11/2023   NA 141 01/11/2023   K 4.4 01/11/2023   CL 107 01/11/2023   CREATININE 0.59 01/11/2023   BUN 15 01/11/2023   CO2 26 01/11/2023   TSH 2.69 01/11/2023   HGBA1C 6.0 01/11/2023    Lab Results  Component Value Date   TSH 2.69 01/11/2023   Lab Results  Component Value Date   WBC 5.7 01/11/2023   HGB 11.9 (L) 01/11/2023   HCT 36.1 01/11/2023   MCV 87.6 01/11/2023   PLT 291.0 01/11/2023   Lab Results  Component Value Date   NA 141 01/11/2023   K 4.4 01/11/2023   CO2 26 01/11/2023   GLUCOSE 102 (H) 01/11/2023   BUN 15 01/11/2023   CREATININE 0.59 01/11/2023   BILITOT 0.3 01/11/2023   ALKPHOS 44 01/11/2023   AST 25 01/11/2023   ALT 21 01/11/2023   PROT 6.7 01/11/2023   ALBUMIN 4.2 01/11/2023   CALCIUM 9.0 01/11/2023   GFR 107.98 01/11/2023   Lab Results  Component Value Date   CHOL 149 01/11/2023   Lab Results  Component Value Date   HDL 49.80 01/11/2023   Lab Results  Component Value Date   LDLCALC 91 01/11/2023   Lab Results  Component Value Date   TRIG 42.0 01/11/2023  Lab Results  Component Value Date   CHOLHDL 3 01/11/2023   Lab Results  Component Value Date   HGBA1C 6.0 01/11/2023       Assessment & Plan:  Attention deficit hyperactivity disorder (ADHD), unspecified ADHD type Assessment & Plan: Tolerating Vyvanse    Dyslipidemia Assessment & Plan: Encourage heart healthy diet such as MIND or DASH diet, increase exercise, avoid trans fats, simple carbohydrates and processed foods, consider a krill or fish or flaxseed oil cap daily.     Hyperglycemia Assessment & Plan: hgba1c  acceptable, minimize simple carbs. Increase exercise as tolerated.    Insomnia, unspecified type Assessment & Plan: Encouraged good sleep hygiene such as dark, quiet room. No blue/green glowing lights such as computer screens in bedroom. No alcohol or stimulants in evening. Cut down on caffeine as able. Regular exercise is helpful but not just prior to bed time.  Amitriptyline prn   Anxiety Assessment & Plan: Continue Sertraline   Other orders -     Lisdexamfetamine Dimesylate; Take 1 capsule (30 mg total) by mouth daily.  Dispense: 30 capsule; Refill: 0 -     Lisdexamfetamine Dimesylate; Take 1 capsule (30 mg total) by mouth daily. December 2024  Dispense: 30 capsule; Refill: 0 -     Lisdexamfetamine Dimesylate; Take 1 capsule (30 mg total) by mouth daily. January 2025  Dispense: 30 capsule; Refill: 0    Assessment and Plan    Perimenopause Discussed potential symptoms and effects on muscle mass. No current symptoms of hot flashes or mood changes. Discussed potential risks and benefits of hormone replacement therapy, including increased risk of breast cancer and blood clots. No current plan to start hormone replacement therapy due to lack of symptoms and potential risks. -Consider hormone levels testing if symptoms develop, understanding that insurance coverage may be uncertain without symptoms.  ADHD Stable on current Vyvanse regimen. -Continue Vyvanse as prescribed. -Send prescriptions for November, December, and January to the med center.  Mental Health Expressed interest in finding a therapist outside of the med center. -Consider looking at [facility] website for potential therapists. -Continue current mental health management plan.  General Health Maintenance / Followup Plans -Physical scheduled for January. -Check in if any changes before January. -Return to every six month visits after January, provided no changes.         Danise Edge, MD

## 2023-04-18 ENCOUNTER — Other Ambulatory Visit (HOSPITAL_BASED_OUTPATIENT_CLINIC_OR_DEPARTMENT_OTHER): Payer: Self-pay

## 2023-04-18 ENCOUNTER — Other Ambulatory Visit: Payer: Self-pay | Admitting: Family Medicine

## 2023-04-18 MED ORDER — LISDEXAMFETAMINE DIMESYLATE 30 MG PO CAPS: 30.0000 mg | ORAL_CAPSULE | Freq: Every day | ORAL | 0 refills | Status: DC

## 2023-04-18 NOTE — Telephone Encounter (Signed)
Pharmacy comment: **We rec'd Rx's today for Nov, Dec & Jan. This Rx was ready for pick up but was DC'd in Epic. Please resend an Rx for October if pt needs before Nov, Thanks!

## 2023-05-31 ENCOUNTER — Other Ambulatory Visit (HOSPITAL_BASED_OUTPATIENT_CLINIC_OR_DEPARTMENT_OTHER): Payer: Self-pay

## 2023-07-16 NOTE — Assessment & Plan Note (Signed)
Patient encouraged to maintain heart healthy diet, regular exercise, adequate sleep. Consider daily probiotics. Take medications as prescribed.. Labs ordered and reviewed. Last MGM in 2022 repeat this year. Colonoscopy 06/2022 repeat in 10 years. Pap May 2021 Given and reviewed copy of ACP documents from [facility] and encouraged to complete and return  Consider annual Covid and flu shots

## 2023-07-16 NOTE — Assessment & Plan Note (Signed)
Encourage heart healthy diet such as MIND or DASH diet, increase exercise, avoid trans fats, simple carbohydrates and processed foods, consider a krill or fish or flaxseed oil cap daily.  °

## 2023-07-16 NOTE — Assessment & Plan Note (Signed)
hgba1c acceptable, minimize simple carbs. Increase exercise as tolerated.  

## 2023-07-16 NOTE — Assessment & Plan Note (Signed)
Tolerating Vyvanse

## 2023-07-17 ENCOUNTER — Other Ambulatory Visit (HOSPITAL_COMMUNITY)
Admission: RE | Admit: 2023-07-17 | Discharge: 2023-07-17 | Disposition: A | Discharge status: Home / Self Care | Payer: 59 | Source: Ambulatory Visit | Attending: Family Medicine | Admitting: Family Medicine

## 2023-07-17 ENCOUNTER — Encounter: Payer: Self-pay | Admitting: Family Medicine

## 2023-07-17 ENCOUNTER — Other Ambulatory Visit (HOSPITAL_BASED_OUTPATIENT_CLINIC_OR_DEPARTMENT_OTHER): Payer: Self-pay

## 2023-07-17 ENCOUNTER — Ambulatory Visit (INDEPENDENT_AMBULATORY_CARE_PROVIDER_SITE_OTHER): Payer: 59 | Admitting: Family Medicine

## 2023-07-17 VITALS — BP 110/64 | HR 86 | Temp 98.4°F | Resp 18 | Ht 63.0 in | Wt 125.6 lb

## 2023-07-17 DIAGNOSIS — Z0000 Encounter for general adult medical examination without abnormal findings: Secondary | ICD-10-CM

## 2023-07-17 DIAGNOSIS — Z79899 Other long term (current) drug therapy: Secondary | ICD-10-CM | POA: Diagnosis not present

## 2023-07-17 DIAGNOSIS — F909 Attention-deficit hyperactivity disorder, unspecified type: Secondary | ICD-10-CM

## 2023-07-17 DIAGNOSIS — R739 Hyperglycemia, unspecified: Secondary | ICD-10-CM | POA: Diagnosis present

## 2023-07-17 DIAGNOSIS — E785 Hyperlipidemia, unspecified: Secondary | ICD-10-CM

## 2023-07-17 MED ORDER — LISDEXAMFETAMINE DIMESYLATE 30 MG PO CAPS
30.0000 mg | ORAL_CAPSULE | Freq: Every day | ORAL | 0 refills | Status: DC
  Filled 2023-07-17 – 2023-10-23 (×2): qty 30, 30d supply, fill #0

## 2023-07-17 MED ORDER — LISDEXAMFETAMINE DIMESYLATE 30 MG PO CAPS
30.0000 mg | ORAL_CAPSULE | Freq: Every day | ORAL | 0 refills | Status: DC
  Filled 2023-07-17 (×2): qty 30, 30d supply, fill #0

## 2023-07-17 MED ORDER — LISDEXAMFETAMINE DIMESYLATE 30 MG PO CAPS
30.0000 mg | ORAL_CAPSULE | Freq: Every day | ORAL | 0 refills | Status: DC
  Filled 2023-07-17 – 2023-08-25 (×2): qty 30, 30d supply, fill #0

## 2023-07-17 MED ORDER — LISDEXAMFETAMINE DIMESYLATE 30 MG PO CAPS
30.0000 mg | ORAL_CAPSULE | Freq: Every day | ORAL | 0 refills | Status: DC
  Filled 2023-07-17: qty 30, 30d supply, fill #0

## 2023-07-17 NOTE — Patient Instructions (Signed)
Preventive Care 40-48 Years Old, Female Preventive care refers to lifestyle choices and visits with your health care provider that can promote health and wellness. Preventive care visits are also called wellness exams. What can I expect for my preventive care visit? Counseling Your health care provider may ask you questions about your: Medical history, including: Past medical problems. Family medical history. Pregnancy history. Current health, including: Menstrual cycle. Method of birth control. Emotional well-being. Home life and relationship well-being. Sexual activity and sexual health. Lifestyle, including: Alcohol, nicotine or tobacco, and drug use. Access to firearms. Diet, exercise, and sleep habits. Work and work environment. Sunscreen use. Safety issues such as seatbelt and bike helmet use. Physical exam Your health care provider will check your: Height and weight. These may be used to calculate your BMI (body mass index). BMI is a measurement that tells if you are at a healthy weight. Waist circumference. This measures the distance around your waistline. This measurement also tells if you are at a healthy weight and may help predict your risk of certain diseases, such as type 2 diabetes and high blood pressure. Heart rate and blood pressure. Body temperature. Skin for abnormal spots. What immunizations do I need?  Vaccines are usually given at various ages, according to a schedule. Your health care provider will recommend vaccines for you based on your age, medical history, and lifestyle or other factors, such as travel or where you work. What tests do I need? Screening Your health care provider may recommend screening tests for certain conditions. This may include: Lipid and cholesterol levels. Diabetes screening. This is done by checking your blood sugar (glucose) after you have not eaten for a while (fasting). Pelvic exam and Pap test. Hepatitis B test. Hepatitis C  test. HIV (human immunodeficiency virus) test. STI (sexually transmitted infection) testing, if you are at risk. Lung cancer screening. Colorectal cancer screening. Mammogram. Talk with your health care provider about when you should start having regular mammograms. This may depend on whether you have a family history of breast cancer. BRCA-related cancer screening. This may be done if you have a family history of breast, ovarian, tubal, or peritoneal cancers. Bone density scan. This is done to screen for osteoporosis. Talk with your health care provider about your test results, treatment options, and if necessary, the need for more tests. Follow these instructions at home: Eating and drinking  Eat a diet that includes fresh fruits and vegetables, whole grains, lean protein, and low-fat dairy products. Take vitamin and mineral supplements as recommended by your health care provider. Do not drink alcohol if: Your health care provider tells you not to drink. You are pregnant, may be pregnant, or are planning to become pregnant. If you drink alcohol: Limit how much you have to 0-1 drink a day. Know how much alcohol is in your drink. In the [place] one drink equals one 12 oz bottle of beer (355 mL), one 5 oz glass of wine (148 mL), or one 1 oz glass of hard liquor (44 mL). Lifestyle Brush your teeth every morning and night with fluoride toothpaste. Floss one time each day. Exercise for at least 30 minutes 5 or more days each week. Do not use any products that contain nicotine or tobacco. These products include cigarettes, chewing tobacco, and vaping devices, such as e-cigarettes. If you need help quitting, ask your health care provider. Do not use drugs. If you are sexually active, practice safe sex. Use a condom or other form of protection to   prevent STIs. If you do not wish to become pregnant, use a form of birth control. If you plan to become pregnant, see your health care provider for a  prepregnancy visit. Take aspirin only as told by your health care provider. Make sure that you understand how much to take and what form to take. Work with your health care provider to find out whether it is safe and beneficial for you to take aspirin daily. Find healthy ways to manage stress, such as: Meditation, yoga, or listening to music. Journaling. Talking to a trusted person. Spending time with friends and family. Minimize exposure to UV radiation to reduce your risk of skin cancer. Safety Always wear your seat belt while driving or riding in a vehicle. Do not drive: If you have been drinking alcohol. Do not ride with someone who has been drinking. When you are tired or distracted. While texting. If you have been using any mind-altering substances or drugs. Wear a helmet and other protective equipment during sports activities. If you have firearms in your house, make sure you follow all gun safety procedures. Seek help if you have been physically or sexually abused. What's next? Visit your health care provider once a year for an annual wellness visit. Ask your health care provider how often you should have your eyes and teeth checked. Stay up to date on all vaccines. This information is not intended to replace advice given to you by your health care provider. Make sure you discuss any questions you have with your health care provider. Document Revised: 12/09/2020 Document Reviewed: 12/09/2020 Elsevier Patient Education  2024 Elsevier Inc.  

## 2023-07-18 ENCOUNTER — Encounter: Payer: Self-pay | Admitting: Family Medicine

## 2023-07-18 LAB — COMPREHENSIVE METABOLIC PANEL
ALT: 19 U/L (ref 0–35)
AST: 23 U/L (ref 0–37)
Albumin: 4.3 g/dL (ref 3.5–5.2)
Alkaline Phosphatase: 42 U/L (ref 39–117)
BUN: 14 mg/dL (ref 6–23)
CO2: 28 meq/L (ref 19–32)
Calcium: 9.5 mg/dL (ref 8.4–10.5)
Chloride: 102 meq/L (ref 96–112)
Creatinine, Ser: 0.54 mg/dL (ref 0.40–1.20)
GFR: 109.91 mL/min (ref >60.00)
Glucose, Bld: 95 mg/dL (ref 70–99)
Potassium: 4.7 meq/L (ref 3.5–5.1)
Sodium: 139 meq/L (ref 135–145)
Total Bilirubin: 0.4 mg/dL (ref 0.2–1.2)
Total Protein: 6.7 g/dL (ref 6.0–8.3)

## 2023-07-18 LAB — CBC WITH DIFFERENTIAL/PLATELET
Basophils Absolute: 0 10*3/uL (ref 0.0–0.1)
Basophils Relative: 0.5 % (ref 0.0–3.0)
Eosinophils Absolute: 0.5 10*3/uL (ref 0.0–0.7)
Eosinophils Relative: 6.9 % — ABNORMAL HIGH (ref 0.0–5.0)
HCT: 38.4 % (ref 36.0–46.0)
Hemoglobin: 12.5 g/dL (ref 12.0–15.0)
Lymphocytes Relative: 27.4 % (ref 12.0–46.0)
Lymphs Abs: 2 10*3/uL (ref 0.7–4.0)
MCHC: 32.5 g/dL (ref 30.0–36.0)
MCV: 88.8 fL (ref 78.0–100.0)
Monocytes Absolute: 0.6 10*3/uL (ref 0.1–1.0)
Monocytes Relative: 7.7 % (ref 3.0–12.0)
Neutro Abs: 4.3 10*3/uL (ref 1.4–7.7)
Neutrophils Relative %: 57.5 % (ref 43.0–77.0)
Platelets: 291 10*3/uL (ref 150.0–400.0)
RBC: 4.32 Mil/uL (ref 3.87–5.11)
RDW: 15.5 % (ref 11.5–15.5)
WBC: 7.4 10*3/uL (ref 4.0–10.5)

## 2023-07-18 LAB — LIPID PANEL
Cholesterol: 131 mg/dL (ref 0–200)
HDL: 49.4 mg/dL (ref >39.00)
LDL Cholesterol: 67 mg/dL (ref 0–99)
NonHDL: 81.47
Total CHOL/HDL Ratio: 3
Triglycerides: 73 mg/dL (ref 0.0–149.0)
VLDL: 14.6 mg/dL (ref 0.0–40.0)

## 2023-07-18 LAB — TSH: TSH: 2.83 u[IU]/mL (ref 0.35–5.50)

## 2023-07-18 LAB — HEMOGLOBIN A1C: Hgb A1c MFr Bld: 6.2 % (ref 4.6–6.5)

## 2023-07-18 NOTE — Progress Notes (Signed)
Subjective:    Patient ID: Sandra Long, female    DOB: October 06, 1975, 48 y.o.   MRN: [id number]  Chief Complaint  Patient presents with  . Annual Exam    HPI Discussed the use of AI scribe software for clinical note transcription with the patient, who gave verbal consent to proceed.  History of Present Illness   The patient, a 48 year old woman with a family history of diabetes, presents with concerns about her rising A1c levels. She has been proactive in managing her blood sugar levels, including monitoring her sleep and eating more frequently. Despite these efforts, she has not seen a significant improvement in her A1c levels. She has also been experiencing hearing issues, which she has not yet had checked.  The patient's mother has been experiencing health issues, including pernicious anemia and potential gastric cancer. Her mother has been undergoing tests and treatments, but a definitive diagnosis has not yet been made.  The patient's eldest child, a 63 year old, has been diagnosed with depression and anxiety. The patient's other children, aged 7 and 40, do not have any reported health issues.        Past Medical History:  Diagnosis Date  . Anemia 06/07/2016  . Cervical disc disease 11/25/2014   MRI 2014  1.  Moderate left central and foraminal stenosis due to a large left lateral recess disc protrusion at C6-7, potentially with a small amount of marginal blood products - correlate with chronicity of onset of patient's symptoms. 2.  There is also moderate right eccentric central stenosis at the C5-6 level due to a right paracentral disc protrusion. 3.  Chronic ethmoid sinusitis. 4.  Mild   . Dog bite 10/20/2016  . Dyslipidemia 06/07/2016  . Eye disease 12/07/2014   Macular rosacea  . Gestational diabetes 2009  . Hyperglycemia 06/07/2016  . Palpitations 11/25/2014  . Paresthesia 11/25/2014  . Preventative health care 12/07/2014    Past Surgical History:  Procedure  Laterality Date  . CESAREAN SECTION  2007  . CESAREAN SECTION N/A 09/08/2015   Procedure: Repeat CESAREAN SECTION;  Surgeon: Olivia Mackie, MD;  Location: [facility];  Service: Obstetrics;  Laterality: N/A;  EDD: 09/14/15  . WISDOM TOOTH EXTRACTION      Family History  Problem Relation Age of Onset  . Heart disease Mother        2 stents 2015  . Tremor Mother        benign essential  . Colon polyps Mother 81  . Pernicious anemia Mother   . Heart disease Father        stents  . Diabetes Father        30  . Stroke Father   . Gout Father   . Blindness Father   . Depression Brother   . Anxiety disorder Brother   . Gout Brother   . Other Brother        gout  . Depression Brother   . Anxiety disorder Brother   . Depression Son   . Anxiety disorder Son   . Cancer Maternal Grandfather        Multiple Myeloma  . Stroke Paternal Grandmother   . Cancer Paternal Grandfather        bladder cancer  . Esophageal cancer Neg Hx   . Colon cancer Neg Hx   . Rectal cancer Neg Hx   . Stomach cancer Neg Hx     Social History   Socioeconomic History  . Marital status: Married  Spouse name: Not on file  . Number of children: Not on file  . Years of education: Not on file  . Highest education level: Not on file  Occupational History  . Occupation: RN [employer]  Tobacco Use  . Smoking status: Never  . Smokeless tobacco: Never  Vaping Use  . Vaping status: Never Used  Substance and Sexual Activity  . Alcohol use: Yes    Alcohol/week: 4.0 standard drinks of alcohol    Types: 4 Glasses of wine per week  . Drug use: No  . Sexual activity: Yes    Comment: lives with husband, no red meat, works as Charity fundraiser at [employer]  Other Topics Concern  . Not on file  Social History Narrative  . Not on file   Social Drivers of Health   Financial Resource Strain: Not on file  Food Insecurity: Not on file  Transportation Needs: Not on file  Physical Activity: Not  on file  Stress: Not on file  Social Connections: Not on file  Intimate Partner Violence: Not on file    Outpatient Medications Prior to Visit  Medication Sig Dispense Refill  . amitriptyline (ELAVIL) 10 MG tablet TAKE 0.5-2 TABLETS (5-20 MG TOTAL) BY MOUTH AT BEDTIME. 180 tablet 1  . Continuous Glucose Receiver (FREESTYLE LIBRE 3 READER) DEVI 1 each by Does not apply route as directed. 1 each 0  . Continuous Glucose Sensor (FREESTYLE LIBRE 3 SENSOR) MISC Use as directed 2 each 5  . sertraline (ZOLOFT) 25 MG tablet Take 1 tablet (25 mg total) by mouth daily. 90 tablet 0  . valACYclovir (VALTREX) 1000 MG tablet TAKE 1 TABLET (1,000 MG TOTAL) BY MOUTH DAILY AS NEEDED. 90 tablet 1  . lisdexamfetamine (VYVANSE) 30 MG capsule Take 1 capsule (30 mg total) by mouth daily. December 2024 30 capsule 0  . lisdexamfetamine (VYVANSE) 30 MG capsule Take 1 capsule (30 mg total) by mouth daily. January 2025 30 capsule 0  . lisdexamfetamine (VYVANSE) 30 MG capsule Take 1 capsule (30 mg total) by mouth daily. 30 capsule 0   No facility-administered medications prior to visit.    Allergies  Allergen Reactions  . No Known Allergies     Review of Systems  Constitutional:  Negative for fever and malaise/fatigue.  HENT:  Negative for congestion.   Eyes:  Negative for blurred vision.  Respiratory:  Negative for shortness of breath.   Cardiovascular:  Negative for chest pain, palpitations and leg swelling.  Gastrointestinal:  Negative for abdominal pain, blood in stool and nausea.  Genitourinary:  Negative for dysuria and frequency.  Musculoskeletal:  Negative for falls.  Skin:  Negative for rash.  Neurological:  Negative for dizziness, loss of consciousness and headaches.  Endo/Heme/Allergies:  Negative for environmental allergies.  Psychiatric/Behavioral:  Negative for depression. The patient is not nervous/anxious.       Objective:    Physical Exam Constitutional:      General: She is not in  acute distress.    Appearance: Normal appearance. She is not diaphoretic.  HENT:     Head: Normocephalic and atraumatic.     Right Ear: Tympanic membrane, ear canal and external ear normal.     Left Ear: Tympanic membrane, ear canal and external ear normal.     Nose: Nose normal.     Mouth/Throat:     Mouth: Mucous membranes are moist.     Pharynx: Oropharynx is clear. No oropharyngeal exudate.  Eyes:  General: No scleral icterus.       Right eye: No discharge.        Left eye: No discharge.     Conjunctiva/sclera: Conjunctivae normal.     Pupils: Pupils are equal, round, and reactive to light.  Neck:     Thyroid: No thyromegaly.  Cardiovascular:     Rate and Rhythm: Normal rate and regular rhythm.     Heart sounds: Normal heart sounds. No murmur heard. Pulmonary:     Effort: Pulmonary effort is normal. No respiratory distress.     Breath sounds: Normal breath sounds. No wheezing or rales.  Abdominal:     General: Bowel sounds are normal. There is no distension.     Palpations: Abdomen is soft. There is no mass.     Tenderness: There is no abdominal tenderness.  Genitourinary:    General: Normal vulva.     Vagina: No vaginal discharge.     Rectum: Normal.  Musculoskeletal:        General: No tenderness. Normal range of motion.     Cervical back: Normal range of motion and neck supple.  Lymphadenopathy:     Cervical: No cervical adenopathy.  Skin:    General: Skin is warm and dry.     Findings: No rash.  Neurological:     General: No focal deficit present.     Mental Status: She is alert and oriented to person, place, and time.     Cranial Nerves: No cranial nerve deficit.     Coordination: Coordination normal.     Deep Tendon Reflexes: Reflexes are normal and symmetric. Reflexes normal.  Psychiatric:        Mood and Affect: Mood normal.        Behavior: Behavior normal.        Thought Content: Thought content normal.        Judgment: Judgment normal.   BP 110/64  (BP Location: Right Arm, Patient Position: Sitting, Cuff Size: Normal)   Pulse 86   Temp 98.4 F (36.9 C) (Oral)   Resp 18   Ht 5\' 3"  (1.6 m)   Wt 125 lb 9.6 oz (57 kg)   SpO2 98%   BMI 22.25 kg/m  Wt Readings from Last 3 Encounters:  07/17/23 125 lb 9.6 oz (57 kg)  07/11/22 118 lb (53.5 kg)  07/01/22 121 lb (54.9 kg)    Diabetic Foot Exam - Simple   No data filed    Lab Results  Component Value Date   WBC 7.4 07/17/2023   HGB 12.5 07/17/2023   HCT 38.4 07/17/2023   PLT 291.0 07/17/2023   GLUCOSE 95 07/17/2023   CHOL 131 07/17/2023   TRIG 73.0 07/17/2023   HDL 49.40 07/17/2023   LDLCALC 67 07/17/2023   ALT 19 07/17/2023   AST 23 07/17/2023   NA 139 07/17/2023   K 4.7 07/17/2023   CL 102 07/17/2023   CREATININE 0.54 07/17/2023   BUN 14 07/17/2023   CO2 28 07/17/2023   TSH 2.83 07/17/2023   HGBA1C 6.2 07/17/2023    Lab Results  Component Value Date   TSH 2.83 07/17/2023   Lab Results  Component Value Date   WBC 7.4 07/17/2023   HGB 12.5 07/17/2023   HCT 38.4 07/17/2023   MCV 88.8 07/17/2023   PLT 291.0 07/17/2023   Lab Results  Component Value Date   NA 139 07/17/2023   K 4.7 07/17/2023   CO2 28 07/17/2023   GLUCOSE 95  07/17/2023   BUN 14 07/17/2023   CREATININE 0.54 07/17/2023   BILITOT 0.4 07/17/2023   ALKPHOS 42 07/17/2023   AST 23 07/17/2023   ALT 19 07/17/2023   PROT 6.7 07/17/2023   ALBUMIN 4.3 07/17/2023   CALCIUM 9.5 07/17/2023   GFR 109.91 07/17/2023   Lab Results  Component Value Date   CHOL 131 07/17/2023   Lab Results  Component Value Date   HDL 49.40 07/17/2023   Lab Results  Component Value Date   LDLCALC 67 07/17/2023   Lab Results  Component Value Date   TRIG 73.0 07/17/2023   Lab Results  Component Value Date   CHOLHDL 3 07/17/2023   Lab Results  Component Value Date   HGBA1C 6.2 07/17/2023       Assessment & Plan:  Attention deficit hyperactivity disorder (ADHD), unspecified ADHD type Assessment &  Plan: Tolerating Vyvanse   Orders: -     Drug Monitoring Panel [phone] , Urine  Dyslipidemia Assessment & Plan: Encourage heart healthy diet such as MIND or DASH diet, increase exercise, avoid trans fats, simple carbohydrates and processed foods, consider a krill or fish or flaxseed oil cap daily.    Orders: -     CBC with Differential/Platelet; Future -     TSH; Future -     Lipid panel; Future  Hyperglycemia Assessment & Plan: hgba1c acceptable, minimize simple carbs. Increase exercise as tolerated.   Orders: -     CBC with Differential/Platelet; Future -     Comprehensive metabolic panel; Future -     TSH; Future -     Hemoglobin A1c; Future -     Cytology - PAP  Preventative health care Assessment & Plan: Patient encouraged to maintain heart healthy diet, regular exercise, adequate sleep. Consider daily probiotics. Take medications as prescribed.. Labs ordered and reviewed. Last MGM in 2022 repeat this year. Colonoscopy 06/2022 repeat in 10 years. Pap May 2021 Given and reviewed copy of ACP documents from [facility] Secretary of [place] and encouraged to complete and return  Consider annual Covid and flu shots    High risk medication use -     Drug Monitoring Panel [phone] , Urine  Other orders -     Lisdexamfetamine Dimesylate; Take 1 capsule (30 mg total) by mouth daily. March 2025  Dispense: 30 capsule; Refill: 0 -     Lisdexamfetamine Dimesylate; Take 1 capsule (30 mg total) by mouth daily. April 2025  Dispense: 30 capsule; Refill: 0 -     Lisdexamfetamine Dimesylate; Take 1 capsule (30 mg total) by mouth daily. February 2025  Dispense: 30 capsule; Refill: 0    Assessment and Plan    Prediabetes Rising A1c despite efforts to improve sleep and increase physical activity. Fasting glucose levels consistently above 100. Strong family history of diabetes. -Order A1c today to assess current level. -Continue efforts to improve sleep and increase physical activity. -Advise to eat  more frequently, focusing on protein intake every 4-6 hours.  Hearing Concerns Reports suboptimal hearing, but has not had a formal evaluation. -Recommend hearing evaluation at [facility] or similar facility.  General Health Maintenance -Continue Vyvanse as prescribed, with noted improvement in symptoms. -Order standard lab work today. -Schedule follow-up appointment in 6 months.  Cervical Cancer screen, pap performed today.         Danise Edge, MD

## 2023-07-20 LAB — DRUG MONITORING PANEL 376104, URINE
Amphetamines: NEGATIVE ng/mL (ref <500)
Barbiturates: NEGATIVE ng/mL (ref <300)
Benzodiazepines: NEGATIVE ng/mL (ref <100)
Cocaine Metabolite: NEGATIVE ng/mL (ref <150)
Desmethyltramadol: NEGATIVE ng/mL (ref <100)
Opiates: NEGATIVE ng/mL (ref <100)
Oxycodone: NEGATIVE ng/mL (ref <100)
Tramadol: NEGATIVE ng/mL (ref <100)

## 2023-07-20 LAB — CYTOLOGY - PAP: Diagnosis: NEGATIVE

## 2023-07-20 LAB — DM TEMPLATE

## 2023-08-02 ENCOUNTER — Telehealth: Payer: Self-pay

## 2023-08-02 NOTE — Telephone Encounter (Signed)
 Copied from CRM [id number]. Topic: Clinical - Medical Advice >> Aug 02, 2023 11:52 AM Thersia BROCKS wrote: Reason for CRM: Patient called in wanting to speak with Dr. Domenica and or nurses about her recent labs , has some concerns and would like a callback to speak about this matter

## 2023-08-03 ENCOUNTER — Other Ambulatory Visit: Payer: Self-pay | Admitting: Emergency Medicine

## 2023-08-03 ENCOUNTER — Encounter: Payer: Self-pay | Admitting: Family Medicine

## 2023-08-03 MED ORDER — VALACYCLOVIR HCL 1 G PO TABS: 1000.0000 mg | ORAL_TABLET | Freq: Every day | ORAL | 1 refills | Status: AC | PRN

## 2023-08-03 NOTE — Telephone Encounter (Signed)
 Called patient. She expressed her concerns about her A1C. She said she sent the provider a note. This CMA scheduled virtual visit for 08/15/2023 so she could speak with the provider about other concerns she is having.

## 2023-08-07 ENCOUNTER — Other Ambulatory Visit: Payer: Self-pay | Admitting: Family Medicine

## 2023-08-07 DIAGNOSIS — R739 Hyperglycemia, unspecified: Secondary | ICD-10-CM

## 2023-08-13 NOTE — Assessment & Plan Note (Signed)
-  Stable on Sertraline

## 2023-08-13 NOTE — Assessment & Plan Note (Signed)
 Encourage heart healthy diet such as MIND or DASH diet, increase exercise, avoid trans fats, simple carbohydrates and processed foods, consider a krill or fish or flaxseed oil cap daily.

## 2023-08-13 NOTE — Assessment & Plan Note (Signed)
Tolerating Vyvanse

## 2023-08-13 NOTE — Assessment & Plan Note (Signed)
 hgba1c acceptable, minimize simple carbs. Increase exercise as tolerated.

## 2023-08-15 ENCOUNTER — Encounter: Payer: Self-pay | Admitting: Family Medicine

## 2023-08-15 ENCOUNTER — Telehealth (INDEPENDENT_AMBULATORY_CARE_PROVIDER_SITE_OTHER): Payer: 59 | Admitting: Family Medicine

## 2023-08-15 DIAGNOSIS — F419 Anxiety disorder, unspecified: Secondary | ICD-10-CM

## 2023-08-15 DIAGNOSIS — R739 Hyperglycemia, unspecified: Secondary | ICD-10-CM | POA: Diagnosis not present

## 2023-08-15 DIAGNOSIS — E785 Hyperlipidemia, unspecified: Secondary | ICD-10-CM | POA: Diagnosis not present

## 2023-08-15 DIAGNOSIS — F909 Attention-deficit hyperactivity disorder, unspecified type: Secondary | ICD-10-CM | POA: Diagnosis not present

## 2023-08-15 NOTE — Patient Instructions (Signed)
Cognitive behavioral therapy - insomnia

## 2023-08-15 NOTE — Progress Notes (Signed)
Virtual Visit via Video Note   This patient is at least at moderate risk for complications without adequate follow up. This format is felt to be most appropriate for this patient at this time. Physical exam was limited by quality of the video and audio technology used for the visit. Juanetta, CMA was able to get the patient set up on a video visit.  Patient location: home Patient and provider in visit Provider location: Office  I discussed the limitations of evaluation and management by telemedicine and the availability of in person appointments. The patient expressed understanding and agreed to proceed.  Visit Date: 08/15/2023  Today's healthcare provider: Danise Edge, MD  Subjective:    Patient ID: Sandra Long, female    DOB: 04-Oct-1975, 48 y.o.   MRN: [id number]  Chief Complaint  Patient presents with   Follow-up    HPI Discussed the use of AI scribe software for clinical note transcription with the patient, who gave verbal consent to proceed.  History of Present Illness   The patient, with a history of ADHD and pre-diabetes, presents with concerns about her elevated A1c levels despite efforts to manage it through lifestyle modifications. The patient reports regular exercise and a healthy diet, but acknowledges high stress levels and poor sleep quality, which she believes may be contributing to her elevated A1c. The patient is currently on Vyvanse for ADHD, but is considering switching to a shorter acting medication due to concerns about its impact on her sleep quality. The patient also reports previous use of Zoloft for anxiety and stress management, and is considering resuming this medication. The patient has a family history of diabetes 1.5 and is considering seeing an endocrinologist for further evaluation.        Past Medical History:  Diagnosis Date   Anemia 06/07/2016   Cervical disc disease 11/25/2014   MRI 2014  1.  Moderate left central and foraminal stenosis due  to a large left lateral recess disc protrusion at C6-7, potentially with a small amount of marginal blood products - correlate with chronicity of onset of patient's symptoms. 2.  There is also moderate right eccentric central stenosis at the C5-6 level due to a right paracentral disc protrusion. 3.  Chronic ethmoid sinusitis. 4.  Mild    Dog bite 10/20/2016   Dyslipidemia 06/07/2016   Eye disease 12/07/2014   Macular rosacea   Gestational diabetes 2009   Hyperglycemia 06/07/2016   Palpitations 11/25/2014   Paresthesia 11/25/2014   Preventative health care 12/07/2014    Past Surgical History:  Procedure Laterality Date   CESAREAN SECTION  2007   CESAREAN SECTION N/A 09/08/2015   Procedure: Repeat CESAREAN SECTION;  Surgeon: Olivia Mackie, MD;  Location: [facility];  Service: Obstetrics;  Laterality: N/A;  EDD: 09/14/15   WISDOM TOOTH EXTRACTION      Family History  Problem Relation Age of Onset   Heart disease Mother        2 stents 2015   Tremor Mother        benign essential   Colon polyps Mother 69   Pernicious anemia Mother    Heart disease Father        stents   Diabetes Father        70   Stroke Father    Gout Father    Blindness Father    Depression Brother    Anxiety disorder Brother    Gout Brother    Other Brother  gout   Depression Brother    Anxiety disorder Brother    Depression Son    Anxiety disorder Son    Cancer Maternal Grandfather        Multiple Myeloma   Stroke Paternal Grandmother    Cancer Paternal Grandfather        bladder cancer   Esophageal cancer Neg Hx    Colon cancer Neg Hx    Rectal cancer Neg Hx    Stomach cancer Neg Hx     Social History   Socioeconomic History   Marital status: Married    Spouse name: Not on file   Number of children: Not on file   Years of education: Not on file   Highest education level: Not on file  Occupational History   Occupation: RN [employer]  Tobacco Use    Smoking status: Never   Smokeless tobacco: Never  Vaping Use   Vaping status: Never Used  Substance and Sexual Activity   Alcohol use: Yes    Alcohol/week: 4.0 standard drinks of alcohol    Types: 4 Glasses of wine per week   Drug use: No   Sexual activity: Yes    Comment: lives with husband, no red meat, works as Charity fundraiser at [employer]  Other Topics Concern   Not on file  Social History Narrative   Not on file   Social Drivers of Corporate investment banker Strain: Not on file  Food Insecurity: Not on file  Transportation Needs: Not on file  Physical Activity: Not on file  Stress: Not on file  Social Connections: Not on file  Intimate Partner Violence: Not on file    Outpatient Medications Prior to Visit  Medication Sig Dispense Refill   amitriptyline (ELAVIL) 10 MG tablet TAKE 0.5-2 TABLETS (5-20 MG TOTAL) BY MOUTH AT BEDTIME. 180 tablet 1   Continuous Glucose Receiver (FREESTYLE LIBRE 3 READER) DEVI 1 each by Does not apply route as directed. 1 each 0   Continuous Glucose Sensor (FREESTYLE LIBRE 3 SENSOR) MISC Use as directed 2 each 5   lisdexamfetamine (VYVANSE) 30 MG capsule Take 1 capsule (30 mg total) by mouth daily. March 2025 30 capsule 0   lisdexamfetamine (VYVANSE) 30 MG capsule Take 1 capsule (30 mg total) by mouth daily. April 2025 30 capsule 0   lisdexamfetamine (VYVANSE) 30 MG capsule Take 1 capsule (30 mg total) by mouth daily. February 2025 30 capsule 0   sertraline (ZOLOFT) 25 MG tablet Take 1 tablet (25 mg total) by mouth daily. 90 tablet 0   valACYclovir (VALTREX) 1000 MG tablet Take 1 tablet (1,000 mg total) by mouth daily as needed. 90 tablet 1   No facility-administered medications prior to visit.    Allergies  Allergen Reactions   No Known Allergies     Review of Systems  Constitutional:  Negative for fever and malaise/fatigue.  HENT:  Negative for congestion.   Eyes:  Negative for blurred vision.  Respiratory:  Negative for shortness of breath.    Cardiovascular:  Negative for chest pain, palpitations and leg swelling.  Gastrointestinal:  Negative for abdominal pain, blood in stool and nausea.  Genitourinary:  Negative for dysuria and frequency.  Musculoskeletal:  Negative for falls.  Skin:  Negative for rash.  Neurological:  Negative for dizziness, loss of consciousness and headaches.  Endo/Heme/Allergies:  Negative for environmental allergies.  Psychiatric/Behavioral:  Negative for depression. The patient is nervous/anxious.  Objective:    Physical Exam Constitutional:      General: She is not in acute distress.    Appearance: Normal appearance. She is not ill-appearing or toxic-appearing.  HENT:     Head: Normocephalic and atraumatic.     Right Ear: External ear normal.     Left Ear: External ear normal.     Nose: Nose normal.  Eyes:     General:        Right eye: No discharge.        Left eye: No discharge.  Pulmonary:     Effort: Pulmonary effort is normal.  Skin:    Findings: No rash.  Neurological:     Mental Status: She is alert and oriented to person, place, and time.  Psychiatric:        Behavior: Behavior normal.     There were no vitals taken for this visit. Wt Readings from Last 3 Encounters:  07/17/23 125 lb 9.6 oz (57 kg)  07/11/22 118 lb (53.5 kg)  07/01/22 121 lb (54.9 kg)    Diabetic Foot Exam - Simple   No data filed    Lab Results  Component Value Date   WBC 7.4 07/17/2023   HGB 12.5 07/17/2023   HCT 38.4 07/17/2023   PLT 291.0 07/17/2023   GLUCOSE 95 07/17/2023   CHOL 131 07/17/2023   TRIG 73.0 07/17/2023   HDL 49.40 07/17/2023   LDLCALC 67 07/17/2023   ALT 19 07/17/2023   AST 23 07/17/2023   NA 139 07/17/2023   K 4.7 07/17/2023   CL 102 07/17/2023   CREATININE 0.54 07/17/2023   BUN 14 07/17/2023   CO2 28 07/17/2023   TSH 2.83 07/17/2023   HGBA1C 6.2 07/17/2023    Lab Results  Component Value Date   TSH 2.83 07/17/2023   Lab Results  Component Value Date    WBC 7.4 07/17/2023   HGB 12.5 07/17/2023   HCT 38.4 07/17/2023   MCV 88.8 07/17/2023   PLT 291.0 07/17/2023   Lab Results  Component Value Date   NA 139 07/17/2023   K 4.7 07/17/2023   CO2 28 07/17/2023   GLUCOSE 95 07/17/2023   BUN 14 07/17/2023   CREATININE 0.54 07/17/2023   BILITOT 0.4 07/17/2023   ALKPHOS 42 07/17/2023   AST 23 07/17/2023   ALT 19 07/17/2023   PROT 6.7 07/17/2023   ALBUMIN 4.3 07/17/2023   CALCIUM 9.5 07/17/2023   GFR 109.91 07/17/2023   Lab Results  Component Value Date   CHOL 131 07/17/2023   Lab Results  Component Value Date   HDL 49.40 07/17/2023   Lab Results  Component Value Date   LDLCALC 67 07/17/2023   Lab Results  Component Value Date   TRIG 73.0 07/17/2023   Lab Results  Component Value Date   CHOLHDL 3 07/17/2023   Lab Results  Component Value Date   HGBA1C 6.2 07/17/2023       Assessment & Plan:  Attention deficit hyperactivity disorder (ADHD), unspecified ADHD type Assessment & Plan: Tolerating Vyvanse    Dyslipidemia Assessment & Plan: Encourage heart healthy diet such as MIND or DASH diet, increase exercise, avoid trans fats, simple carbohydrates and processed foods, consider a krill or fish or flaxseed oil cap daily.    Orders: -     CBC with Differential/Platelet; Future -     Lipid panel; Future -     TSH; Future  Hyperglycemia Assessment & Plan: hgba1c acceptable, minimize simple  carbs. Increase exercise as tolerated.   Orders: -     CBC with Differential/Platelet; Future -     Comprehensive metabolic panel; Future -     TSH; Future -     Hemoglobin A1c; Future  Anxiety Assessment & Plan: Stable on Sertraline     Assessment and Plan    Prediabetes Elevated blood glucose levels, despite lifestyle modifications including diet and exercise. Discussed the role of stress and cortisol levels in blood sugar management. Patient is using a Freestyle Libre glucose monitor. Family history of Diabetes  1.5 in a first cousin. -Continue lifestyle modifications including stress management techniques such as yoga, tai chi, and meditation. -Consider low dose SSRI (Zoloft) to help manage stress levels and potentially improve blood sugar control. -Plan to repeat A1C and other relevant labs in April. -Consider participation in the [facility] [facility] program for genetic screening. -Continue monitoring blood glucose levels with Freestyle Libre. Upgrade to newer model if cost-effective.  ADHD Currently managed with Vyvanse, but patient reports potential impact on sleep and blood glucose levels. -Consider reducing Vyvanse dose to 20mg  or switching to a short-acting medication. -Continue current dose and reassess after starting Zoloft. -Contact provider if sleep issues persist after starting Zoloft for potential adjustment of Vyvanse dose.  Anxiety Patient reports high stress levels and potential impact on blood glucose control. -Restart low dose 25 mg Zoloft to help manage stress levels. -Consider as-needed medication for anxiety if necessary after reassessment.  Sleep Patient reports average sleep duration of approximately 6 hours 15 minutes per night. Sleep is recognized as a potential factor in blood glucose control. -Consider cognitive behavioral therapy for insomnia (CBT-I) to improve sleep duration and quality. -Continue current sleep habits and reassess after starting Zoloft. -Contact provider if sleep issues persist after starting Zoloft for potential adjustment of Vyvanse dose.         Danise Edge, MD

## 2023-08-18 ENCOUNTER — Other Ambulatory Visit: Payer: Self-pay | Admitting: Emergency Medicine

## 2023-08-18 MED ORDER — FREESTYLE LIBRE 3 PLUS SENSOR MISC: 5 refills | Status: DC

## 2023-08-25 ENCOUNTER — Other Ambulatory Visit (HOSPITAL_BASED_OUTPATIENT_CLINIC_OR_DEPARTMENT_OTHER): Payer: Self-pay

## 2023-09-12 ENCOUNTER — Other Ambulatory Visit (HOSPITAL_COMMUNITY): Payer: Self-pay

## 2023-10-23 ENCOUNTER — Encounter: Payer: Self-pay | Admitting: Family Medicine

## 2023-10-23 ENCOUNTER — Other Ambulatory Visit (INDEPENDENT_AMBULATORY_CARE_PROVIDER_SITE_OTHER): Payer: 59

## 2023-10-23 ENCOUNTER — Other Ambulatory Visit (HOSPITAL_BASED_OUTPATIENT_CLINIC_OR_DEPARTMENT_OTHER): Payer: Self-pay

## 2023-10-23 DIAGNOSIS — R739 Hyperglycemia, unspecified: Secondary | ICD-10-CM

## 2023-10-23 DIAGNOSIS — E785 Hyperlipidemia, unspecified: Secondary | ICD-10-CM

## 2023-10-23 LAB — LIPID PANEL
Cholesterol: 132 mg/dL (ref 0–200)
HDL: 52.5 mg/dL (ref >39.00)
LDL Cholesterol: 72 mg/dL (ref 0–99)
NonHDL: 79.84
Total CHOL/HDL Ratio: 3
Triglycerides: 39 mg/dL (ref 0.0–149.0)
VLDL: 7.8 mg/dL (ref 0.0–40.0)

## 2023-10-23 LAB — CBC WITH DIFFERENTIAL/PLATELET
Basophils Absolute: 0 10*3/uL (ref 0.0–0.1)
Basophils Relative: 0.5 % (ref 0.0–3.0)
Eosinophils Absolute: 0.5 10*3/uL (ref 0.0–0.7)
Eosinophils Relative: 10.2 % — ABNORMAL HIGH (ref 0.0–5.0)
HCT: 38.4 % (ref 36.0–46.0)
Hemoglobin: 12.3 g/dL (ref 12.0–15.0)
Lymphocytes Relative: 34.4 % (ref 12.0–46.0)
Lymphs Abs: 1.6 10*3/uL (ref 0.7–4.0)
MCHC: 32 g/dL (ref 30.0–36.0)
MCV: 90.7 fl (ref 78.0–100.0)
Monocytes Absolute: 0.3 10*3/uL (ref 0.1–1.0)
Monocytes Relative: 7.1 % (ref 3.0–12.0)
Neutro Abs: 2.2 10*3/uL (ref 1.4–7.7)
Neutrophils Relative %: 47.8 % (ref 43.0–77.0)
Platelets: 222 10*3/uL (ref 150.0–400.0)
RBC: 4.24 Mil/uL (ref 3.87–5.11)
RDW: 16.1 % — ABNORMAL HIGH (ref 11.5–15.5)
WBC: 4.7 10*3/uL (ref 4.0–10.5)

## 2023-10-23 LAB — TSH: TSH: 1.59 u[IU]/mL (ref 0.35–5.50)

## 2023-10-23 LAB — COMPREHENSIVE METABOLIC PANEL WITH GFR
ALT: 19 U/L (ref 0–35)
AST: 25 U/L (ref 0–37)
Albumin: 4 g/dL (ref 3.5–5.2)
Alkaline Phosphatase: 38 U/L — ABNORMAL LOW (ref 39–117)
BUN: 13 mg/dL (ref 6–23)
CO2: 29 meq/L (ref 19–32)
Calcium: 8.9 mg/dL (ref 8.4–10.5)
Chloride: 104 meq/L (ref 96–112)
Creatinine, Ser: 0.52 mg/dL (ref 0.40–1.20)
GFR: 110.71 mL/min (ref >60.00)
Glucose, Bld: 107 mg/dL — ABNORMAL HIGH (ref 70–99)
Potassium: 4.1 meq/L (ref 3.5–5.1)
Sodium: 139 meq/L (ref 135–145)
Total Bilirubin: 0.4 mg/dL (ref 0.2–1.2)
Total Protein: 6.4 g/dL (ref 6.0–8.3)

## 2023-10-23 LAB — HEMOGLOBIN A1C: Hgb A1c MFr Bld: 6 % (ref 4.6–6.5)

## 2023-10-25 ENCOUNTER — Other Ambulatory Visit (HOSPITAL_BASED_OUTPATIENT_CLINIC_OR_DEPARTMENT_OTHER): Payer: Self-pay

## 2023-10-29 NOTE — Assessment & Plan Note (Signed)
 Encourage heart healthy diet such as MIND or DASH diet, increase exercise, avoid trans fats, simple carbohydrates and processed foods, consider a krill or fish or flaxseed oil cap daily.

## 2023-10-29 NOTE — Assessment & Plan Note (Signed)
 hgba1c acceptable, minimize simple carbs. Increase exercise as tolerated.

## 2023-10-29 NOTE — Assessment & Plan Note (Signed)
 Tolerating Sertraline 

## 2023-10-29 NOTE — Assessment & Plan Note (Signed)
Tolerating Vyvanse

## 2023-10-31 ENCOUNTER — Telehealth: Payer: Self-pay

## 2023-10-31 ENCOUNTER — Encounter: Payer: Self-pay | Admitting: Family Medicine

## 2023-10-31 ENCOUNTER — Telehealth (INDEPENDENT_AMBULATORY_CARE_PROVIDER_SITE_OTHER): Payer: 59 | Admitting: Family Medicine

## 2023-10-31 DIAGNOSIS — F419 Anxiety disorder, unspecified: Secondary | ICD-10-CM | POA: Diagnosis not present

## 2023-10-31 DIAGNOSIS — R739 Hyperglycemia, unspecified: Secondary | ICD-10-CM | POA: Diagnosis not present

## 2023-10-31 DIAGNOSIS — E785 Hyperlipidemia, unspecified: Secondary | ICD-10-CM

## 2023-10-31 DIAGNOSIS — F909 Attention-deficit hyperactivity disorder, unspecified type: Secondary | ICD-10-CM | POA: Diagnosis not present

## 2023-10-31 MED ORDER — SERTRALINE HCL 25 MG PO TABS: 25.0000 mg | ORAL_TABLET | Freq: Every day | ORAL | 1 refills | Status: DC

## 2023-10-31 MED ORDER — AMPHETAMINE-DEXTROAMPHETAMINE 10 MG PO TABS: 10.0000 mg | ORAL_TABLET | Freq: Two times a day (BID) | ORAL | 0 refills | Status: DC

## 2023-10-31 NOTE — Progress Notes (Signed)
 MyChart Video Visit    Virtual Visit via Video Note   This patient is at least at moderate risk for complications without adequate follow up. This format is felt to be most appropriate for this patient at this time. Physical exam was limited by quality of the video and audio technology used for the visit. [name], CMA was able to get the patient set up on a video visit.  Patient location: home Patient and provider in visit Provider location: Office  I discussed the limitations of evaluation and management by telemedicine and the availability of in person appointments. The patient expressed understanding and agreed to proceed.  Visit Date: 10/31/2023  Today's healthcare provider: Randie Bustle, MD     Subjective:    Patient ID: Sandra Long, female    DOB: 10-Feb-1976, 48 y.o.   MRN: [id number]  Chief Complaint  Patient presents with   Medical Management of Chronic Issues    Patient presents today for a 3 month follow-up    HPI Discussed the use of AI scribe software for clinical note transcription with the patient, who gave verbal consent to proceed.  History of Present Illness Sandra Long is a 48 year old female who presents for follow-up on blood sugar management and medication adjustments.  Her hemoglobin A1c has decreased from 6.2% to 6.0%, which she attributes to significant effort in managing her blood sugar levels. She monitors her blood sugar and notes higher levels before her menstrual cycle, which decrease after it starts. She used a continuous glucose monitor but removed it about four weeks ago. She observes higher blood sugar levels between ovulation and the start of her cycle.  She has been taking sertraline  but ran out three days ago. She is uncertain about its effectiveness, particularly for hormonal symptoms and anxiety. She is contemplating taking half a dose but is unsure of its impact.  She is currently on Vyvanse  but suspects it may elevate her blood  sugar levels. She often goes long periods without eating due to decreased appetite, which she believes contributes to blood sugar fluctuations. She has started eating breakfast and snacks regularly to manage this.  She is considering switching from Vyvanse  to a short-acting Adderall to see if it affects her appetite and blood sugar levels differently. She has previously tried both Ritalin  and extended-release Adderall.  Her mother was recently diagnosed with celiac disease, but she is not experiencing any related symptoms herself.    Past Medical History:  Diagnosis Date   Anemia 06/07/2016   Cervical disc disease 11/25/2014   MRI 2014  1.  Moderate left central and foraminal stenosis due to a large left lateral recess disc protrusion at C6-7, potentially with a small amount of marginal blood products - correlate with chronicity of onset of patient's symptoms. 2.  There is also moderate right eccentric central stenosis at the C5-6 level due to a right paracentral disc protrusion. 3.  Chronic ethmoid sinusitis. 4.  Mild    Dog bite 10/20/2016   Dyslipidemia 06/07/2016   Eye disease 12/07/2014   Macular rosacea   Gestational diabetes 2009   Hyperglycemia 06/07/2016   Palpitations 11/25/2014   Paresthesia 11/25/2014   Preventative health care 12/07/2014    Past Surgical History:  Procedure Laterality Date   CESAREAN SECTION  2007   CESAREAN SECTION N/A 09/08/2015   Procedure: Repeat CESAREAN SECTION;  Surgeon: Meriam Stamp, MD;  Location: [facility];  Service: Obstetrics;  Laterality: N/A;  EDD: 09/14/15  WISDOM TOOTH EXTRACTION      Family History  Problem Relation Age of Onset   Heart disease Mother        2 stents 2015   Tremor Mother        benign essential   Colon polyps Mother 47   Pernicious anemia Mother    Heart disease Father        stents   Diabetes Father        59   Stroke Father    Gout Father    Blindness Father    Depression Brother    Anxiety disorder  Brother    Gout Brother    Other Brother        gout   Depression Brother    Anxiety disorder Brother    Depression Son    Anxiety disorder Son    Cancer Maternal Grandfather        Multiple Myeloma   Stroke Paternal Grandmother    Cancer Paternal Grandfather        bladder cancer   Esophageal cancer Neg Hx    Colon cancer Neg Hx    Rectal cancer Neg Hx    Stomach cancer Neg Hx     Social History   Socioeconomic History   Marital status: Married    Spouse name: Not on file   Number of children: Not on file   Years of education: Not on file   Highest education level: Not on file  Occupational History   Occupation: RN [employer]  Tobacco Use   Smoking status: Never   Smokeless tobacco: Never  Vaping Use   Vaping status: Never Used  Substance and Sexual Activity   Alcohol use: Yes    Alcohol/week: 4.0 standard drinks of alcohol    Types: 4 Glasses of wine per week   Drug use: No   Sexual activity: Yes    Comment: lives with husband, no red meat, works as Charity fundraiser at [employer]  Other Topics Concern   Not on file  Social History Narrative   Not on file   Social Drivers of Corporate investment banker Strain: Not on file  Food Insecurity: Not on file  Transportation Needs: Not on file  Physical Activity: Not on file  Stress: Not on file  Social Connections: Not on file  Intimate Partner Violence: Not on file    Outpatient Medications Prior to Visit  Medication Sig Dispense Refill   amitriptyline  (ELAVIL ) 10 MG tablet TAKE 0.5-2 TABLETS (5-20 MG TOTAL) BY MOUTH AT BEDTIME. 180 tablet 1   Continuous Glucose Receiver (FREESTYLE LIBRE 3 READER) DEVI 1 each by Does not apply route as directed. 1 each 0   Continuous Glucose Sensor (FREESTYLE LIBRE 3 PLUS SENSOR) MISC Change sensor every 15 days. 2 each 5   lisdexamfetamine (VYVANSE ) 30 MG capsule Take 1 capsule (30 mg total) by mouth daily. March 2025 30 capsule 0   lisdexamfetamine (VYVANSE )  30 MG capsule Take 1 capsule (30 mg total) by mouth daily. April 2025 30 capsule 0   lisdexamfetamine (VYVANSE ) 30 MG capsule Take 1 capsule (30 mg total) by mouth daily. February 2025 30 capsule 0   valACYclovir  (VALTREX ) 1000 MG tablet Take 1 tablet (1,000 mg total) by mouth daily as needed. 90 tablet 1   sertraline  (ZOLOFT ) 25 MG tablet Take 1 tablet (25 mg total) by mouth daily. 90 tablet 0   No facility-administered medications prior to visit.  Allergies  Allergen Reactions   No Known Allergies     Review of Systems  Constitutional:  Negative for fever and malaise/fatigue.  HENT:  Negative for congestion.   Eyes:  Negative for blurred vision.  Respiratory:  Negative for shortness of breath.   Cardiovascular:  Negative for chest pain, palpitations and leg swelling.  Gastrointestinal:  Negative for abdominal pain, blood in stool and nausea.  Genitourinary:  Negative for dysuria and frequency.  Musculoskeletal:  Negative for falls.  Skin:  Negative for rash.  Neurological:  Negative for dizziness, loss of consciousness and headaches.  Endo/Heme/Allergies:  Negative for environmental allergies.  Psychiatric/Behavioral:  Negative for depression. The patient is not nervous/anxious.        Objective:    Physical Exam Constitutional:      General: She is not in acute distress.    Appearance: Normal appearance. She is not ill-appearing or toxic-appearing.  HENT:     Head: Normocephalic and atraumatic.     Right Ear: External ear normal.     Left Ear: External ear normal.     Nose: Nose normal.  Eyes:     General:        Right eye: No discharge.        Left eye: No discharge.  Pulmonary:     Effort: Pulmonary effort is normal.  Skin:    Findings: No rash.  Neurological:     Mental Status: She is alert and oriented to person, place, and time.  Psychiatric:        Behavior: Behavior normal.     Breastfeeding No  Wt Readings from Last 3 Encounters:  07/17/23 125 lb  9.6 oz (57 kg)  07/11/22 118 lb (53.5 kg)  07/01/22 121 lb (54.9 kg)       Assessment & Plan:  Anxiety Assessment & Plan: Tolerating Sertraline    Orders: -     TSH; Future  Attention deficit hyperactivity disorder (ADHD), unspecified ADHD type Assessment & Plan: Tolerating Vyvanse .    Dyslipidemia Assessment & Plan: Encourage heart healthy diet such as MIND or DASH diet, increase exercise, avoid trans fats, simple carbohydrates and processed foods, consider a krill or fish or flaxseed oil cap daily.    Orders: -     Lipid panel; Future -     TSH; Future  Hyperglycemia Assessment & Plan: hgba1c acceptable, minimize simple carbs. Increase exercise as tolerated.   Orders: -     Comprehensive metabolic panel with GFR; Future -     TSH; Future -     Hemoglobin A1c; Future  Other orders -     Sertraline  HCl; Take 1 tablet (25 mg total) by mouth daily.  Dispense: 90 tablet; Refill: 1 -     Amphetamine -Dextroamphetamine; Take 1 tablet (10 mg total) by mouth 2 (two) times daily with a meal.  Dispense: 60 tablet; Refill: 0     Assessment and Plan Assessment & Plan Prediabetes A1c improved from 6.2 to 6.0. Discussed hormonal influences on metabolism and risks of hormone therapy. Decision to monitor A1c for 3 months before considering hormone therapy. - Order A1c and metabolic panel for July 29 or later. - Continue current lifestyle modifications.  Hyperglycemia Blood sugar fluctuations noted, possibly influenced by Vyvanse  and menstrual cycles. Emphasized regular meals with protein to stabilize glucose. - Continue monitoring with CGM. - Order more CGM monitors for [facility]. - Encourage regular meals with protein every 3-4 hours.  ADHD, unspecified type Vyvanse  effective but may affect  glucose. Trial of short-acting Adderall to assess impact on appetite and glucose levels. - Prescribe short-acting Adderall 10 mg BID for one month. Report effectiveness and side effects. -  Discontinue Vyvanse .  Anxiety On Sertraline  25 mg daily. Uncertain effectiveness for hormonal-related anxiety. Discussed Paxil but prefers to continue Zoloft . - Refill Sertraline  25 mg for [facility].     I discussed the assessment and treatment plan with the patient. The patient was provided an opportunity to ask questions and all were answered. The patient agreed with the plan and demonstrated an understanding of the instructions.   The patient was advised to call back or seek an in-person evaluation if the symptoms worsen or if the condition fails to improve as anticipated.  Randie Bustle, MD [facility] [phone] (phone) [phone] (fax)  [facility]

## 2023-10-31 NOTE — Telephone Encounter (Signed)
 Copied from CRM [id number]. Topic: Appointments - Appointment Info/Confirmation >> Oct 31, 2023  9:23 AM Juleen Oakland F wrote: Patient has video visit with Dr. Rodrick Clapper today and says the video link that was texted to her expired - she requested that another link be texted to her   Patient will be called close time to her visit.

## 2023-11-03 ENCOUNTER — Other Ambulatory Visit: Payer: Self-pay | Admitting: Family Medicine

## 2024-01-17 NOTE — Assessment & Plan Note (Signed)
 hgba1c acceptable, minimize simple carbs. Increase exercise as tolerated.

## 2024-01-17 NOTE — Assessment & Plan Note (Signed)
 Encourage heart healthy diet such as MIND or DASH diet, increase exercise, avoid trans fats, simple carbohydrates and processed foods, consider a krill or fish or flaxseed oil cap daily.

## 2024-01-17 NOTE — Assessment & Plan Note (Signed)
Tolerating Vyvanse

## 2024-01-18 ENCOUNTER — Ambulatory Visit: Payer: 59 | Admitting: Family Medicine

## 2024-01-18 ENCOUNTER — Telehealth (INDEPENDENT_AMBULATORY_CARE_PROVIDER_SITE_OTHER): Admitting: Family Medicine

## 2024-01-18 DIAGNOSIS — E785 Hyperlipidemia, unspecified: Secondary | ICD-10-CM

## 2024-01-18 DIAGNOSIS — F909 Attention-deficit hyperactivity disorder, unspecified type: Secondary | ICD-10-CM

## 2024-01-18 DIAGNOSIS — R739 Hyperglycemia, unspecified: Secondary | ICD-10-CM

## 2024-01-18 DIAGNOSIS — D721 Eosinophilia, unspecified: Secondary | ICD-10-CM | POA: Diagnosis not present

## 2024-01-18 MED ORDER — LISDEXAMFETAMINE DIMESYLATE 30 MG PO CAPS: 30.0000 mg | ORAL_CAPSULE | Freq: Every day | ORAL | 0 refills | Status: AC

## 2024-01-18 MED ORDER — LISDEXAMFETAMINE DIMESYLATE 30 MG PO CAPS: 30.0000 mg | ORAL_CAPSULE | Freq: Every day | ORAL | 0 refills | Status: DC

## 2024-01-21 ENCOUNTER — Encounter: Payer: Self-pay | Admitting: Family Medicine

## 2024-01-21 NOTE — Progress Notes (Signed)
 MyChart Video Visit    Virtual Visit via Video Note   This patient is at least at moderate risk for complications without adequate follow up. This format is felt to be most appropriate for this patient at this time. Physical exam was limited by quality of the video and audio technology used for the visit. [name], CMA was able to get the patient set up on a video visit.  Patient location: home Patient and provider in visit Provider location: Office  I discussed the limitations of evaluation and management by telemedicine and the availability of in person appointments. The patient expressed understanding and agreed to proceed.  Visit Date: 01/18/2024  Today's healthcare provider: Harlene Horton, MD     Subjective:    Patient ID: Sandra Long, female    DOB: August 31, 1975, 48 y.o.   MRN: [id number]  Chief Complaint  Patient presents with   Medical Management of Chronic Issues    Patient presents today for ADHD follow-up. She was changed from Vyvanse  to Adderall.    HPI Discussed the use of AI scribe software for clinical note transcription with the patient, who gave verbal consent to proceed.  History of Present Illness Sandra Long is a 48 year old female with ADHD and prediabetes who presents for medication management and follow-up on blood sugar levels.  She has been managing her ADHD with Adderall after switching from Vyvanse . She finds Vyvanse  more effective, providing a steady effect, while Adderall requires a second dose at noon. She prefers Vyvanse  as it does not disrupt her sleep as much. Currently, she is on a low dose of Vyvanse  (30 mg) and also takes Zoloft , which she feels helps with mood stabilization.  Regarding her prediabetes, she has not been wearing her glucose monitor and has been inconsistent with her diet and exercise since her last A1c test, which was 6.3. She is concerned about a potential increase in her A1c. She acknowledges the importance of eating  breakfast and mentions that protein intake helps stabilize her blood sugar levels. Her diet includes eggs and cheese sticks.  She mentions having a genetic factor related to health. Personal stressors, including her son's transition to college, have impacted her ability to maintain her exercise routine and dietary habits.  She is currently taking Zoloft  and has considered discontinuing it but decided against it due to current stressors. She is contemplating hormone replacement therapy for perimenopausal symptoms and its impact on insulin  resistance but has not made any changes yet.    Past Medical History:  Diagnosis Date   Anemia 06/07/2016   Cervical disc disease 11/25/2014   MRI 2014  1.  Moderate left central and foraminal stenosis due to a large left lateral recess disc protrusion at C6-7, potentially with a small amount of marginal blood products - correlate with chronicity of onset of patient's symptoms. 2.  There is also moderate right eccentric central stenosis at the C5-6 level due to a right paracentral disc protrusion. 3.  Chronic ethmoid sinusitis. 4.  Mild    Dog bite 10/20/2016   Dyslipidemia 06/07/2016   Eye disease 12/07/2014   Macular rosacea   Gestational diabetes 2009   Hyperglycemia 06/07/2016   Palpitations 11/25/2014   Paresthesia 11/25/2014   Preventative health care 12/07/2014    Past Surgical History:  Procedure Laterality Date   CESAREAN SECTION  2007   CESAREAN SECTION N/A 09/08/2015   Procedure: Repeat CESAREAN SECTION;  Surgeon: Charlie Flowers, MD;  Location: [facility];  Service: Obstetrics;  Laterality: N/A;  EDD: 09/14/15   WISDOM TOOTH EXTRACTION      Family History  Problem Relation Age of Onset   Heart disease Mother        2 stents 2015   Tremor Mother        benign essential   Colon polyps Mother 3   Pernicious anemia Mother    Heart disease Father        stents   Diabetes Father        36   Stroke Father    Gout Father    Blindness  Father    Depression Brother    Anxiety disorder Brother    Gout Brother    Other Brother        gout   Depression Brother    Anxiety disorder Brother    Depression Son    Anxiety disorder Son    Cancer Maternal Grandfather        Multiple Myeloma   Stroke Paternal Grandmother    Cancer Paternal Grandfather        bladder cancer   Esophageal cancer Neg Hx    Colon cancer Neg Hx    Rectal cancer Neg Hx    Stomach cancer Neg Hx     Social History   Socioeconomic History   Marital status: Married    Spouse name: Not on file   Number of children: Not on file   Years of education: Not on file   Highest education level: Not on file  Occupational History   Occupation: RN [employer]  Tobacco Use   Smoking status: Never   Smokeless tobacco: Never  Vaping Use   Vaping status: Never Used  Substance and Sexual Activity   Alcohol use: Yes    Alcohol/week: 4.0 standard drinks of alcohol    Types: 4 Glasses of wine per week   Drug use: No   Sexual activity: Yes    Comment: lives with husband, no red meat, works as Charity fundraiser at [employer]  Other Topics Concern   Not on file  Social History Narrative   Not on file   Social Drivers of Corporate investment banker Strain: Not on file  Food Insecurity: Not on file  Transportation Needs: Not on file  Physical Activity: Not on file  Stress: Not on file  Social Connections: Not on file  Intimate Partner Violence: Not on file    Outpatient Medications Prior to Visit  Medication Sig Dispense Refill   amitriptyline  (ELAVIL ) 10 MG tablet TAKE 0.5-2 TABLETS (5-20 MG TOTAL) BY MOUTH AT BEDTIME. 180 tablet 1   Continuous Glucose Receiver (FREESTYLE LIBRE 3 READER) DEVI 1 each by Does not apply route as directed. 1 each 0   Continuous Glucose Sensor (FREESTYLE LIBRE 3 SENSOR) MISC USE AS DIRECTED 2 each 5   sertraline  (ZOLOFT ) 25 MG tablet Take 1 tablet (25 mg total) by mouth daily. 90 tablet 1   valACYclovir   (VALTREX ) 1000 MG tablet Take 1 tablet (1,000 mg total) by mouth daily as needed. 90 tablet 1   amphetamine -dextroamphetamine  (ADDERALL) 10 MG tablet Take 1 tablet (10 mg total) by mouth 2 (two) times daily with a meal. 60 tablet 0   lisdexamfetamine (VYVANSE ) 30 MG capsule Take 1 capsule (30 mg total) by mouth daily. March 2025 (Patient not taking: Reported on 01/18/2024) 30 capsule 0   lisdexamfetamine (VYVANSE ) 30 MG capsule Take 1 capsule (30 mg total) by  mouth daily. April 2025 (Patient not taking: Reported on 01/18/2024) 30 capsule 0   lisdexamfetamine (VYVANSE ) 30 MG capsule Take 1 capsule (30 mg total) by mouth daily. February 2025 (Patient not taking: Reported on 01/18/2024) 30 capsule 0   No facility-administered medications prior to visit.    Allergies  Allergen Reactions   No Known Allergies     Review of Systems  Constitutional:  Negative for fever and malaise/fatigue.  HENT:  Negative for congestion.   Eyes:  Negative for blurred vision.  Respiratory:  Negative for shortness of breath.   Cardiovascular:  Negative for chest pain, palpitations and leg swelling.  Gastrointestinal:  Negative for abdominal pain, blood in stool and nausea.  Genitourinary:  Negative for dysuria and frequency.  Musculoskeletal:  Negative for falls.  Skin:  Negative for rash.  Neurological:  Negative for dizziness, loss of consciousness and headaches.  Endo/Heme/Allergies:  Negative for environmental allergies.  Psychiatric/Behavioral:  Negative for depression. The patient is nervous/anxious.        Objective:    Physical Exam Constitutional:      General: She is not in acute distress.    Appearance: Normal appearance. She is not ill-appearing or toxic-appearing.  HENT:     Head: Normocephalic and atraumatic.     Right Ear: External ear normal.     Left Ear: External ear normal.     Nose: Nose normal.  Eyes:     General:        Right eye: No discharge.        Left eye: No discharge.   Pulmonary:     Effort: Pulmonary effort is normal.  Skin:    Findings: No rash.  Neurological:     Mental Status: She is alert and oriented to person, place, and time.  Psychiatric:        Behavior: Behavior normal.     There were no vitals taken for this visit. Wt Readings from Last 3 Encounters:  07/17/23 125 lb 9.6 oz (57 kg)  07/11/22 118 lb (53.5 kg)  07/01/22 121 lb (54.9 kg)       Assessment & Plan:  Dyslipidemia Assessment & Plan: Encourage heart healthy diet such as MIND or DASH diet, increase exercise, avoid trans fats, simple carbohydrates and processed foods, consider a krill or fish or flaxseed oil cap daily.    Orders: -     Lipid panel -     Comprehensive metabolic panel with GFR -     TSH  Attention deficit hyperactivity disorder (ADHD), unspecified ADHD type Assessment & Plan: Tolerating Vyvanse .    Hyperglycemia Assessment & Plan: hgba1c acceptable, minimize simple carbs. Increase exercise as tolerated.   Orders: -     Hemoglobin A1c -     TSH  Eosinophilia, unspecified type -     CBC with Differential/Platelet  Other orders -     Lisdexamfetamine Dimesylate ; Take 1 capsule (30 mg total) by mouth daily. July 2025  Dispense: 30 capsule; Refill: 0 -     Lisdexamfetamine Dimesylate ; Take 1 capsule (30 mg total) by mouth daily. August 2025  Dispense: 30 capsule; Refill: 0 -     Lisdexamfetamine Dimesylate ; Take 1 capsule (30 mg total) by mouth daily. Sept 2025  Dispense: 30 capsule; Refill: 0     Assessment and Plan Assessment & Plan Attention Deficit Hyperactivity Disorder (ADHD) Vyvanse  provides a more steady effect and better symptom control than Adderall. No significant difference in risk profiles between medications. - Switch  to Vyvanse  30 mg daily. - Discontinue Adderall or retain for future use.  Anxiety Sertraline  25 mg daily provides a leveling effect. Advised against discontinuation during medication transition. - Continue  Sertraline  25 mg daily. - If discontinuing, taper to half a tablet daily for 1-2 weeks before stopping.  Prediabetes A1c at 6.3. Lapse in diet and exercise noted. Emphasized diet and exercise to prevent A1c rise above 6.5. Discussed HRT risks if lifestyle changes fail. - Order lab work in one month to check A1c and other parameters. - Emphasize protein intake, especially for breakfast. - Consider hormone replacement therapy if A1c rises and lifestyle changes are insufficient.  General Health Maintenance Inconsistent exercise routine. Emphasized regular physical activity and balanced diet for health management. - Encourage regular exercise. - Maintain a balanced diet with adequate protein intake.     I discussed the assessment and treatment plan with the patient. The patient was provided an opportunity to ask questions and all were answered. The patient agreed with the plan and demonstrated an understanding of the instructions.   The patient was advised to call back or seek an in-person evaluation if the symptoms worsen or if the condition fails to improve as anticipated.  Harlene Horton, MD [facility] at [facility] [phone] (phone) [phone] (fax)  [facility]

## 2024-02-23 ENCOUNTER — Other Ambulatory Visit

## 2024-03-01 ENCOUNTER — Other Ambulatory Visit

## 2024-03-14 ENCOUNTER — Other Ambulatory Visit (HOSPITAL_BASED_OUTPATIENT_CLINIC_OR_DEPARTMENT_OTHER): Payer: Self-pay

## 2024-03-15 ENCOUNTER — Ambulatory Visit: Payer: Self-pay | Admitting: Family Medicine

## 2024-03-15 ENCOUNTER — Other Ambulatory Visit (INDEPENDENT_AMBULATORY_CARE_PROVIDER_SITE_OTHER)

## 2024-03-15 DIAGNOSIS — E785 Hyperlipidemia, unspecified: Secondary | ICD-10-CM

## 2024-03-15 DIAGNOSIS — F419 Anxiety disorder, unspecified: Secondary | ICD-10-CM | POA: Diagnosis not present

## 2024-03-15 DIAGNOSIS — R739 Hyperglycemia, unspecified: Secondary | ICD-10-CM

## 2024-03-15 LAB — COMPREHENSIVE METABOLIC PANEL WITH GFR
ALT: 19 U/L (ref 0–35)
AST: 21 U/L (ref 0–37)
Albumin: 4 g/dL (ref 3.5–5.2)
Alkaline Phosphatase: 35 U/L — ABNORMAL LOW (ref 39–117)
BUN: 17 mg/dL (ref 6–23)
CO2: 27 meq/L (ref 19–32)
Calcium: 9.3 mg/dL (ref 8.4–10.5)
Chloride: 108 meq/L (ref 96–112)
Creatinine, Ser: 0.57 mg/dL (ref 0.40–1.20)
GFR: 107.99 mL/min (ref >60.00)
Glucose, Bld: 101 mg/dL — ABNORMAL HIGH (ref 70–99)
Potassium: 5 meq/L (ref 3.5–5.1)
Sodium: 140 meq/L (ref 135–145)
Total Bilirubin: 0.5 mg/dL (ref 0.2–1.2)
Total Protein: 6.4 g/dL (ref 6.0–8.3)

## 2024-03-15 LAB — LIPID PANEL
Cholesterol: 129 mg/dL (ref 0–200)
HDL: 42.8 mg/dL (ref >39.00)
LDL Cholesterol: 76 mg/dL (ref 0–99)
NonHDL: 86.27
Total CHOL/HDL Ratio: 3
Triglycerides: 49 mg/dL (ref 0.0–149.0)
VLDL: 9.8 mg/dL (ref 0.0–40.0)

## 2024-03-15 LAB — TSH: TSH: 2.22 u[IU]/mL (ref 0.35–5.50)

## 2024-03-15 LAB — HEMOGLOBIN A1C: Hgb A1c MFr Bld: 6.2 % (ref 4.6–6.5)

## 2024-03-15 NOTE — Progress Notes (Signed)
 Patient reviewed via MyChart.

## 2024-04-27 ENCOUNTER — Other Ambulatory Visit: Payer: Self-pay | Admitting: Family Medicine

## 2024-05-20 ENCOUNTER — Ambulatory Visit: Admitting: Family Medicine

## 2024-06-26 ENCOUNTER — Other Ambulatory Visit: Payer: Self-pay | Admitting: Family Medicine

## 2024-06-26 NOTE — Telephone Encounter (Unsigned)
 Copied from CRM #[id number]. Topic: Clinical - Medication Refill >> Jun 26, 2024  2:37 PM [facility] ORN wrote: Medication: lisdexamfetamine  (VYVANSE ) 30 MG capsule  Has the patient contacted their pharmacy? Yes (Agent: If no, request that the patient contact the pharmacy for the refill. If patient does not wish to contact the pharmacy document the reason why and proceed with request.) (Agent: If yes, when and what did the pharmacy advise?)  This is the patient's preferred pharmacy:  [id number] - [place], [place] - [address] [place] [address] [place] [address] [place] [address] Phone: [phone] Fax: [phone]  Is this the correct pharmacy for this prescription? Yes If no, delete pharmacy and type the correct one.   Has the prescription been filled recently? No  Is the patient out of the medication? Yes  Has the patient been seen for an appointment in the last year OR does the patient have an upcoming appointment? Yes  Can we respond through MyChart? Yes  Agent: Please be advised that Rx refills may take up to 3 business days. We ask that you follow-up with your pharmacy.

## 2024-06-28 MED ORDER — LISDEXAMFETAMINE DIMESYLATE 30 MG PO CAPS: 30.0000 mg | ORAL_CAPSULE | Freq: Every day | ORAL | 0 refills | Status: AC

## 2024-06-28 NOTE — Telephone Encounter (Signed)
 Requesting: Vyvanse  30mg  Contract: Yes UDS: 07/17/23 Last Visit: 07/17/2023 Next Visit: Visit date not found Last Refill: 01/18/24 for 3 months  Message sent to pt to schedule 6 month follow up  Please Advise
